# Patient Record
Sex: Male | Born: 1950 | Race: White | Hispanic: No | Marital: Married | Smoking: Never smoker
Health system: Southern US, Academic
[De-identification: ages and names within clinical notes are randomized; demographics above are authoritative.]

## PROBLEM LIST (undated history)

## (undated) DIAGNOSIS — E559 Vitamin D deficiency, unspecified: Secondary | ICD-10-CM

## (undated) DIAGNOSIS — F411 Generalized anxiety disorder: Secondary | ICD-10-CM

## (undated) DIAGNOSIS — E119 Type 2 diabetes mellitus without complications: Secondary | ICD-10-CM

## (undated) DIAGNOSIS — M159 Polyosteoarthritis, unspecified: Secondary | ICD-10-CM

## (undated) DIAGNOSIS — I10 Essential (primary) hypertension: Secondary | ICD-10-CM

## (undated) DIAGNOSIS — R55 Syncope and collapse: Secondary | ICD-10-CM

## (undated) DIAGNOSIS — E785 Hyperlipidemia, unspecified: Secondary | ICD-10-CM

## (undated) DIAGNOSIS — G4700 Insomnia, unspecified: Secondary | ICD-10-CM

## (undated) DIAGNOSIS — F32A Depression, unspecified: Secondary | ICD-10-CM

## (undated) DIAGNOSIS — R972 Elevated prostate specific antigen [PSA]: Secondary | ICD-10-CM

## (undated) DIAGNOSIS — E291 Testicular hypofunction: Secondary | ICD-10-CM

## (undated) DIAGNOSIS — N400 Enlarged prostate without lower urinary tract symptoms: Secondary | ICD-10-CM

## (undated) HISTORY — DX: Hemochromatosis, unspecified: E83.119

## (undated) HISTORY — DX: Generalized anxiety disorder: F41.1

## (undated) HISTORY — DX: Benign prostatic hyperplasia without lower urinary tract symptoms: N40.0

## (undated) HISTORY — PX: FINGER SURGERY: SHX640

## (undated) HISTORY — DX: Polyosteoarthritis, unspecified: M15.9

## (undated) HISTORY — DX: Insomnia, unspecified: G47.00

## (undated) HISTORY — DX: Type 2 diabetes mellitus without complications: E11.9

## (undated) HISTORY — DX: Depression, unspecified: F32.A

## (undated) HISTORY — DX: Vitamin D deficiency, unspecified: E55.9

## (undated) HISTORY — DX: Hypomagnesemia: E83.42

## (undated) HISTORY — DX: Testicular hypofunction: E29.1

## (undated) HISTORY — PX: HAND SURGERY: SHX662

## (undated) HISTORY — DX: Essential (primary) hypertension: I10

## (undated) HISTORY — DX: Hyperlipidemia, unspecified: E78.5

## (undated) HISTORY — DX: Elevated prostate specific antigen (PSA): R97.20

## (undated) HISTORY — PX: HX APPENDECTOMY: SHX54

## (undated) HISTORY — DX: Syncope and collapse: R55

---

## 1999-10-06 ENCOUNTER — Other Ambulatory Visit (HOSPITAL_COMMUNITY): Payer: Self-pay

## 2017-06-23 HISTORY — PX: CYSTOSCOPY: SUR368

## 2020-09-17 LAB — ENTER/EDIT EXTERNAL COMMON LAB RESULTS
CHOLESTEROL: 65
HDL-CHOLESTEROL: 39
HEMOGLOBIN A1C: 6.4
LDL (CALCULATED): 102
LDL CHOLESTEROL,DIRECT: 24
TRIGLYCERIDES: 122

## 2020-10-10 ENCOUNTER — Encounter (INDEPENDENT_AMBULATORY_CARE_PROVIDER_SITE_OTHER): Payer: Self-pay

## 2020-10-17 ENCOUNTER — Encounter (INDEPENDENT_AMBULATORY_CARE_PROVIDER_SITE_OTHER): Payer: Medicare Other | Admitting: Hematology & Oncology

## 2020-10-22 ENCOUNTER — Encounter (INDEPENDENT_AMBULATORY_CARE_PROVIDER_SITE_OTHER): Payer: Self-pay | Admitting: Hematology & Oncology

## 2020-10-22 ENCOUNTER — Other Ambulatory Visit: Payer: Self-pay

## 2020-10-22 ENCOUNTER — Ambulatory Visit (INDEPENDENT_AMBULATORY_CARE_PROVIDER_SITE_OTHER): Payer: Medicare Other | Admitting: Hematology & Oncology

## 2020-10-22 NOTE — Nursing Note (Signed)
Here for follow up no issues

## 2020-10-23 NOTE — Cancer Center Note (Addendum)
HEMATOLOGY/ONCOLOGY, [facility], [facility]   [address]  [place] [address]  [facility]     Progress Note    Name: Steven Henderson MRN:  [id number]   Date: 10/22/2020 DOB: 10-Jul-1950       Referring Physician: Rogers Seeds, MD  Primary Care Provider: Eduard Clos, DO    Chief Complaint   Patient presents with   . Follow Up     hemochromatosis       Subjective:  Hematologic follow-up of patient with hemochromatosis.  The patient is on Q 3 month evaluation for phlebotomies.  His last phlebotomy February 2022.  He is doing well.    ROS:     Review of Systems   All other systems reviewed and are negative.       Objective:   BP (!) 159/80 (Site: Right, Patient Position: Sitting, Cuff Size: Adult)   Pulse 64   Temp 36.7 C (98.1 F) (Skin)   Ht 1.727 m (5\' 8" )   Wt 98.9 kg (218 lb)   BMI 33.15 kg/m       Physical Exam  Vitals reviewed.   Constitutional:       Appearance: Normal appearance.   HENT:      Nose: Nose normal.   Eyes:      Extraocular Movements: Extraocular movements intact.      Conjunctiva/sclera: Conjunctivae normal.      Pupils: Pupils are equal, round, and reactive to light.   Cardiovascular:      Rate and Rhythm: Normal rate and regular rhythm.      Pulses: Normal pulses.      Heart sounds: Normal heart sounds.   Pulmonary:      Effort: Pulmonary effort is normal.      Breath sounds: Normal breath sounds.   Abdominal:      General: Abdomen is flat. Bowel sounds are normal.      Palpations: Abdomen is soft.   Musculoskeletal:         General: Normal range of motion.      Cervical back: Neck supple.   Skin:     General: Skin is warm and dry.   Neurological:      General: No focal deficit present.      Mental Status: He is alert and oriented to person, place, and time. Mental status is at baseline.   Psychiatric:         Mood and Affect: Mood normal.          ECOG Status: 0 - Fully active, able to carry on all  pre-disease performance without restriction.       Current Outpatient Medications   Medication Sig   . amLODIPine (NORVASC) 10 mg Oral Tablet    . ergocalciferol, vitamin D2, (DRISDOL) 1,250 mcg (50,000 unit) Oral Capsule    . finasteride (PROSCAR) 5 mg Oral Tablet    . glimepiride (AMARYL) 2 mg Oral Tablet    . meloxicam (MOBIC) 15 mg Oral Tablet    . metFORMIN (GLUCOPHAGE XR) 500 mg Oral Tablet Sustained Release 24 hr    . metoprolol succinate (TOPROL-XL) 50 mg Oral Tablet Sustained Release 24 hr    . naproxen (NAPROSYN) 500 mg Oral Tablet    . olmesartan (BENICAR) 40 mg Oral Tablet    . tamsulosin (FLOMAX) 0.4 mg Oral Capsule      Allergies as of 10/22/2020   . (No Known Allergies)  Labs:  07/09/2020 we have electrolytes within normal limits BUN of 18 creatinine 1.1 iron of 62  Saturation of 15% ferritin of 65  White count 7300 hemoglobin of 11.1 hematocrit 33.2 and a platelet count of 238000  Alpha fetoprotein of 2.4    Radiology:       Assessment:     1. 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 BPH    3. Hypertension      Plan:     The patient will continue to have CBC liver profile iron studies ferritin q.3 months with instruction to have phlebotomy 500 cc of whole blood for ferritin greater than 50. He needs a sonogram of the liver.  I will see him back in 6 months .  He was given the request forms for all the aforementioned lab in series.      A total of 20 minutes was spent on this consultation with >50% spent in education, counseling of patient on diagnosis and treatment and in coordination of care.  All questions presented by patient on this visit were satisfactorily answered.      Return in about 6 months (around 04/23/2021).    Storm Frisk, MD      CC:  PCP General:  Eduard Clos, DO  [address]  [address]  [place] [address]      Portions of this note may be dictated using voice recognition software or a dictation service. Variances in spelling and vocabulary are possible and unintentional. Not all errors are caught/corrected. Please notify the Thereasa Parkin if any discrepancies are noted or if the meaning of any statement is not clear.

## 2020-11-01 ENCOUNTER — Telehealth (INDEPENDENT_AMBULATORY_CARE_PROVIDER_SITE_OTHER): Payer: Self-pay | Admitting: Hematology & Oncology

## 2020-11-01 NOTE — Telephone Encounter (Signed)
LM on VM that US liver is negative except for fatty changes

## 2021-04-07 LAB — ENTER/EDIT EXTERNAL COMMON LAB RESULTS: PROSTATE SPECIFIC AG: 17.85

## 2021-04-23 ENCOUNTER — Ambulatory Visit (INDEPENDENT_AMBULATORY_CARE_PROVIDER_SITE_OTHER): Payer: Medicare Other

## 2021-04-23 ENCOUNTER — Other Ambulatory Visit: Payer: Self-pay

## 2021-04-23 ENCOUNTER — Encounter (INDEPENDENT_AMBULATORY_CARE_PROVIDER_SITE_OTHER): Payer: Self-pay

## 2021-04-23 NOTE — H&P (Signed)
[facility]       CANCER CENTER NOTE     Date: 04/23/2021  Name: Steven Henderson  MRN: [id number]  Referring Physician: Netty Starring, MD  Primary Care Provider: Adriana Mccallum, DO    REASON FOR VISIT:   Chief Complaint   Patient presents with    Hemachromatosis        HISTORY OF PRESENT ILLNESS:   70 y.o.male from [place] [id number] for evaluation and management of hemochromatosis.  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    He has been undergoing phlebotomy treatments which he has been tolerating very well.  Comes today for his routine blood work assessment.    His last phlebotomy was in February 2022 which she tolerated really well.  He did not need a phlebotomy after that because of low ferritin.    States that recently his PCP checked PSA level which was elevated at 17.  He was referred to Urology Dr. Anette Guarneri who saw him and ordered pelvic MRI.  The pelvic MRI is scheduled to be done in a week.  He will follow-up with Dr. Anette Guarneri on 21st of December to talk over the results of MRI.  States that for the last year has been having problems with urinary incontinence and frequent urination at night.  He has to wake up twice a night to use the restroom.  Complains of slow urinary stream.     REVIEW OF SYSTEMS:  Pertinent review of systems as discussed in HPI   PAST MEDICAL HISTORY:  Past Medical History:   Diagnosis Date    BPH (benign prostatic hyperplasia)     Hemochromatosis     Hypertension          MEDICATIONS:  Current Outpatient Medications   Medication Sig    amLODIPine (NORVASC) 10 mg Oral Tablet     ergocalciferol, vitamin D2, (DRISDOL) 1,250 mcg (50,000 unit) Oral Capsule     finasteride (PROSCAR) 5 mg Oral Tablet     glimepiride (AMARYL) 2 mg Oral Tablet     meloxicam (MOBIC) 15 mg Oral Tablet     metFORMIN (GLUCOPHAGE XR) 500 mg Oral Tablet Sustained Release 24 hr      metoprolol succinate (TOPROL-XL) 50 mg Oral Tablet Sustained Release 24 hr     naproxen (NAPROSYN) 500 mg Oral Tablet     olmesartan (BENICAR) 40 mg Oral Tablet     tamsulosin (FLOMAX) 0.4 mg Oral Capsule      ALLERGIES:  No Known Allergies  PAST SURGICAL HISTORY:  Past Surgical History:   Procedure Laterality Date    HX APPENDECTOMY           SOCIAL HISTORY:  Social History     Tobacco Use   Smoking Status Never   Smokeless Tobacco Never      E-CigaretteVaping History:  E-Cigarette/Vaping Use: Never User      Substances vaped:  Nicotine: No  CBD: No      Additional information:  Disposable: No  Refillable Tank: No     FAMILY HISTORY:  Family Medical History:    None           PHYSICAL EXAMINATION:  Vitals:  Vitals:    04/23/21 1106   BP: 134/65   Pulse: 90   Temp: 36.7 C (98.1 F)   TempSrc: Temporal   Weight: 98.3 kg (216 lb 11.2 oz)  Height: 1.727 m (5\' 8" )   BMI: 33.02         Body mass index is 32.95 kg/m.    ECOG PS 0    Physical Exam  Constitutional:       Appearance: Normal appearance.   HENT:      Head: Normocephalic and atraumatic.      Nose: Nose normal.      Mouth/Throat:      Mouth: Mucous membranes are moist.      Pharynx: Oropharynx is clear.   Eyes:      Pupils: Pupils are equal, round, and reactive to light.   Cardiovascular:      Rate and Rhythm: Normal rate and regular rhythm.      Pulses: Normal pulses.      Heart sounds: Normal heart sounds.   Pulmonary:      Effort: Pulmonary effort is normal.      Breath sounds: Normal breath sounds.   Abdominal:      General: Abdomen is flat. Bowel sounds are normal.      Palpations: Abdomen is soft.   Musculoskeletal:         General: Normal range of motion.      Cervical back: Normal range of motion and neck supple.   Neurological:      General: No focal deficit present.      Mental Status: He is alert and oriented to person, place, and time.   Psychiatric:         Mood and Affect: Mood normal.            LABORATORY:    Recent labs from 04/07/2021  show a WBC count of 6.6 hemoglobin 15.7 hematocrit 41.8 platelet count of 177  Creatinine is 1.3 calcium of liver function tests are all normal ferritin level is 34   PSA screen shows a PSA of 17.85       IMAGING:   Ultrasound of liver shows normal results 6/22.  The results were discussed with the patient a copy of the ultrasound report was given to the patient.       ASSESSMENT:     ICD-10-CM    1. Hemochromatosis, unspecified hemochromatosis type  E83.119 CBC/DIFF     FERRITIN     COMPREHENSIVE METABOLIC PANEL, NON-FASTING     PSA, DIAGNOSTIC         PLAN:    1.  Hereditary hemochromatosis with combined heterozygosity C282Y/ H 63 D  With no end-organ damage to heart, liver or pancreas.  The ferritin today does not require a phlebotomy.  Follow-up in 6 months with a repeat ferritin level.    2. Elevated PSA level.  Follow-up with urology for MRI of pelvis and a biopsy of the prostate if needed.  Follow-up in Oncology if needed.      Orders Placed This Encounter    CBC/DIFF    FERRITIN    COMPREHENSIVE METABOLIC PANEL, NON-FASTING    PSA, DIAGNOSTIC        7/22, MD  04/23/2021, 12:06     This note was partially generated using MModal Fluency Direct system, and there may be some incorrect words, spellings, and punctuation that were not noted in checking the note before saving.

## 2021-04-23 NOTE — Patient Instructions (Signed)
No phlebotomy today.  F/U with Urology for prostate imaging.  Return in 6 months with labs

## 2021-10-14 ENCOUNTER — Encounter (INDEPENDENT_AMBULATORY_CARE_PROVIDER_SITE_OTHER): Payer: Self-pay | Admitting: Internal Medicine

## 2021-10-14 ENCOUNTER — Ambulatory Visit (INDEPENDENT_AMBULATORY_CARE_PROVIDER_SITE_OTHER): Payer: Medicare Other | Admitting: Internal Medicine

## 2021-10-14 ENCOUNTER — Other Ambulatory Visit: Payer: Self-pay

## 2021-10-14 ENCOUNTER — Inpatient Hospital Stay
Admission: RE | Admit: 2021-10-14 | Discharge: 2021-10-14 | Disposition: A | Discharge status: Home / Self Care | Payer: Medicare Other | Source: Ambulatory Visit | Attending: Internal Medicine | Admitting: Internal Medicine

## 2021-10-14 ENCOUNTER — Other Ambulatory Visit (HOSPITAL_COMMUNITY): Payer: Medicare Other

## 2021-10-14 VITALS — BP 150/80 | HR 81 | Ht 68.0 in | Wt 211.4 lb

## 2021-10-14 DIAGNOSIS — I10 Essential (primary) hypertension: Secondary | ICD-10-CM | POA: Insufficient documentation

## 2021-10-14 DIAGNOSIS — G4700 Insomnia, unspecified: Secondary | ICD-10-CM | POA: Insufficient documentation

## 2021-10-14 DIAGNOSIS — G8929 Other chronic pain: Secondary | ICD-10-CM

## 2021-10-14 DIAGNOSIS — N400 Enlarged prostate without lower urinary tract symptoms: Secondary | ICD-10-CM | POA: Insufficient documentation

## 2021-10-14 DIAGNOSIS — F5101 Primary insomnia: Secondary | ICD-10-CM

## 2021-10-14 DIAGNOSIS — N401 Enlarged prostate with lower urinary tract symptoms: Secondary | ICD-10-CM

## 2021-10-14 DIAGNOSIS — M159 Polyosteoarthritis, unspecified: Secondary | ICD-10-CM

## 2021-10-14 DIAGNOSIS — F339 Major depressive disorder, recurrent, unspecified: Secondary | ICD-10-CM | POA: Insufficient documentation

## 2021-10-14 DIAGNOSIS — F331 Major depressive disorder, recurrent, moderate: Secondary | ICD-10-CM

## 2021-10-14 DIAGNOSIS — R338 Other retention of urine: Secondary | ICD-10-CM

## 2021-10-14 DIAGNOSIS — M546 Pain in thoracic spine: Secondary | ICD-10-CM | POA: Insufficient documentation

## 2021-10-14 DIAGNOSIS — R5383 Other fatigue: Secondary | ICD-10-CM | POA: Insufficient documentation

## 2021-10-14 DIAGNOSIS — R972 Elevated prostate specific antigen [PSA]: Secondary | ICD-10-CM

## 2021-10-14 DIAGNOSIS — E785 Hyperlipidemia, unspecified: Secondary | ICD-10-CM | POA: Insufficient documentation

## 2021-10-14 DIAGNOSIS — E782 Mixed hyperlipidemia: Secondary | ICD-10-CM

## 2021-10-14 DIAGNOSIS — E119 Type 2 diabetes mellitus without complications: Secondary | ICD-10-CM

## 2021-10-14 DIAGNOSIS — R200 Anesthesia of skin: Secondary | ICD-10-CM

## 2021-10-14 DIAGNOSIS — E538 Deficiency of other specified B group vitamins: Secondary | ICD-10-CM

## 2021-10-14 DIAGNOSIS — R202 Paresthesia of skin: Secondary | ICD-10-CM

## 2021-10-14 DIAGNOSIS — F32A Depression, unspecified: Secondary | ICD-10-CM | POA: Insufficient documentation

## 2021-10-14 DIAGNOSIS — F411 Generalized anxiety disorder: Secondary | ICD-10-CM

## 2021-10-14 LAB — URINALYSIS, MACRO/MICRO
BILIRUBIN: NEGATIVE mg/dL
BLOOD: NEGATIVE mg/dL
GLUCOSE: NEGATIVE mg/dL
KETONES: NEGATIVE mg/dL
LEUKOCYTES: NEGATIVE WBCs/uL
NITRITE: NEGATIVE
PH: 5.5 (ref 5.0–9.0)
PROTEIN: 20 mg/dL
SPECIFIC GRAVITY: 1.024 (ref 1.002–1.030)
UROBILINOGEN: NORMAL mg/dL

## 2021-10-14 LAB — LIPID PANEL
CHOL/HDL RATIO: 4.1
CHOLESTEROL: 181 mg/dL (ref <200)
HDL CHOL: 44 mg/dL (ref 23–92)
LDL CALC: 98 mg/dL (ref 0–100)
TRIGLYCERIDES: 194 mg/dL — ABNORMAL HIGH (ref <=150)
VLDL CALC: 39 mg/dL (ref 0–50)

## 2021-10-14 LAB — CBC
HCT: 45.7 % (ref 42.0–51.0)
HGB: 15.9 g/dL (ref 13.5–18.0)
MCH: 30.7 pg (ref 27.0–32.0)
MCHC: 34.7 g/dL (ref 32.0–36.0)
MCV: 88.4 fL (ref 78.0–99.0)
MPV: 9.4 fL (ref 7.4–10.4)
PLATELETS: 183 10*3/uL (ref 140–440)
RBC: 5.17 10*6/uL (ref 4.20–6.00)
RDW: 13.9 % (ref 11.6–14.8)
WBC: 9.1 10*3/uL (ref 4.0–10.5)
WBCS UNCORRECTED: 9.1 10*3/uL

## 2021-10-14 LAB — PSA, DIAGNOSTIC: PSA: 1.76 ng/mL (ref <=4.00)

## 2021-10-14 LAB — COMPREHENSIVE METABOLIC PANEL, NON-FASTING
ALBUMIN/GLOBULIN RATIO: 1.4 (ref 0.8–1.4)
ALBUMIN: 4.6 g/dL (ref 3.5–5.7)
ALKALINE PHOSPHATASE: 59 U/L (ref 34–104)
ALT (SGPT): 33 U/L (ref 7–52)
ANION GAP: 9 mmol/L — ABNORMAL LOW (ref 10–20)
AST (SGOT): 26 U/L (ref 13–39)
BILIRUBIN TOTAL: 0.8 mg/dL (ref 0.3–1.2)
BUN/CREA RATIO: 17 (ref 6–22)
BUN: 19 mg/dL (ref 7–25)
CALCIUM, CORRECTED: 9.8 mg/dL (ref 8.9–10.8)
CALCIUM: 10.4 mg/dL — ABNORMAL HIGH (ref 8.6–10.3)
CHLORIDE: 105 mmol/L (ref 98–107)
CO2 TOTAL: 26 mmol/L (ref 21–31)
CREATININE: 1.13 mg/dL (ref 0.60–1.30)
ESTIMATED GFR: 70 mL/min/{1.73_m2} (ref >59)
GLOBULIN: 3.3 (ref 2.9–5.4)
GLUCOSE: 86 mg/dL (ref 74–109)
OSMOLALITY, CALCULATED: 281 mOsm/kg (ref 270–290)
POTASSIUM: 3.9 mmol/L (ref 3.5–5.1)
PROTEIN TOTAL: 7.9 g/dL (ref 6.4–8.9)
SODIUM: 140 mmol/L (ref 136–145)

## 2021-10-14 LAB — THYROID STIMULATING HORMONE WITH FREE T4 REFLEX: TSH: 2.858 u[IU]/mL (ref 0.450–5.330)

## 2021-10-14 LAB — VITAMIN B12: VITAMIN B 12: 720 pg/mL (ref 180–914)

## 2021-10-14 MED ORDER — DULOXETINE 30 MG CAPSULE,DELAYED RELEASE
30.0000 mg | DELAYED_RELEASE_CAPSULE | Freq: Every day | ORAL | 3 refills | Status: DC
Start: 2021-10-14 — End: 2022-02-04

## 2021-10-14 MED ORDER — ROSUVASTATIN 10 MG TABLET
10.0000 mg | ORAL_TABLET | Freq: Every evening | ORAL | 4 refills | Status: DC
Start: 2021-10-14 — End: 2022-09-07

## 2021-10-14 NOTE — Progress Notes (Signed)
INTERNAL MEDICINE, [facility]  [address]  [place] [address]       Name: Steven Henderson MRN:  [id number]   Date: 10/14/2021 Age: 71 y.o.       Chief Complaint:    Chief Complaint   Patient presents with   . Right Arm Numbness/Tingling     New pt getting established. Was previous patient of Dr. Laurena Slimmer. He said for the the past 4 months he has been having right arm numbness. Has been seen at the ER x 2. He said his last visit there they did an MRI that showed he has two bulging disc, was referred to physical therapy but was told they did not have the right diagnose for insurance to pay. Is now having mid center back pain and burning.        HPI:  Steven Henderson is a 71 y.o. male who comes in for an initial evaluation with me as a first-time patient.  He used to see Dr. Ann Maki and he actually has multiple concerns.  His biggest concern is that he has been having problems with numbness and tingling especially located of his right upper extremity and this has been present on several visits where he has been to the emergency department.  They performed a CT scan of the head and an MRI of the neck and he stated that he was told that he had a bulging disc in his neck.  He was sent for physical therapy and physical therapy could not elicit any physical exam findings that was suggestive of a bulging disc.  He has been having persistent symptoms that he states will be anywhere from every 2-3 weeks and he is concerned as to its etiology.  He even has noticed some increasing weakness from time to time of his right hand after these episodes.  They tend to be more in the morning and improves throughout the day and usually takes about 2-3 hours before it resolves.  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  He is feeling that it is more just a nuisance and he has been  taking meloxicam and Naprosyn on a daily basis.  He states that this does help but we discussed the fact that these were duplicate therapies and we may need to do some changes.  He is on a baby aspirin which he takes routinely and other than this for his diabetes he is taking glimepiride in addition to metformin.  He states that he is noticed that home that his blood pressure on a calibrated blood pressure cuff has been elevated into the 140s 160s systolic and he is concerned that his medicines may not be working well for him.  He is never been on a statin in the past for his diabetes and we have discussed this also.  He does have a history of hemochromatosis and this has been controlled very well and he is not had have any phlebotomies to be performed in it typically is just monitor with blood work.  Other than this he is denying any chest pain or chest pressure denies any nausea or vomiting no diarrhea constipation hematochezia or melena.  He has been having some increasing depression however need describes the depression is being moderate to severity and he is wanting to know if there is any medications that he could possibly  Past Medical History:  Past Medical History:   Diagnosis Date   . Anxiety state    . BPH (benign prostatic hyperplasia)    . Depression, acute    . Diabetes mellitus, type 2 (CMS HCC)    . Elevated PSA    . Generalized OA    . Hemochromatosis    . Hyperlipidemia    . Hypertension    . Hypomagnesemia    . Insomnia    . Testicular hypofunction    . Vitamin D deficiency          Past Surgical History:   Procedure Laterality Date   . CYSTOSCOPY  06/23/2017   . HX APPENDECTOMY        Current Outpatient Medications   Medication Sig   . amLODIPine (NORVASC) 10 mg Oral Tablet    . aspirin (ECOTRIN) 81 mg Oral Tablet, Delayed Release (E.C.) Take 1 Tablet (81 mg total) by mouth Once a day   . cyanocobalamin (VITAMIN B12) 2,500 mcg Sublingual Tablet, Sublingual Place 1 Tablet (2,500 mcg total)  under the tongue Once a day   . DULoxetine (CYMBALTA DR) 30 mg Oral Capsule, Delayed Release(E.C.) Take 1 Capsule (30 mg total) by mouth Once a day for 120 days Indications: chronic muscle or bone pain   . ergocalciferol, vitamin D2, (DRISDOL) 1,250 mcg (50,000 unit) Oral Capsule    . finasteride (PROSCAR) 5 mg Oral Tablet    . glimepiride (AMARYL) 2 mg Oral Tablet    . meloxicam (MOBIC) 15 mg Oral Tablet    . metFORMIN (GLUCOPHAGE) 500 mg Oral Tablet Take 2 Tablets (1,000 mg total) by mouth Twice daily with food   . metoprolol succinate (TOPROL-XL) 50 mg Oral Tablet Sustained Release 24 hr    . olmesartan (BENICAR) 40 mg Oral Tablet  (Patient not taking: Reported on 10/14/2021)   . rosuvastatin (CRESTOR) 10 mg Oral Tablet Take 1 Tablet (10 mg total) by mouth Every evening   . tamsulosin (FLOMAX) 0.4 mg Oral Capsule      No Known Allergies    Family History:  Family Medical History:     Problem Relation (Age of Onset)    Liver Cancer Father    Stroke Mother            Social History:   Social History     Tobacco Use   Smoking Status Never   Smokeless Tobacco Never     Social History     Substance and Sexual Activity   Alcohol Use Never     Social History     Occupational History   . Not on file       Review of Systems:  Review of systems as discussed in HPI      Problem List:  Patient Active Problem List   Diagnosis   . Anxiety state   . BPH (benign prostatic hyperplasia)   . Depression, acute   . Diabetes mellitus, type 2 (CMS HCC)   . Elevated PSA   . Generalized OA   . Hemochromatosis   . Hyperlipidemia   . Hypertension   . Hypomagnesemia   . Insomnia   . Major depression, recurrent (CMS HCC)   . Chronic bilateral thoracic back pain       Physical Examination:  BP (!) 150/80 (Site: Right, Patient Position: Sitting, Cuff Size: Adult)   Pulse 81   Ht 1.727 m (5\' 8" )   Wt 95.9 kg (211 lb 6.4 oz)   SpO2 95%  BMI 32.14 kg/m       Physical Exam  Vitals and nursing note reviewed.   Constitutional:       General:  He is not in acute distress.     Appearance: Normal appearance.   HENT:      Head: Normocephalic.      Right Ear: Tympanic membrane normal.      Left Ear: Tympanic membrane normal.      Nose: Nose normal.      Mouth/Throat:      Mouth: Mucous membranes are moist.   Eyes:      General: No scleral icterus.     Extraocular Movements: Extraocular movements intact.      Conjunctiva/sclera: Conjunctivae normal.      Pupils: Pupils are equal, round, and reactive to light.   Neck:      Vascular: No carotid bruit.   Cardiovascular:      Rate and Rhythm: Normal rate and regular rhythm.      Pulses: Normal pulses.           Dorsalis pedis pulses are 2+ on the right side and 2+ on the left side.        Posterior tibial pulses are 2+ on the right side and 2+ on the left side.      Heart sounds: Normal heart sounds.   Pulmonary:      Effort: Pulmonary effort is normal.      Breath sounds: Normal breath sounds. No wheezing, rhonchi or rales.   Abdominal:      General: Abdomen is flat. Bowel sounds are normal.      Tenderness: There is no abdominal tenderness. There is no guarding.   Musculoskeletal:         General: No swelling. Normal range of motion.      Cervical back: Normal range of motion. No tenderness.      Right lower leg: No edema.      Left lower leg: No edema.   Skin:     General: Skin is warm.      Capillary Refill: Capillary refill takes less than 2 seconds.      Findings: Ecchymosis present.   Neurological:      General: No focal deficit present.      Mental Status: He is alert and oriented to person, place, and time.      Cranial Nerves: No cranial nerve deficit.        Diabetic foot exam:        Data Reviewed:  Pending Evaluation            Health Maintenance:  Health Maintenance   Topic Date Due   . Hepatitis C screening  Never done   . Depression Screening  Never done   . Adult Tdap-Td (1 - Tdap) Never done   . Colonoscopy  Never done   . Shingles Vaccine (1 of 2) Never done   . Pneumococcal Vaccination, Age 42+  (1 - PCV) Never done   . Covid-19 Vaccine (4 - Booster for Moderna series) 06/05/2020   . Annual Wellness Visit  Never done   . Influenza Vaccine (Season Ended) 01/16/2022   . Meningococcal Vaccine  Aged Out        Assessment/Plan:      ICD-10-CM    1. 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 Anxiety state  F41.1  3. Diabetes mellitus, type 2 (CMS HCC)  E11.9 HGA1C (HEMOGLOBIN A1C WITH EST AVG GLUCOSE)      4. Primary hypertension  I10 CBC     COMPREHENSIVE METABOLIC PANEL, NON-FASTING     URINALYSIS WITH REFLEX MICROSCOPIC AND CULTURE IF POSITIVE     rosuvastatin (CRESTOR) 10 mg Oral Tablet      5. Mixed hyperlipidemia  E78.2 LIPID PANEL     rosuvastatin (CRESTOR) 10 mg Oral Tablet      6. Benign prostatic hyperplasia with urinary retention  N40.1 PSA, DIAGNOSTIC    R33.8       7. Depression, acute  F32.A       8. Elevated PSA  R97.20       9. Generalized OA  M15.9       10. Hereditary hemochromatosis (CMS HCC)  E83.110       11. 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 Fatigue, unspecified type  R53.83 THYROID STIMULATING HORMONE WITH FREE T4 REFLEX      14. B12 deficiency  E53.8 VITAMIN B12      15. Numbness and tingling of right arm  R20.0 CAROTID ARTERY DUPLEX    R20.2 MRI BRAIN WO CONTRAST      16. Chronic bilateral thoracic back pain  M54.6 XR THORACIC SPINE    G89.29          Orders Placed This Encounter   . MRI BRAIN WO CONTRAST   . XR THORACIC SPINE   . CBC   . COMPREHENSIVE METABOLIC PANEL, NON-FASTING   . VITAMIN B12   . THYROID STIMULATING HORMONE WITH FREE T4 REFLEX   . PSA, DIAGNOSTIC   . HGA1C (HEMOGLOBIN A1C WITH EST AVG GLUCOSE)   . LIPID PANEL   . URINALYSIS WITH REFLEX MICROSCOPIC AND CULTURE IF POSITIVE   . CAROTID ARTERY DUPLEX   . DULoxetine (CYMBALTA DR) 30 mg Oral Capsule, Delayed Release(E.C.)   . rosuvastatin (CRESTOR) 10 mg Oral Tablet       PLAN:    The patient is going to be placed on Crestor 10 mg 1 p.o. daily for not only his diabetes but also his right arm numbness until we can determine the cause of this.  I am going to order an MRI of the brain as CT scan was essentially negative and I am also going to order carotid Dopplers other bilateral neck region a full set of labs is going to be ordered and he is going to continue to be on his aspirin.  I have told him that he should discontinue either the Naprosyn or the Mobic as both of these are dual therapy and that we will start Cymbalta 30 mg 1 p.o. daily to help not only with his mood but also pain.  I have also told him that we will order a thoracic spine x-ray in regards to the pain that he is having in his thoracic spine as I feel that this is going to be musculoskeletal more arthritic in nature.  He states that he will discontinue the naproxen currently and see if this will help I just taking the Mobic and the Cymbalta.  Is going to follow up in the office in 1 month and once labs and ancillary testing are performed then I will contact him before his next visit he understands this.  Follow up:  Return in about 1 month (around 11/14/2021) for In Person  Visit.    This note was partially created using voice recognition software and is inherently subject to errors including those of syntax and "sound alike " substitutions which may escape proof reading.  In such instances, original meaning may be extrapolated by contextual derivation.    Lucia Gaskins, DO  10/14/2021, 15:57

## 2021-10-15 ENCOUNTER — Telehealth (INDEPENDENT_AMBULATORY_CARE_PROVIDER_SITE_OTHER): Payer: Self-pay | Admitting: Internal Medicine

## 2021-10-15 DIAGNOSIS — R790 Abnormal level of blood mineral: Secondary | ICD-10-CM

## 2021-10-15 LAB — HGA1C (HEMOGLOBIN A1C WITH EST AVG GLUCOSE): HEMOGLOBIN A1C: 6.8 % — ABNORMAL HIGH (ref 4.0–6.0)

## 2021-10-15 NOTE — Telephone Encounter (Signed)
Per Dr. Tamala Julian - Let patient know that his x-ray of the thoracic spine does reveal significant degenerative changes that is probably causing some of his pain. We will discuss on his next visit and he needs to have a hydrated basic metabolic profile to be performed within a 1-2 week.

## 2021-10-16 ENCOUNTER — Telehealth (INDEPENDENT_AMBULATORY_CARE_PROVIDER_SITE_OTHER): Payer: Self-pay | Admitting: Internal Medicine

## 2021-10-16 DIAGNOSIS — G589 Mononeuropathy, unspecified: Secondary | ICD-10-CM

## 2021-10-16 NOTE — Telephone Encounter (Signed)
PT WIFE WOULD LIKE THE RESULTS OF His XRAY

## 2021-10-16 NOTE — Telephone Encounter (Signed)
The patient's thoracic spine x-ray shows generalized osteoarthritis which could be causing some of his symptoms as far as the pain in his right scapula shoulder region.  The MRI of the brain is still pending

## 2021-10-21 ENCOUNTER — Other Ambulatory Visit: Payer: Self-pay

## 2021-10-21 ENCOUNTER — Inpatient Hospital Stay
Admission: RE | Admit: 2021-10-21 | Discharge: 2021-10-21 | Disposition: A | Discharge status: Home / Self Care | Payer: Medicare Other | Source: Ambulatory Visit | Attending: Internal Medicine | Admitting: Internal Medicine

## 2021-10-21 DIAGNOSIS — R200 Anesthesia of skin: Secondary | ICD-10-CM | POA: Insufficient documentation

## 2021-10-21 DIAGNOSIS — R202 Paresthesia of skin: Secondary | ICD-10-CM | POA: Insufficient documentation

## 2021-10-21 NOTE — Addendum Note (Signed)
Addended by: Jeannine Boga on: 10/21/2021 08:47 AM     Modules accepted: Orders

## 2021-10-23 ENCOUNTER — Other Ambulatory Visit (HOSPITAL_COMMUNITY): Payer: Medicare Other

## 2021-10-23 ENCOUNTER — Ambulatory Visit: Payer: Medicare Other | Attending: Hematology & Oncology | Admitting: Hematology & Oncology

## 2021-10-23 ENCOUNTER — Other Ambulatory Visit: Payer: Self-pay

## 2021-10-23 ENCOUNTER — Encounter (INDEPENDENT_AMBULATORY_CARE_PROVIDER_SITE_OTHER): Payer: Self-pay | Admitting: Hematology & Oncology

## 2021-10-23 DIAGNOSIS — Z800 Family history of malignant neoplasm of digestive organs: Secondary | ICD-10-CM | POA: Insufficient documentation

## 2021-10-23 DIAGNOSIS — R972 Elevated prostate specific antigen [PSA]: Secondary | ICD-10-CM | POA: Insufficient documentation

## 2021-10-23 LAB — IRON TRANSFERRIN AND TIBC
IRON (TRANSFERRIN) SATURATION: 39 % (ref 20–50)
IRON: 150 ug/dL (ref 50–212)
TOTAL IRON BINDING CAPACITY: 384 ug/dL (ref 250–450)
TRANSFERRIN: 274 mg/dL (ref 203–362)
UIBC: 234 ug/dL (ref 130–375)

## 2021-10-23 LAB — FERRITIN: FERRITIN: 18 ng/mL (ref 11–336)

## 2021-10-23 NOTE — Progress Notes (Signed)
[facility]       [facility]       CANCER CENTER NOTE     Date: 10/23/2021  Name: Steven Henderson  MRN: [id number]  Referring Physician: Rogers SeedsPatel, Kamalesh, MD  Primary Care Provider: Lucia Gaskinsodd A Smith, DO    REASON FOR VISIT:   Chief Complaint   Patient presents with   . Hemachromatosis        HISTORY OF PRESENT ILLNESS:   70 [facility] [place] [phone] for evaluation and management of hemochromatosis.  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    He has been undergoing phlebotomy treatments which he has been tolerating very well.  Comes today for his routine blood work assessment.    His last phlebotomy was in February 2022 which he tolerated really well.  He did not need a phlebotomy after that because of low ferritin.    States that recently his PCP checked PSA level which was elevated at 17.  He was referred to Urology Dr. Ewing SchleinPervaiz who saw him and ordered pelvic MRI.  The pelvic MRI is scheduled to be done in a week.  He will follow-up with Dr. Ewing SchleinPervaiz on 21st of December to talk over the results of MRI.  States that for the last year has been having problems with urinary incontinence and frequent urination at night.  He has to wake up twice a night to use the restroom.  Complains of slow urinary stream.   10/23/21;  Patient is seen for follow up. Denies anorexia, weight loss, arthralgias, abdominal pain, n/v. He has mild fatigue which is chronic. He had a prostate biopsy which was negative.  He follows with Dr Ewing SchleinPervaiz.       REVIEW OF SYSTEMS:  Pertinent review of systems as discussed in HPI   PAST MEDICAL HISTORY:  Past Medical History:   Diagnosis Date   . Anxiety state    . BPH (benign prostatic hyperplasia)    . Depression, acute    . Diabetes mellitus, type 2 (CMS HCC)    . Elevated PSA    . Generalized OA    . Hemochromatosis    . Hyperlipidemia    . Hypertension    . Hypomagnesemia    . Insomnia    . Testicular  hypofunction    . Vitamin D deficiency          MEDICATIONS:  Current Outpatient Medications   Medication Sig   . amLODIPine (NORVASC) 10 mg Oral Tablet    . aspirin (ECOTRIN) 81 mg Oral Tablet, Delayed Release (E.C.) Take 1 Tablet (81 mg total) by mouth Once a day   . cyanocobalamin (VITAMIN B12) 2,500 mcg Sublingual Tablet, Sublingual Place 1 Tablet (2,500 mcg total) under the tongue Once a day   . DULoxetine (CYMBALTA DR) 30 mg Oral Capsule, Delayed Release(E.C.) Take 1 Capsule (30 mg total) by mouth Once a day for 120 days Indications: chronic muscle or bone pain   . ergocalciferol, vitamin D2, (DRISDOL) 1,250 mcg (50,000 unit) Oral Capsule    . finasteride (PROSCAR) 5 mg Oral Tablet    . glimepiride (AMARYL) 2 mg Oral Tablet    . meloxicam (MOBIC) 15 mg Oral Tablet    . metFORMIN (GLUCOPHAGE) 500 mg Oral Tablet Take 2 Tablets (1,000 mg total) by mouth Twice daily with food   . metoprolol succinate (TOPROL-XL) 50 mg Oral Tablet Sustained Release 24 hr    . olmesartan (  BENICAR) 40 mg Oral Tablet    . rosuvastatin (CRESTOR) 10 mg Oral Tablet Take 1 Tablet (10 mg total) by mouth Every evening   . tamsulosin (FLOMAX) 0.4 mg Oral Capsule      ALLERGIES:  No Known Allergies  PAST SURGICAL HISTORY:  Past Surgical History:   Procedure Laterality Date   . CYSTOSCOPY  06/23/2017   . HX APPENDECTOMY           SOCIAL HISTORY:  Social History     Tobacco Use   Smoking Status Never   Smokeless Tobacco Never      E-CigaretteVaping History:  E-Cigarette/Vaping Use: Never User      Substances vaped:  Nicotine: No  CBD: No      Additional information:  Disposable: No  Refillable Tank: No     FAMILY HISTORY:  Family Medical History:     Problem Relation (Age of Onset)    Liver Cancer Father    Stroke Mother            PHYSICAL EXAMINATION:  Vitals:  Vitals:    10/23/21 0949   BP: (!) 187/85   Pulse: 83   Temp: 36.8 C (98.3 F)   TempSrc: Thermal Scan   Weight: 93.8 kg (206 lb 12.8 oz)   Height: 1.727 m (5\' 8" )   BMI: 31.51          Body mass index is 31.44 kg/m.    ECOG PS 0    Physical Exam  Constitutional:       Appearance: Normal appearance.   HENT:      Head: Normocephalic and atraumatic.      Nose: Nose normal.      Mouth/Throat:      Mouth: Mucous membranes are moist.      Pharynx: Oropharynx is clear.   Eyes:      Pupils: Pupils are equal, round, and reactive to light.   Cardiovascular:      Rate and Rhythm: Normal rate and regular rhythm.      Pulses: Normal pulses.      Heart sounds: Normal heart sounds.   Pulmonary:      Effort: Pulmonary effort is normal.      Breath sounds: Normal breath sounds.   Abdominal:      General: Abdomen is flat. Bowel sounds are normal.      Palpations: Abdomen is soft.   Musculoskeletal:         General: Normal range of motion.      Cervical back: Normal range of motion and neck supple.   Neurological:      General: No focal deficit present.      Mental Status: He is alert and oriented to person, place, and time.   Psychiatric:         Mood and Affect: Mood normal.            LABORATORY:    Recent labs from 10/14/21       Latest Reference Range & Units 10/14/21 16:02   WBCS UNCORRECTED x10^3/uL 9.1   WBC 4.0 - 10.5 x10^3/uL 9.1   HGB 13.5 - 18.0 g/dL 10/16/21   HCT 11.0 - 21.1 % 45.7   PLATELET COUNT 140 - 440 x10^3/uL 183   RBC 4.20 - 6.00 x10^6/uL 5.17   MCV 78.0 - 99.0 fL 88.4   MCHC 32.0 - 36.0 g/dL 17.3   MCH 56.7 - 01.4 pg 30.7   RDW 11.6 - 14.8 % 13.9  MPV 7.4 - 10.4 fL 9.4   SODIUM 136 - 145 mmol/L 140   POTASSIUM 3.5 - 5.1 mmol/L 3.9   CHLORIDE 98 - 107 mmol/L 105   CARBON DIOXIDE 21 - 31 mmol/L 26   BUN 7 - 25 mg/dL 19   CREATININE 0.03 - 1.30 mg/dL 4.91   GLUCOSE 74 - 791 mg/dL 86   ANION GAP 10 - 20 mmol/L 9 (L)   BUN/CREAT RATIO 6 - 22  17   ESTIMATED GLOMERULAR FILTRATION RATE >59 mL/min/1.85m^2 70   CALCIUM 8.6 - 10.3 mg/dL 50.5 (H)   CHOLESTEROL <200 mg/dL 697   HDL-CHOLESTEROL 23 - 92 mg/dL 44   LDL (CALCULATED) 0 - 100 mg/dL 98   TRIGLYCERIDES <=948 mg/dL 016 (H)   VLDL  (CALCULATED) 0 - 50 mg/dL 39   CHOL/HDL RATIO  4.1   TSH 0.450 - 5.330 uIU/mL 2.858   TOTAL PROTEIN 6.4 - 8.9 g/dL 7.9   ALBUMIN 3.5 - 5.7 g/dL 4.6   (L): Data is abnormally low  (H): Data is abnormally high   Latest Reference Range & Units 10/14/21 16:02   PROSTATE SPECIFIC AG <=4.00 ng/mL 1.76   VITAMIN B12 180 - 914 pg/mL 720   HEMOGLOBIN A1C 4.0 - 6.0 % 6.8 (H)   (H): Data is abnormally high  IMAGING:   Ultrasound of liver shows normal results 6/22.  The results were discussed with the patient a copy of the ultrasound report was given to the patient.       ASSESSMENT:   No diagnosis found.   PLAN:    1.  Hereditary hemochromatosis with combined heterozygosity C282Y/ H 63 D  With no end-organ damage to heart, liver or pancreas.  There is no recent iron study on his chart to determine if he needs phlebotomy.  Will order iron studies.   Follow-up in 6 months.     2. Elevated PSA level.  Follow-up with urology.

## 2021-10-28 IMAGING — US CAROTID US W/DOPPLER
1 series · 14 of 24 positions shown · non-contrast
Comparison: None available.

﻿EXAM:  CAROTID US W/DOPPLER
INDICATION: Right upper extremity numbness and tingling.

[Series 1: carotid us w/doppler · 14 of 72 slices shown]
[im 1/72]
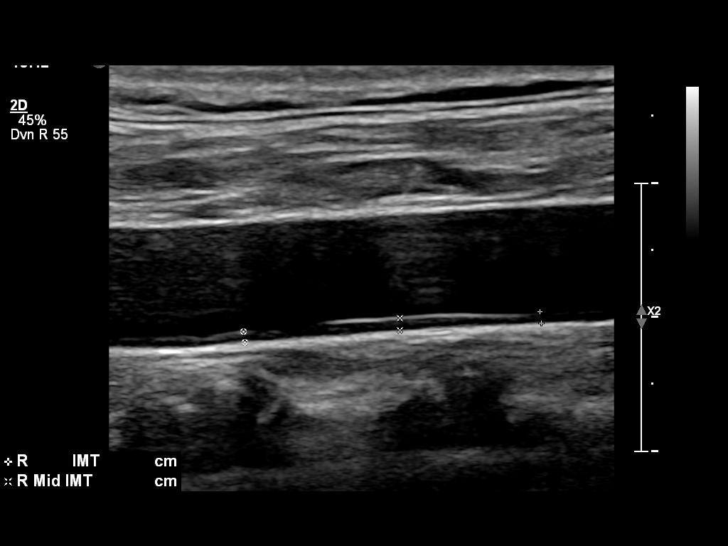
[im 7/72]
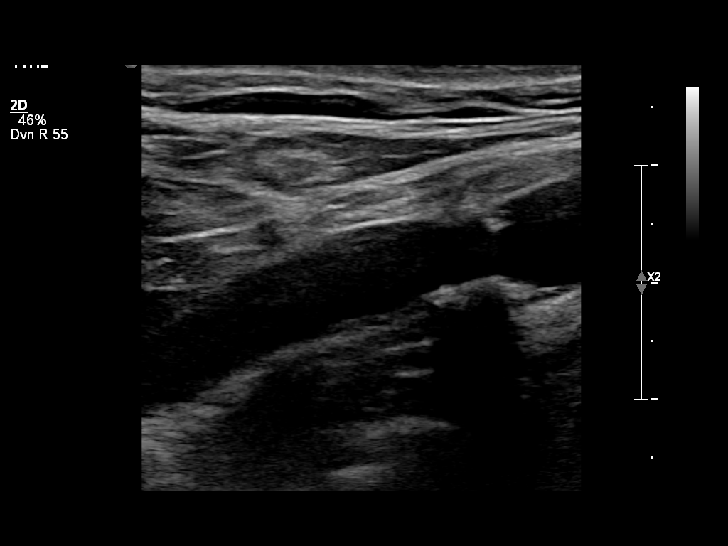
[im 13/72]
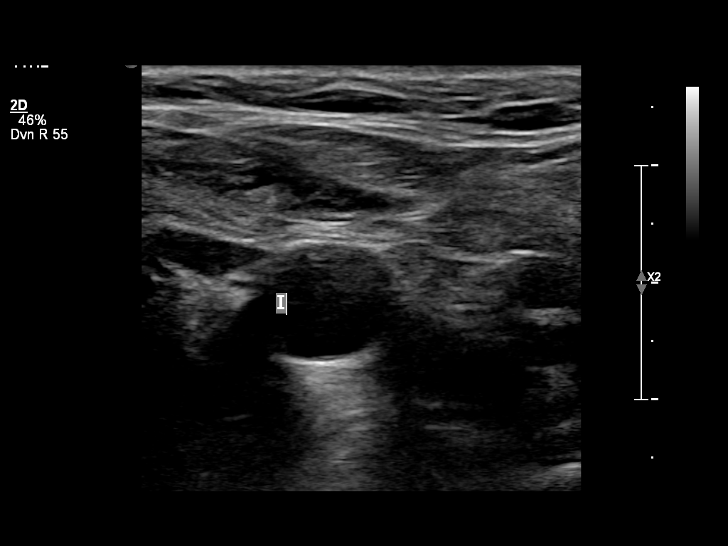
[im 19/72]
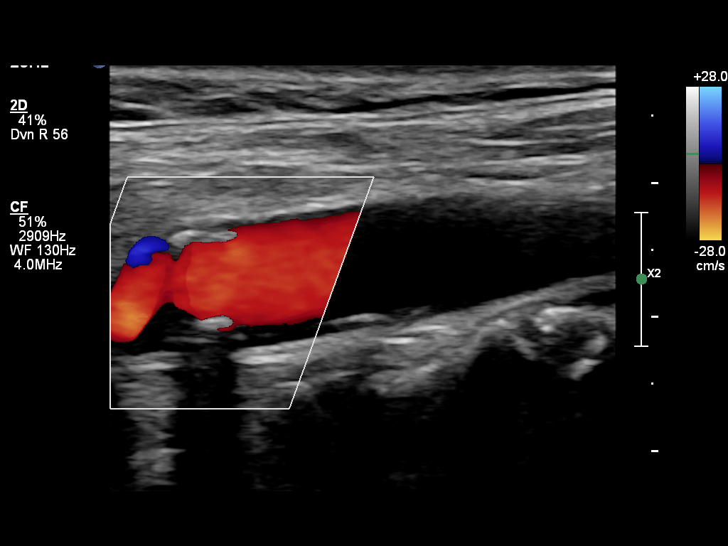
[im 22/72]
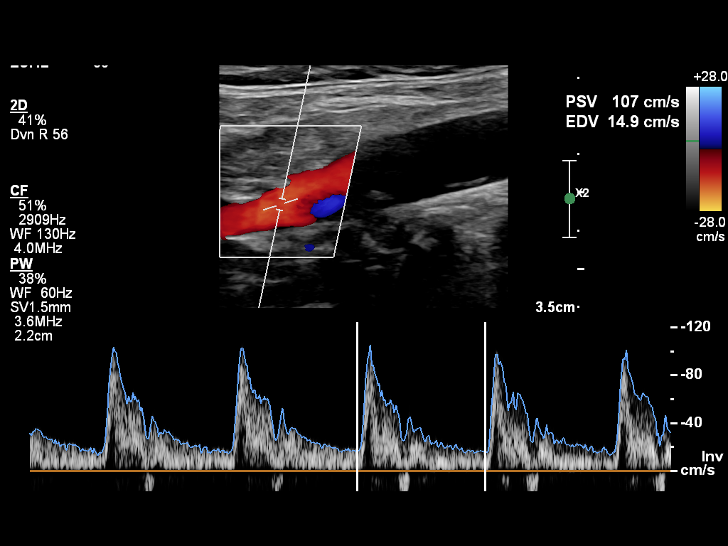
[im 28/72]
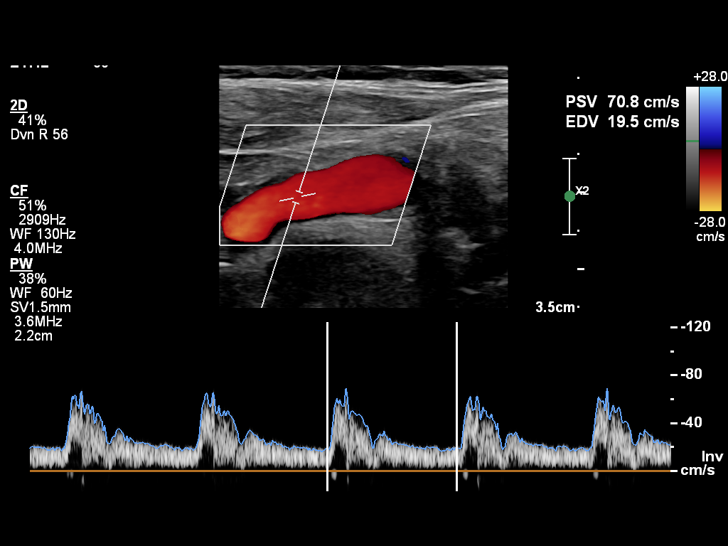
[im 34/72]
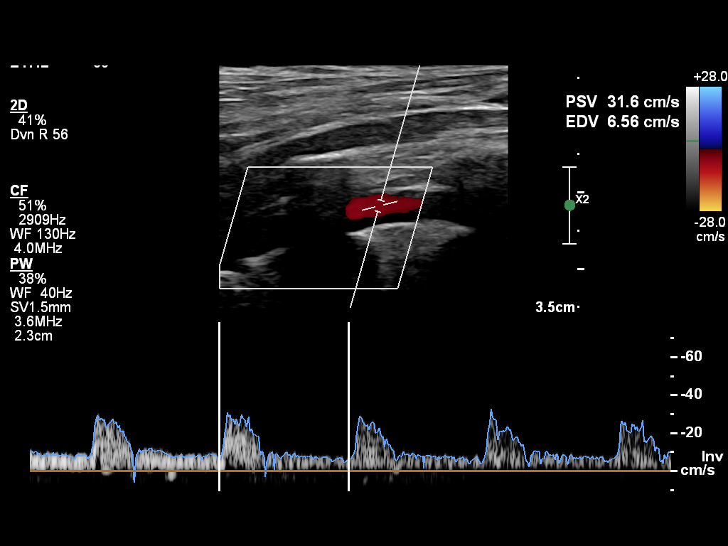
[im 38/72]
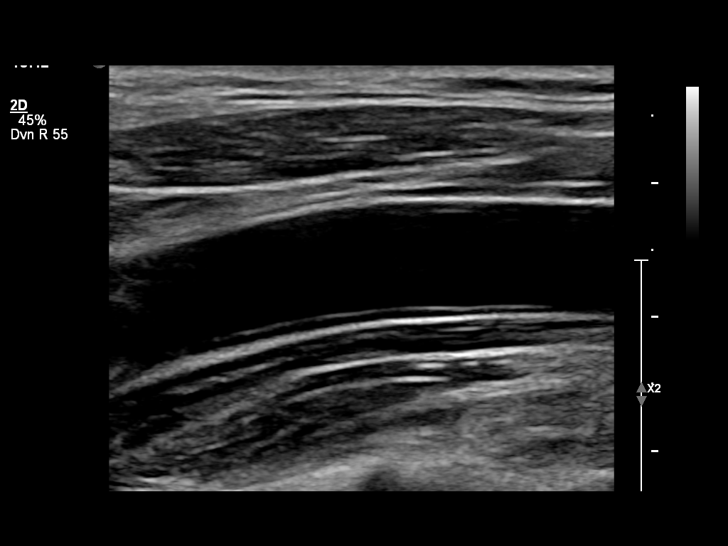
[im 44/72]
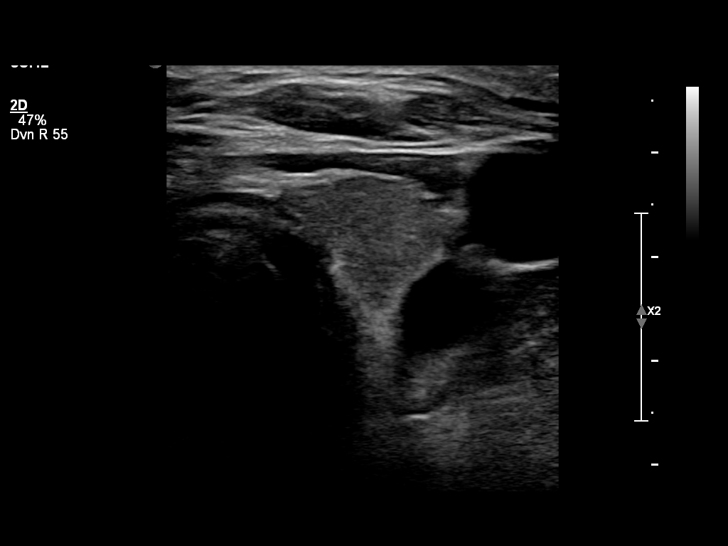
[im 50/72]
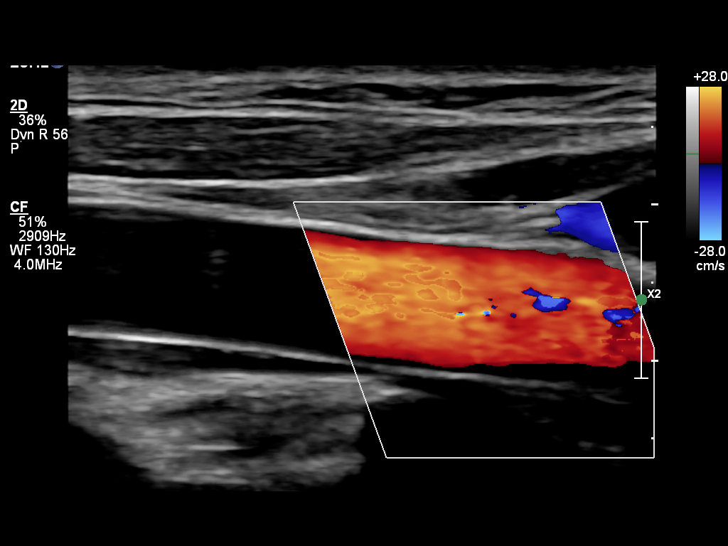
[im 56/72]
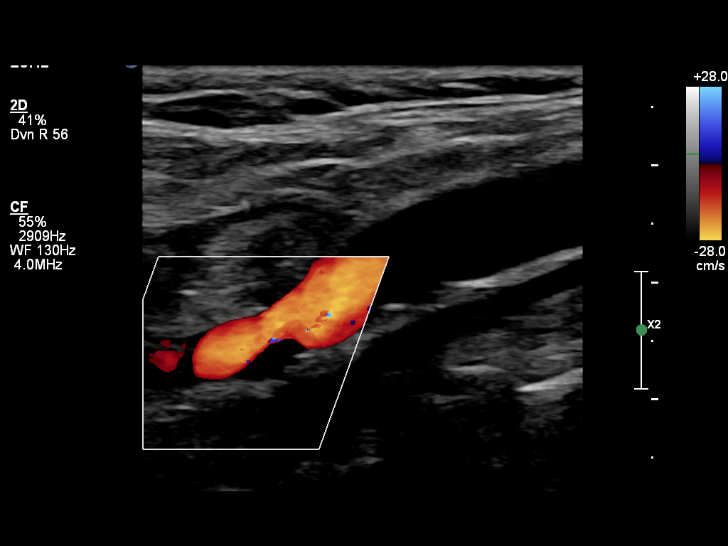
[im 59/72]
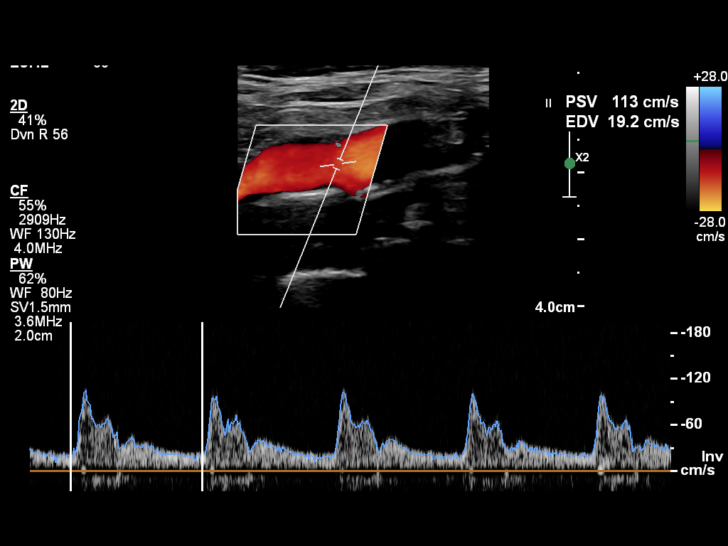
[im 65/72]
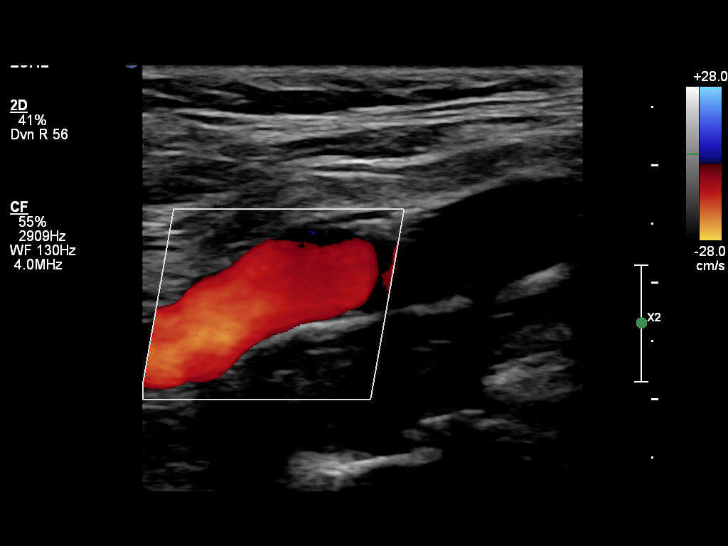
[im 72/72]
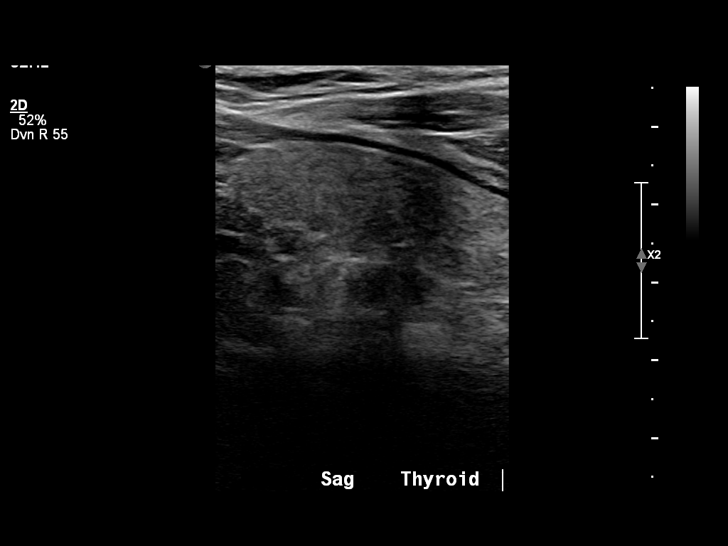

[14 of 24 positions shown; findings below may reference images not displayed]

FINDINGS: Right CCA

91

Left CCA

108

PSV

IC/CC

EDV

Right Internal

90

0.99

16

Left Internal

114

1.06

20

Right Vertebral

Antegrade

Left Vertebral

Antegrade

There is a small to moderate amount of heterogeneous atherosclerotic plaque within the carotid bulbs bilaterally. No hemodynamically significant stenosis or abnormal elevation of velocity is seen at any level. Both vertebral arteries are patent with appropriate antegrade direction of flow.
IMPRESSION: No hemodynamically significant stenosis within the internal carotid arteries as per NASCET criteria.

## 2021-10-30 ENCOUNTER — Encounter (INDEPENDENT_AMBULATORY_CARE_PROVIDER_SITE_OTHER): Payer: Self-pay | Admitting: Internal Medicine

## 2021-11-05 ENCOUNTER — Telehealth (INDEPENDENT_AMBULATORY_CARE_PROVIDER_SITE_OTHER): Payer: Self-pay | Admitting: Internal Medicine

## 2021-11-05 NOTE — Telephone Encounter (Signed)
Per vm:  Patient is requesting latest test results.  [phone] or [phone]

## 2021-11-07 ENCOUNTER — Other Ambulatory Visit: Payer: Self-pay

## 2021-11-07 ENCOUNTER — Ambulatory Visit (HOSPITAL_COMMUNITY)
Admission: RE | Admit: 2021-11-07 | Discharge: 2021-11-07 | Disposition: A | Discharge status: Still In Hospital | Payer: Medicare Other | Source: Ambulatory Visit

## 2021-11-07 DIAGNOSIS — G589 Mononeuropathy, unspecified: Secondary | ICD-10-CM

## 2021-11-07 DIAGNOSIS — M40294 Other kyphosis, thoracic region: Secondary | ICD-10-CM | POA: Insufficient documentation

## 2021-11-07 DIAGNOSIS — M546 Pain in thoracic spine: Secondary | ICD-10-CM | POA: Insufficient documentation

## 2021-11-07 NOTE — PT Evaluation (Signed)
[facility]  Outpatient Physical Therapy  [place], [address]  [phone]  [phone]       Physical Therapy Upper Extremity Evaluation    Date: 11/07/2021  Patient's Name: Steven Henderson  Date of Birth: 11/16/1950    Referring diagnosis: thoracic nerve entrapment           SUBJECTIVE  Chief Complaint/MOI: Patient reports 6 months ago, he experienced gradual onset of middle back pain and right arm numbness without MOI.  Patient states he had x-rays done in the neck and the middle back in which they said he had bulging discs and degenerative changes.  Patient relates the RUE has reoccurring episodes of numbness that affects the entire hand with numbness.  Denies ROM deficits in the neck and shoulder.  Patient cannot state any reason for episodes of numbness into the RUE, happens randomly, and they last about 30 minutes.  Patient reports he has tingling and hurting in the middle part of the back that worsens when he's on his feet doing activities.    Past Medical History/Precautions:    Past Medical History:   Diagnosis Date   . Anxiety state    . BPH (benign prostatic hyperplasia)    . Depression, acute    . Diabetes mellitus, type 2 (CMS HCC)    . Elevated PSA    . Generalized OA    . Hemochromatosis    . Hyperlipidemia    . Hypertension    . Hypomagnesemia    . Insomnia    . Testicular hypofunction    . Vitamin D deficiency          Past Surgical History:   Procedure Laterality Date   . CYSTOSCOPY  06/23/2017   . HX APPENDECTOMY             Previous episodes/treatments: None    Medications for this problem: none    Diagnostic tests: states cervical MRI stated bulging discs; ruled out stroke via brain MRI    Patient goals: REDUCE PAIN and NORMALIZE FUNCTION    Occupation: [employer] home     Next MD visit: 11/17/21    Pain location: middle back, RUE radicular symptoms                    Pain description: SHARP, DULL, ACHING, STABBING, BURNING, TINGLING and  PRESSURE    Pain frequency:  CONTINUOUS and constant in the middle back    Pain rating: Now 6   Best 4   Worst 8    Pain increases with: POSITION CHANGE, ADLs, ACTIVITY, STAND, WALK, BENDING, LIFTING 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 Lifting for work 5   2. House ADLs like vacuuming  5   3. Driving  3   Total 4     OBJECTIVE    Posture: INCREASED THORACIC KYPHOSIS    Cervical screening: 30 flexion, 15 extension, 9 LSB, 13 RSB, 30 LROT, 30 RROT (PROM WFL)    Shoulder AROM  Right (degrees) Left (degrees)   Flexion 100 105   Abduction 95 95   ER 25 31   IR L5 L5       ROM comment PROM WFL    Strength (defined as __/5 based on 0/5 - 5/5 grading system)  RUE: 4-/5  LUE: 4+/5    Strength comment   JAMAR: 58# on the R, 65# on the left    Palpation: T4-T10 and corresponding ribs on the right  Special Tests:   -Open book - significant restrictions bilaterally, approximately 20% of normal with left rotation and 10% of normal with right rotation.    Reflexes  Biceps 1   Triceps 1   Brachioradialis 1       Treatment provided:REVIEW OF POC AND GOALS WITH PATIENT, ALL QUESTIONS ANSWERED, PATIENT EDUCATION and THERAPEUTIC EXERCISE      Access Code: [email or web address]  URL: [email or web address]  Date: 11/07/2021  Prepared by: Trinna Post Matilyn Fehrman    Exercises  - Sidelying Open Book Thoracic Lumbar Rotation and Extension  - 1 x daily - 7 x weekly - 3 sets - 15 reps  - Seated Thoracic Flexion and Rotation with Arms Crossed  - 1 x daily - 7 x weekly - 3 sets - 15 reps  - Seated Thoracic Lumbar Extension  - 1 x daily - 7 x weekly - 3 sets - 15 reps  - Scapular Retraction with Resistance  - 1 x daily - 7 x weekly - 3 sets  - 15 reps          ASSESSMENT    71 y.o. M presents to outpatient PT referred for thoracic nerve entrapment.  Patient demonstrates deficits in thoracic mobility, postural deficits, thoracic hypomobility syndrome, and cervical and shoulder ROM deficits bilaterally most likely due to poor thoracic mobility.  He will benefit from outpatient PT to address the above deficits to reach the highest functional level possible.    Patient tolerated evaluation and treatment well.  He demonstrated and verbalized understanding of HEP and patient education.     Rehab potential: GOOD    STGs 3 Weeks:  1.  Patient will be independent (with PRN use of HEP handout) with progressive HEP to maximize gains from PT.  2.  Patient will report less than max pain of 5/10 to aid with participation in physical therapy.    LTGs 6 Weeks:  1.  Patient will improve functional ability with Patient Specific Functional Scale score of at least 6.  2.  Patient will demonstrate improved B shoulder AROM to at least 20 degrees to aid in ability to perform ADLs independently.  3.  Patient will demonstrate improved R shoulder strength to at least 4+/5 throughout to aid in return to previous level of function.  4.  Patient will demonstrate improved grip strength with R  UE to at least 65 pounds of pressure to aid in completion of IADLs.          PLAN  Patient will attend 2 times per week x 6 weeks. Therapy may include, but is not limited to THERAPEUTIC EXERCISES, MYOFASCIAL/JOINT MOBILIZATION, POSTURE/BODY MECHANICS, ERGONOMIC TRAINING, TRANSFER/GAIT TRAINING, HOME INSTRUCTIONS, HEAT/COLD, ULTRASOUND, ELECTRICAL STIMULATION, KINESIOTAPE and NEURO RE-EDUCATIOIN    At next visit: Initiate UBE warmup retro followed by thoracic mobility and postural strengthening to facilitate thoracic rotation and extension.  Joint mobilizations in prone position to improve thoracic mobility     Evaluation complexity:   Personal factors impacting POC: FREQUENT OR CHRONIC PAIN  and OCCUPATIONAL  ADLS (IE HEAVY LIFTING, REPETITIVE TASKS, LONG HOURS)   Co-morbidities impacting POC: Diabetes, HTN  Complexity of physical exam: INCLUDING MUSCULOSKELETAL SYSTEM (POSTURE, ROM, STRENGTH, HEIGHT/WEIGHT), INCLUDING NEUROMUSCULAR EXAM (BALANCE, GAIT, LOCOMOTION, MOBILITY) and INCLUDING ACTIVITY/MOBILITY RESTRICTIONS   Clinical Presentation: STABLE   Evaluation Complexity: LOW-HISTORY 0, EXAMINATION 1-2, STABLE PRESENTATION     Intervention minutes: EVALUATION 36 minutes and THERAPEUTIC EXERCISE 8 minutes    Maansi Wike, PT  11/07/2021, 10:16    Start of Service: _________          Certification:    From:______  Through:_________    I certify the need for these services furnished under this plan of treatment and while under my care.    Referring Provider Signature: _______________     Date : _____________________

## 2021-11-12 ENCOUNTER — Ambulatory Visit (HOSPITAL_COMMUNITY)
Admission: RE | Admit: 2021-11-12 | Discharge: 2021-11-12 | Disposition: A | Discharge status: Still In Hospital | Payer: Medicare Other | Source: Ambulatory Visit

## 2021-11-12 ENCOUNTER — Other Ambulatory Visit: Payer: Self-pay

## 2021-11-13 ENCOUNTER — Encounter (INDEPENDENT_AMBULATORY_CARE_PROVIDER_SITE_OTHER): Payer: Self-pay | Admitting: Internal Medicine

## 2021-11-14 ENCOUNTER — Other Ambulatory Visit: Payer: Self-pay

## 2021-11-14 ENCOUNTER — Ambulatory Visit (HOSPITAL_COMMUNITY)
Admission: RE | Admit: 2021-11-14 | Discharge: 2021-11-14 | Disposition: A | Discharge status: Still In Hospital | Payer: Medicare Other | Source: Ambulatory Visit

## 2021-11-14 NOTE — PT Treatment (Signed)
[facility]  Outpatient Physical Therapy  [address]  [place], [address]  [phone]  (Fax) [phone]    Physical Therapy Treatment Note    Date: 11/14/2021  Patient's Name: Steven Henderson  Date of Birth: April 09, 1951            Visit #3/POC:8/11  Authorization:med nec  POC Signed?: yes  POC Ends: 8/11  Next Progress Note Due: 30 days      Evaluating Physical Therapist: Marcell Anger, PT, DPT  PT diagnosis/Reason for Referral: thoracic nerve entrapment   Next Scheduled Physician Appointment: TBD  Allergies/Contraindications: none          Subjective: Patient reports 5-6/10 thoracic pain.  Relates it isn't as bad as it is usually    Objective: therex for thoracic mobility and postural strengthening as followed:    EXERCISE/ACTIVITY NAME REPETITIONS RESISTANCE COMPLETED THIS DOS   UBE   5 minutes - Y   SA foam roll up wall  [employer] - Y   T-band row  T-band low row  T-band horizontal abduction sitting   x15  X15  x15 Blue  Green  Green Y   Seated thoracic rotation  Seated thoracic extension   x15  x15 PB Y   Prone bilateral shoulder extension  Prone bilateral horizontal abduction  Prone bilateral shoulder "Y" flexion  Prone bilateral ER behind head flares   x10 with 2" holds AROM Y   Thoracic PA mobiliations   8 minutes Grade 2-3 Y                               Assessment: Tolerated session well.  Thoracic hypmobility throughout, as well as decreased rib spring on the right middle t-spine.  Tolerated session well, but fatigues quickly.  Exhibits greater thoracic rotation hypomobility toward the right compared to the left.    Plan: Continue POC    Total Session Time 41 and Timed code minutes 41  THERAPEUTIC EXERCISE 30 minutes and JOINT MOBILIZATION/MFR 11 minutes      Steven Henderson, PT  11/14/2021, 14:47

## 2021-11-17 ENCOUNTER — Ambulatory Visit (INDEPENDENT_AMBULATORY_CARE_PROVIDER_SITE_OTHER): Payer: Medicare Other | Admitting: Internal Medicine

## 2021-11-17 ENCOUNTER — Ambulatory Visit (HOSPITAL_COMMUNITY)
Admission: RE | Admit: 2021-11-17 | Discharge: 2021-11-17 | Disposition: A | Discharge status: Still In Hospital | Payer: Medicare Other | Source: Ambulatory Visit

## 2021-11-17 ENCOUNTER — Encounter (INDEPENDENT_AMBULATORY_CARE_PROVIDER_SITE_OTHER): Payer: Self-pay | Admitting: Internal Medicine

## 2021-11-17 ENCOUNTER — Other Ambulatory Visit: Payer: Self-pay

## 2021-11-17 VITALS — BP 162/93 | HR 86 | Ht 68.0 in | Wt 205.6 lb

## 2021-11-17 DIAGNOSIS — M546 Pain in thoracic spine: Secondary | ICD-10-CM

## 2021-11-17 DIAGNOSIS — M159 Polyosteoarthritis, unspecified: Secondary | ICD-10-CM

## 2021-11-17 DIAGNOSIS — N401 Enlarged prostate with lower urinary tract symptoms: Secondary | ICD-10-CM

## 2021-11-17 DIAGNOSIS — E782 Mixed hyperlipidemia: Secondary | ICD-10-CM

## 2021-11-17 DIAGNOSIS — R338 Other retention of urine: Secondary | ICD-10-CM

## 2021-11-17 DIAGNOSIS — F32A Depression, unspecified: Secondary | ICD-10-CM

## 2021-11-17 DIAGNOSIS — I10 Essential (primary) hypertension: Secondary | ICD-10-CM

## 2021-11-17 DIAGNOSIS — F5101 Primary insomnia: Secondary | ICD-10-CM

## 2021-11-17 DIAGNOSIS — M25811 Other specified joint disorders, right shoulder: Secondary | ICD-10-CM

## 2021-11-17 DIAGNOSIS — E119 Type 2 diabetes mellitus without complications: Secondary | ICD-10-CM

## 2021-11-17 DIAGNOSIS — G8929 Other chronic pain: Secondary | ICD-10-CM

## 2021-11-17 MED ORDER — HYDROCHLOROTHIAZIDE 25 MG TABLET
25.0000 mg | ORAL_TABLET | Freq: Every day | ORAL | 4 refills | Status: DC
Start: 2021-11-17 — End: 2022-09-07

## 2021-11-17 MED ORDER — METHOCARBAMOL 500 MG TABLET
500.0000 mg | ORAL_TABLET | Freq: Four times a day (QID) | ORAL | 1 refills | Status: DC
Start: 2021-11-17 — End: 2021-12-24

## 2021-11-17 NOTE — Progress Notes (Signed)
[facility]NAL [facility], [facility]  [address]  [place] [id number]       Name: Steven Henderson MRN:  [id number]   Date: 11/17/2021 Age: 71 y.o.       Chief Complaint:    Chief Complaint   Patient presents with   . Follow Up     1 month Recheck LOV 5/30       HPI:  Steven Henderson is a 71 y.o. male who presents to the office today via follow-up of his last exam in May.  The patient has had a persistent pain that is in his rhomboid and upper thoracic region and radiating over into the right shoulder.  He has decreased range of motion in his right shoulder secondary to pain in his only able to abduct his right arm to about 60 will without having pain.  He also describes pain as being present in his mid upper thoracic region along the center of the thoracic spine.  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  He is concerned to the fact that this is not getting better and is wanting to know what else can be done.  He states the pain is worse whenever he is up and ambulatory and it is improve whenever he lays down in the evening.  Otherwise he states that his blood sugars have been doing very well and he is tolerating the Cymbalta from not only a mood standpoint but also a pain standpoint.        Past Medical History:  Past Medical History:   Diagnosis Date   . Anxiety state    . BPH (benign prostatic hyperplasia)    . Depression, acute    . Diabetes mellitus, type 2 (CMS HCC)    . Elevated PSA    . Generalized OA    . Hemochromatosis    . Hyperlipidemia    . Hypertension    . Hypomagnesemia    . Insomnia    . Testicular hypofunction    . Vitamin D deficiency          Past Surgical History:   Procedure Laterality Date   . CYSTOSCOPY  06/23/2017   . HX APPENDECTOMY        Current Outpatient Medications   Medication Sig   . amLODIPine (NORVASC) 10 mg Oral Tablet     . aspirin (ECOTRIN) 81 mg Oral Tablet, Delayed Release (E.C.) Take 1 Tablet (81 mg total) by mouth Once a day   . cyanocobalamin (VITAMIN B12) 2,500 mcg Sublingual Tablet, Sublingual Place 1 Tablet (2,500 mcg total) under the tongue Once a day   . DULoxetine (CYMBALTA DR) 30 mg Oral Capsule, Delayed Release(E.C.) Take 1 Capsule (30 mg total) by mouth Once a day for 120 days Indications: chronic muscle or bone pain   . ergocalciferol, vitamin D2, (DRISDOL) 1,250 mcg (50,000 unit) Oral Capsule    . finasteride (PROSCAR) 5 mg Oral Tablet    . glimepiride (AMARYL) 2 mg Oral Tablet    . hydroCHLOROthiazide (HYDRODIURIL) 25 mg Oral Tablet Take 1 Tablet (25 mg total) by mouth Once a day   . meloxicam (MOBIC) 15 mg Oral Tablet    . metFORMIN (GLUCOPHAGE) 500 mg Oral Tablet Take 2 Tablets (1,000 mg total) by mouth Twice daily with food   . methocarbamoL (ROBAXIN) 500 mg  Oral Tablet Take 1 Tablet (500 mg total) by mouth Four times a day   . metoprolol succinate (TOPROL-XL) 50 mg Oral Tablet Sustained Release 24 hr    . olmesartan (BENICAR) 40 mg Oral Tablet    . rosuvastatin (CRESTOR) 10 mg Oral Tablet Take 1 Tablet (10 mg total) by mouth Every evening   . tamsulosin (FLOMAX) 0.4 mg Oral Capsule      No Known Allergies    Family History:  Family Medical History:     Problem Relation (Age of Onset)    Liver Cancer Father    Stroke Mother            Social History:   Social History     Tobacco Use   Smoking Status Never   Smokeless Tobacco Never     Social History     Substance and Sexual Activity   Alcohol Use Never     Social History     Occupational History   . Not on file       Review of Systems:  Review of systems as discussed in HPI      Problem List:  Patient Active Problem List   Diagnosis   . Anxiety state   . BPH (benign prostatic hyperplasia)   . Depression, acute   . Diabetes mellitus, type 2 (CMS HCC)   . Elevated PSA   . Generalized OA   . Hemochromatosis   . Hyperlipidemia   . Hypertension   . Hypomagnesemia    . Insomnia   . Major depression, recurrent (CMS HCC)   . Chronic bilateral thoracic back pain       Physical Examination:  BP (!) 162/93 (Site: Right, Patient Position: Sitting, Cuff Size: Adult)   Pulse 86   Ht 1.727 m (_0 )   Wt 93.3 kg (205 lb 9.6 oz)   SpO2 97%   BMI 31.26 kg/m       Physical Exam  Vitals and nursing note reviewed.   Constitutional:       General: He is not in acute distress.     Appearance: Normal appearance.   HENT:      Head: Normocephalic.      Right Ear: Tympanic membrane normal.      Left Ear: Tympanic membrane normal.      Nose: Nose normal.      Mouth/Throat:      Mouth: Mucous membranes are moist.   Eyes:      General: No scleral icterus.     Extraocular Movements: Extraocular movements intact.      Conjunctiva/sclera: Conjunctivae normal.      Pupils: Pupils are equal, round, and reactive to light.   Neck:      Vascular: No carotid bruit.   Cardiovascular:      Rate and Rhythm: Normal rate and regular rhythm.      Pulses: Normal pulses.           Dorsalis pedis pulses are 2+ on the right side and 2+ on the left side.        Posterior tibial pulses are 2+ on the right side and 2+ on the left side.      Heart sounds: Normal heart sounds.   Pulmonary:      Effort: Pulmonary effort is normal.      Breath sounds: Normal breath sounds. No wheezing, rhonchi or rales.   Abdominal:      General: Abdomen is flat. Bowel sounds are normal.  Tenderness: There is no abdominal tenderness. There is no guarding.   Musculoskeletal:         General: No swelling. Normal range of motion.      Cervical back: Normal range of motion. No tenderness.      Right lower leg: No edema.      Left lower leg: No edema.   Skin:     General: Skin is warm.      Capillary Refill: Capillary refill takes less than 2 seconds.      Findings: Ecchymosis present.   Neurological:      General: No focal deficit present.      Mental Status: He is alert and oriented to person, place, and time.      Cranial Nerves: No  cranial nerve deficit.         Data Reviewed:  Documentation Only on 10/30/2021   Component Date Value Ref Range Status   . HEMOGLOBIN A1C 09/17/2020 6.4   Final   . CHOLESTEROL 09/17/2020 65   Final   . HDL-CHOLESTEROL 09/17/2020 39   Final   . LDL (CALCULATED) 09/17/2020 102   Final   . TRIGLYCERIDES 09/17/2020 122   Final   . LDL CHOLESTEROL,DIRECT 09/17/2020 24   Final   Appointment on 10/23/2021   Component Date Value Ref Range Status   . FERRITIN 10/23/2021 18  11 - 336 ng/mL Final   . TOTAL IRON BINDING CAPACITY 10/23/2021 384  250 - 450 ug/dL Final   . IRON (TRANSFERRIN) SATURATION 10/23/2021 39  20 - 50 % Final   . IRON 10/23/2021 150  50 - 212 ug/dL Final   . TRANSFERRIN 10/23/2021 274  203 - 362 mg/dL Final   . UIBC 10/23/2021 234  130 - 375 ug/dL Final   Appointment on 10/14/2021   Component Date Value Ref Range Status   . WBCS UNCORRECTED 10/14/2021 9.1  x10^3/uL Final   . WBC 10/14/2021 9.1  4.0 - 10.5 x10^3/uL Final   . RBC 10/14/2021 5.17  4.20 - 6.00 x10^6/uL Final   . HGB 10/14/2021 15.9  13.5 - 18.0 g/dL Final   . HCT 10/14/2021 45.7  42.0 - 51.0 % Final   . MCV 10/14/2021 88.4  78.0 - 99.0 fL Final   . MCH 10/14/2021 30.7  27.0 - 32.0 pg Final   . MCHC 10/14/2021 34.7  32.0 - 36.0 g/dL Final   . RDW 10/14/2021 13.9  11.6 - 14.8 % Final   . PLATELETS 10/14/2021 183  140 - 440 x10^3/uL Final   . MPV 10/14/2021 9.4  7.4 - 10.4 fL Final   . SODIUM 10/14/2021 140  136 - 145 mmol/L Final   . POTASSIUM 10/14/2021 3.9  3.5 - 5.1 mmol/L Final   . CHLORIDE 10/14/2021 105  98 - 107 mmol/L Final   . CO2 TOTAL 10/14/2021 26  21 - 31 mmol/L Final   . ANION GAP 10/14/2021 9 (L)  10 - 20 mmol/L Final   . BUN 10/14/2021 19  7 - 25 mg/dL Final   . CREATININE 10/14/2021 1.13  0.60 - 1.30 mg/dL Final   . BUN/CREA RATIO 10/14/2021 17  6 - 22 Final   . ESTIMATED GFR 10/14/2021 70  >59 mL/min/1.72m2 Final   . ALBUMIN 10/14/2021 4.6  3.5 - 5.7 g/dL Final   . CALCIUM 10/14/2021 10.4 (H)  8.6 - 10.3 mg/dL Final   .  GLUCOSE 10/14/2021 86  74 - 109 mg/dL Final   .  ALKALINE PHOSPHATASE 10/14/2021 59  34 - 104 U/L Final   . ALT (SGPT) 10/14/2021 33  7 - 52 U/L Final   . AST (SGOT) 10/14/2021 26  13 - 39 U/L Final   . BILIRUBIN TOTAL 10/14/2021 0.8  0.3 - 1.2 mg/dL Final   . PROTEIN TOTAL 10/14/2021 7.9  6.4 - 8.9 g/dL Final   . ALBUMIN/GLOBULIN RATIO 10/14/2021 1.4  0.8 - 1.4 Final   . OSMOLALITY, CALCULATED 10/14/2021 281  270 - 290 mOsm/kg Final   . CALCIUM, CORRECTED 10/14/2021 9.8  8.9 - 10.8 mg/dL Final   . GLOBULIN 10/14/2021 3.3  2.9 - 5.4 Final   . VITAMIN B 12 10/14/2021 720  180 - 914 pg/mL Final   . TSH 10/14/2021 2.858  0.450 - 5.330 uIU/mL Final   . PSA 10/14/2021 1.76  <=4.00 ng/mL Final   . HEMOGLOBIN A1C 10/14/2021 6.8 (H)  4.0 - 6.0 % Final   . CHOLESTEROL 10/14/2021 181  <200 mg/dL Final   . TRIGLYCERIDES 10/14/2021 194 (H)  <=150 mg/dL Final   . HDL CHOL 10/14/2021 44  23 - 92 mg/dL Final   . LDL CALC 10/14/2021 98  0 - 100 mg/dL Final   . VLDL CALC 10/14/2021 39  0 - 50 mg/dL Final   . CHOL/HDL RATIO 10/14/2021 4.1   Final   . COLOR 10/14/2021 Yellow  Colorless, Light Yellow, Yellow Final   . APPEARANCE 10/14/2021 Clear  Clear Final   . SPECIFIC GRAVITY 10/14/2021 1.024  1.002 - 1.030 Final   . PH 10/14/2021 5.5  5.0 - 9.0 Final   . LEUKOCYTES 10/14/2021 Negative  Negative, 100  WBCs/uL Final   . NITRITE 10/14/2021 Negative  Negative Final   . PROTEIN 10/14/2021 20  Negative, 10 , 20  mg/dL Final   . GLUCOSE 10/14/2021 Negative  Negative, 30  mg/dL Final   . KETONES 10/14/2021 Negative  Negative, Trace mg/dL Final   . BILIRUBIN 10/14/2021 Negative  Negative, 0.5 mg/dL Final   . BLOOD 10/14/2021 Negative  Negative, 0.03 mg/dL Final   . UROBILINOGEN 10/14/2021 Normal  Normal mg/dL Final           Health Maintenance:  Health Maintenance   Topic Date Due   . Diabetic Retinal Exam  Never done   . Hepatitis C screening  Never done   . Pneumococcal Vaccination, Age 40+ (1 - PCV) Never done   . Diabetic Kidney Health  Microalb/Cr Ratio  Never done   . Adult Tdap-Td (1 - Tdap) Never done   . Colonoscopy  Never done   . Shingles Vaccine (1 of 2) Never done   . Covid-19 Vaccine (4 - Moderna series) 06/05/2020   . Annual Wellness Visit  Never done   . Influenza Vaccine (1) 01/16/2022   . Diabetic A1C  04/16/2022   . Diabetic Kidney Health eGFR  10/15/2022   . Prostate Cancer Screening  10/15/2023   . Meningococcal Vaccine  Aged Out        Assessment/Plan:      ICD-10-CM    1. Chronic right-sided thoracic back pain  M54.6 MRI SPINE THORACIC WO CONTRAST    G89.29       2. Diabetes mellitus, type 2 (CMS HCC)  E11.9       3. Benign prostatic hyperplasia with urinary retention  N40.1 MAGNESIUM    R33.8       4. Depression, acute  F32.A       5. Generalized  OA  M15.9       6. Primary hypertension  I10 CBC     COMPREHENSIVE METABOLIC PANEL, NON-FASTING     URINALYSIS WITH REFLEX MICROSCOPIC AND CULTURE IF POSITIVE      7. Mixed hyperlipidemia  E78.2       8. Primary insomnia  F51.01       9. 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 Impingement of right shoulder  M25.811 MRI SHOULDER RIGHT WO CONTRAST         Orders Placed This Encounter   . MRI SPINE THORACIC WO CONTRAST   . MRI SHOULDER RIGHT WO CONTRAST   . CBC   . COMPREHENSIVE METABOLIC PANEL, NON-FASTING   . URINALYSIS WITH REFLEX MICROSCOPIC AND CULTURE IF POSITIVE   . MAGNESIUM   . methocarbamoL (ROBAXIN) 500 mg Oral Tablet   . hydroCHLOROthiazide (HYDRODIURIL) 25 mg Oral Tablet              PLAN:   Patient will be ordered an MRI of the thoracic spine without contrast in regards to his persistent thoracic mid and upper pain which is not responding to physical therapy    I am also going to perform an MRI of the right shoulder without contrast and this is going to be secondary to the fact that he does appear to have a impingement via examination of his right shoulder.    The patient is going to be ordered the above-stated labs which will be addressed by me and I have  told him if there are any abnormalities I will contact him and he understands this.    His hemochromatosis have remained stable but we are going to do a recheck on this level and I have given him a refill in his hydrochlorothiazide.    I am also going to give the patient muscle relaxers consisting of Robaxin as stated above.    Follow up:  Return in about 1 month (around 12/18/2021).    This note was partially created using voice recognition software and is inherently subject to errors including those of syntax and "sound alike " substitutions which may escape proof reading.  In such instances, original meaning may be extrapolated by contextual derivation.    Nettie Elm, DO  11/17/2021, 09:14

## 2021-11-20 ENCOUNTER — Telehealth (INDEPENDENT_AMBULATORY_CARE_PROVIDER_SITE_OTHER): Payer: Self-pay | Admitting: Internal Medicine

## 2021-11-20 ENCOUNTER — Ambulatory Visit (HOSPITAL_COMMUNITY)
Admission: RE | Admit: 2021-11-20 | Discharge: 2021-11-20 | Disposition: A | Discharge status: Still In Hospital | Payer: Medicare Other | Source: Ambulatory Visit

## 2021-11-20 ENCOUNTER — Other Ambulatory Visit: Payer: Self-pay

## 2021-11-20 NOTE — PT Treatment (Signed)
[facility]  Outpatient Physical Therapy  [address]  [place], [address]  [phone]  (Fax) [phone]    Physical Therapy Treatment Note    Date: 11/20/2021  Patient's Name: Steven Henderson  Date of Birth: 06/30/1950      Visit #4/POC:8/11  Authorization:med [facility]  POC Signed?: yes  POC Ends: 8/11  Next Progress Note Due: 30 days      Evaluating Physical Therapist: Marcell Anger, PT, DPT  PT diagnosis/Reason for Referral: thoracic nerve entrapment   Next Scheduled Physician Appointment: TBD  Allergies/Contraindications: none          Subjective: Patient reports 7/10 mid thoracic back pain today    Objective: therex for thoracic mobility and postural strengthening and joint mobilizations as followed    EXERCISE/ACTIVITY NAME REPETITIONS RESISTANCE COMPLETED THIS DOS   UBE   5 minutes - Y   SA foam roll up wall  [employer] - Y   T-band row  T-band low row  T-band horizontal abduction sitting   x15  X15  x15 Blue  Blue  Blue Y   Seated thoracic rotation  Seated thoracic extension   x15  x15 PB Y   Prone bilateral shoulder extension  Prone bilateral horizontal abduction  Prone bilateral shoulder "Y" flexion  Prone bilateral ER behind head flares  Prone ER to IR compound mobility   x10 with 2" holds        x4 AROM Y   Thoracic PA mobiliations   8 minutes Grade 2-3 Y   Open book x10 each With manual overpressure Y                         Assessment: Continues with significant thoracic hypomobility.  Unable to tolerate grade 4 mobilizations due to guarding and pain.  Progressed open books to manual over pressure.  Patient requires VCs and TCs for correct form during prone scapular strengthening exercises.    Plan: Continue POC      Total Session Time 38 and Timed code minutes 38  THERAPEUTIC EXERCISE 30 minutes and JOINT MOBILIZATION/MFR 8 minutes      Timothy Townsel, PT  11/20/2021, 14:43

## 2021-11-20 NOTE — Telephone Encounter (Signed)
Spoke to wife and gave appt for mri at [facility] on July [facility] at 7:15 am. Referral mailed to pt

## 2021-11-24 ENCOUNTER — Ambulatory Visit (HOSPITAL_COMMUNITY)
Admission: RE | Admit: 2021-11-24 | Discharge: 2021-11-24 | Disposition: A | Discharge status: Still In Hospital | Payer: Medicare Other | Source: Ambulatory Visit

## 2021-11-24 ENCOUNTER — Other Ambulatory Visit: Payer: Self-pay

## 2021-11-24 NOTE — PT Treatment (Signed)
[facility]  Outpatient Physical Therapy  [place], [address]  [phone]  [phone]    Physical Therapy Treatment Note    Date: 11/24/2021  Patient's Name: Steven Henderson  Date of Birth: 1951/03/31      Visit #6/POC: 12/26/21  Authorization:med nec  POC Signed?: yes  POC Ends: 12/26/21  Next Progress Note Due: 30 days      Evaluating Physical Therapist: Mateo Flow, PT, DPT  PT diagnosis/Reason for Referral: thoracic nerve entrapment   Next Scheduled Physician Appointment: TBD  Allergies/Contraindications: none          Subjective: Reports that his back pain was up to an 8/10 over the weekend. Today it is not feeling too bad, rates it at 6/10.     Objective: therex for thoracic mobility and postural strengthening and joint mobilizations as followed    EXERCISE/ACTIVITY NAME REPETITIONS RESISTANCE COMPLETED THIS DOS   UBE   5 minutes - Y   SA foam roll up wall  [employer]   x20 - Y   T-band row  T-band low row  T-band horizontal abduction sitting   x15  X15  x15 Blue  Blue  [place] Y   Seated thoracic rotation  Seated thoracic extension   x15  x15 PB Y   Prone bilateral shoulder extension  Prone bilateral horizontal abduction  Prone bilateral shoulder "Y" flexion  Prone bilateral ER behind head flares  Prone ER to IR compound mobility   x10 with 2" holds        x4 AROM Y   Thoracic PA mobiliations   8 minutes Grade 2-3 N   Open book x10 each With manual overpressure Y   Rib MET 3 rounds of 6 sec holds Manual resistance Y                   Assessment: Pt tolerated exercises well today without complaints of increased pain or symptoms. He notes that he had a good response to the rib MET's done the time before last.    Plan: Continue per PT POC      Total Session Time 35 and Timed code minutes 35  THERAPEUTIC EXERCISE 35 minutes      Rishit Burkhalter, PTA 11/24/2021 16:52

## 2021-11-24 NOTE — PT Treatment (Signed)
[facility]  Outpatient Physical Therapy  [address]  [place], [address]  [phone]  (Fax) [phone]    Physical Therapy Treatment Note    Date: 11/24/2021  Patient's Name: Steven Henderson  Date of Birth: 04-28-1951      Visit #4/POC:8/11  Authorization:med nec  POC Signed?: yes  POC Ends: 8/11  Next Progress Note Due: 30 days      Evaluating Physical Therapist: Marcell Anger, PT, DPTPT diagnosis/Reason for Referral: thoracic nerve entrapment   Next Scheduled Physician Appointment: TBD  Allergies/Contraindications: none          Subjective: No new complaints to mention today.    Objective: therex for thoracic mobility and postural strengthening and joint mobilizations as followed    EXERCISE/ACTIVITY NAME REPETITIONS RESISTANCE COMPLETED THIS DOS   UBE   5 minutes - Y   SA foam roll up wall  [employer]   x20 - Y   T-band row  T-band low row  T-band horizontal abduction sitting   x15  X15  x15 Blue  Blue  [employer] Y   Seated thoracic rotation  Seated thoracic extension   x15  x15 PB Y   Prone bilateral shoulder extension  Prone bilateral horizontal abduction  Prone bilateral shoulder "Y" flexion  Prone bilateral ER behind head flares  Prone ER to IR compound mobility   x10 with 2" holds        x4 AROM Y   Thoracic PA mobiliations   8 minutes Grade 2-3 Y   Open book x10 each With manual overpressure Y                         Assessment: Hypomobile thoracic spine appreciated with exercises today.    Plan: Continue per PT POC      Total Session Time 38 and Timed code minutes 38  THERAPEUTIC EXERCISE 30 minutes and JOINT MOBILIZATION/MFR 8 minutes      Donyetta Ogletree, PTA 11/17/2021 1400

## 2021-11-27 ENCOUNTER — Other Ambulatory Visit: Payer: Self-pay

## 2021-11-27 ENCOUNTER — Ambulatory Visit (HOSPITAL_COMMUNITY)
Admission: RE | Admit: 2021-11-27 | Discharge: 2021-11-27 | Disposition: A | Discharge status: Still In Hospital | Payer: Medicare Other | Source: Ambulatory Visit

## 2021-11-27 NOTE — PT Treatment (Signed)
[facility]  Outpatient Physical Therapy  [place], [address]  [phone]  [phone]    Physical Therapy Treatment Note    Date: 11/27/2021  Patient's Name: Steven Henderson  Date of Birth: January 02, 1951      Visit #7/POC: 12/26/21  Authorization:med nec  POC Signed?:yes  POC Ends:12/26/21  Next Progress Note Due:30 days      Evaluating Physical Therapist:Isahia Hollerbach, PT, DPT  PT diagnosis/Reason for Referral:thoracic nerve entrapment  Next Scheduled Physician Appointment:TBD  Allergies/Contraindications:none          Subjective: Patient reports still feeling "something" in the middle back but overall pain has decreased and is around a 3/10 this past week.  Patient is scheduled for MRI next Wednesday.    Objective:therex for thoracic mobility and postural strengthening and joint mobilizations as followed    EXERCISE/ACTIVITY NAME REPETITIONS RESISTANCE COMPLETED THIS DOS   UBE   5 minutes - Y   SA foam roll up wall  [employer]   x20  x15 Red t-band wrist Y   T-band row  T-band low row  T-band horizontal abduction sitting   x15  X15  x15 Blue  Blue  Blue Y   Seated thoracic rotation  Seated thoracic extension   x15  x15 PB Y   Prone bilateral shoulder extension  Prone bilateral horizontal abduction  Prone bilateral shoulder "Y" flexion  Prone bilateral ER behind head flares  Prone ER to IR compound mobility   x10 with 2" holds        x4 AROM Y   Thoracic PA mobiliations   8 minutes Grade 2-3 N   Open book    Quadruped "thread the needle" x10 each  x12 each With manual overpressure Y   Rib MET 3 rounds of 6 sec holds Manual resistance N   row machine   Lat machine 2x12  2x12 45#  40# Y           Assessment:Patient is showing progress overall with thoracic mobility as evidenced through improved prone horizontal abduction ROM, but still overall limited due to thoracic hypomobility.  Mild improvements with thoracic joint  mobilizations.    Plan:Reassessment next week      Total Session Time 35 and Timed code minutes 35  THERAPEUTIC EXERCISE 35 minutes      Zykeriah Mathia, PT  11/27/2021, 14:46

## 2021-12-02 ENCOUNTER — Ambulatory Visit (HOSPITAL_COMMUNITY)
Admission: RE | Admit: 2021-12-02 | Discharge: 2021-12-02 | Disposition: A | Discharge status: Still In Hospital | Payer: Medicare Other | Source: Ambulatory Visit

## 2021-12-02 ENCOUNTER — Other Ambulatory Visit: Payer: Self-pay

## 2021-12-02 NOTE — PT Treatment (Signed)
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  Patient reports 50% overall improvement with PT with overall reduction in pain, but the same function wise with middle back pain.    Objective:therex for thoracic mobility and postural strengthening and joint mobilizations as followed    Left shoulde AROM: 135 flexion, 120 abduction   Right shoulder AROM: 120 flexion, 120 abduction  Standing MMT: 4-/5 shoulder flexion and abduction bilaterally    JAMAR: 70# right hand, 80# on the left    Patient-Specific Functional Scale:  Problem Score Score   1. Lifting for work 5 5   2. House ADLs like vacuuming  5 6   3. Driving  3 3   Total 4 4         EXERCISE/ACTIVITY NAME REPETITIONS RESISTANCE COMPLETED THIS DOS   UBE   5 minutes - Y   SA foam roll up wall  [employer]   x20  x15 Red t-band wrist Y   T-band row  T-band low row  T-band horizontal abduction sitting   x15  X15  x15 Blue  Blue  Blue Y   Seated thoracic rotation  Seated thoracic extension   x15  x15 PB Y   Prone bilateral shoulder extension  Prone bilateral horizontal abduction  Prone row into ER  Prone bilateral shoulder "Y" flexion  Prone bilateral ER behind head flares  Prone ER to IR compound mobility   x10 with 2" holds          x4 AROM Y   Thoracic PA mobiliations   8 minutes Grade 2-3 N   Open book     Quadruped "thread the needle" x10 each  x12 each With manual overpressure N   Rib MET 3 rounds of 6 sec holds Manual resistance N   row machine   Lat machine 2x12  2x12 45#  40# Y   Scapular diagonals with the green looped band x10 green Y                 Assessment:Patient has met 4 out of 6 goals at this time.  He is showing steady improvement toward LTGs for strength and bilateral shoulder ROM per objective measurements.  Still with significant thoracic hypomobility syndrome with PA glide assessment and limited thoracic rotation as evidenced through open books.  Grip strength has significantly improved and is not symmetrical.  Based on reassessment, I am recommending continued PT 2x a week for 3 more weeks to address remaining strength and thoracic hypomobility deficits.    Plan:Continue 2x a week for 3 more weeks to address remaining strength and thoracic hypomobility deficits.  He has the potential to meet all LTGs with continued PT based on reassessment.      STGs 3 Weeks:  1.  Patient will be independent (with PRN use of HEP handout)  with progressive HEP to maximize gains from PT. (met 12/02/21)   2.  Patient will report less than max pain of 5/10 to aid with participation in physical therapy. (met 12/02/21)     LTGs 6 Weeks:  1.  Patient will improve functional ability with Patient Specific Functional Scale score of at least 6. (not met 12/02/21)   2.  Patient will demonstrate improved B shoulder AROM to at least 20 degrees to aid in ability to perform ADLs independently. (met 12/02/21)  3.  Patient will demonstrate improved R shoulder strength to at least 4+/5 throughout to aid in return to previous level of function. (not met 12/02/21)  4.  Patient will demonstrate improved grip strength with R  UE to at least 65 pounds of pressure to aid in completion of IADLs. (met 12/02/21)    Start of Service:     Re-certification:           From:    Through:       I have reviewed this plan of treatment and  re-certify a continuing need for service.    Referring Provider Signature:       Date:        Treatment Diagnosis:             Total Session Time 42 and Timed code minutes 42  THERAPEUTIC EXERCISE 42 minutes      Steven Henderson, PT  12/02/2021, 15:32

## 2021-12-03 ENCOUNTER — Other Ambulatory Visit (INDEPENDENT_AMBULATORY_CARE_PROVIDER_SITE_OTHER): Payer: Self-pay | Admitting: Internal Medicine

## 2021-12-03 DIAGNOSIS — M25811 Other specified joint disorders, right shoulder: Secondary | ICD-10-CM

## 2021-12-03 IMAGING — MR MRI SHOULDER RT W/O CONTRAST
6 series · 38 of 40 positions shown · IV contrast (gadolinium)
Comparison: None available.

﻿EXAM:  [id number]   MRI SHOULDER RT W/O CONTRAST
INDICATION: 71-year-old with persistent right shoulder pain radiating to right arm. Limited range of motion.  No prior shoulder surgery.
TECHNIQUE: Multiplanar, multisequential MRI of the right shoulder was performed without gadolinium contrast.

[Series 6: T1 · oblique · right · 3.5mm · 0.33mm/px · 7 of 18 slices shown]
[im 1/18]
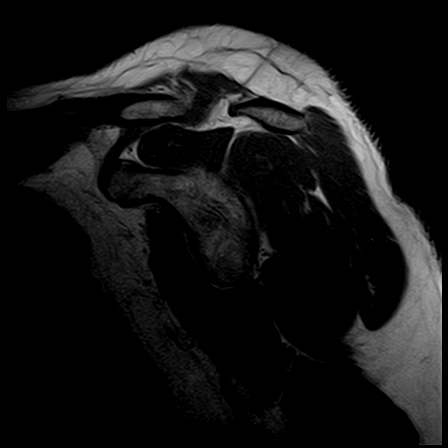
[im 3/18]
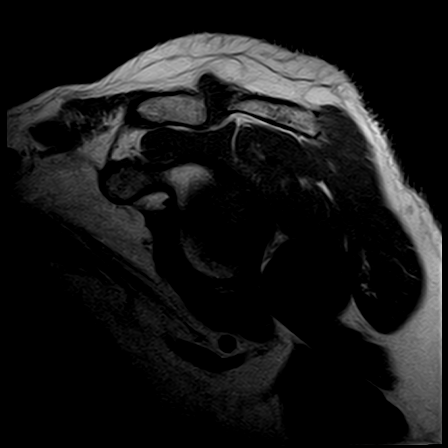
[im 6/18]
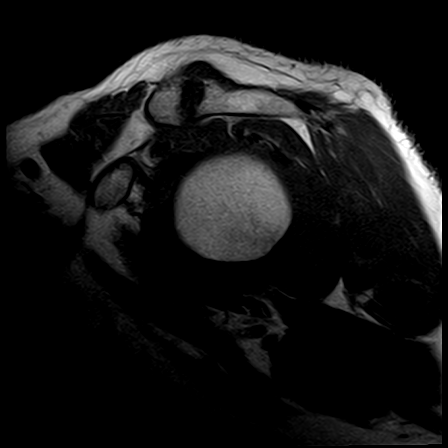
[im 9/18]
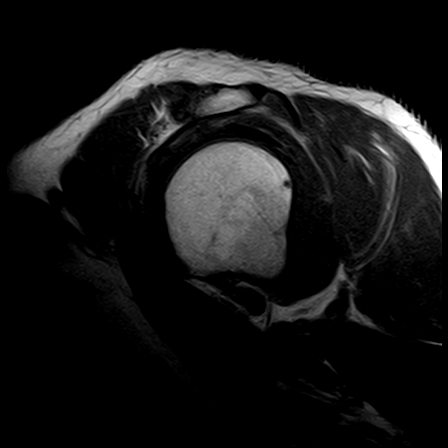
[im 12/18]
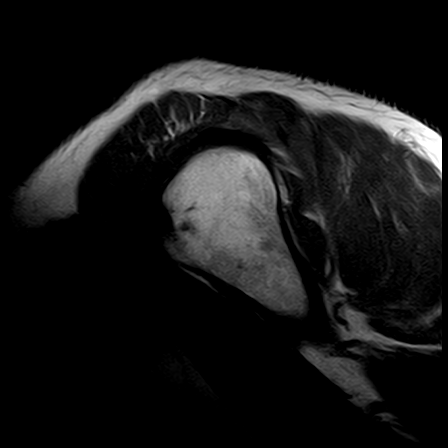
[im 15/18]
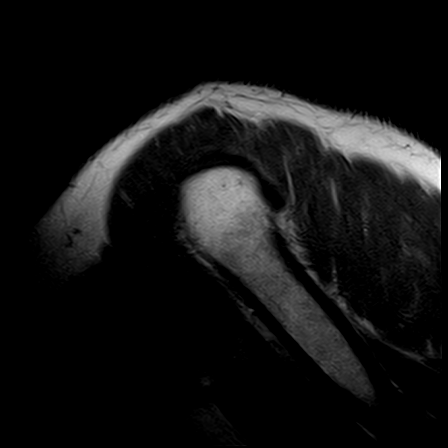
[im 18/18]
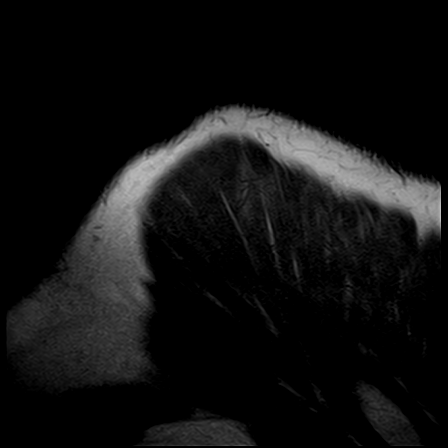

[Series 7: STIR · oblique · right · 3.5mm · 0.47mm/px · 7 of 18 slices shown (1 of 2)]
[im 1/18]
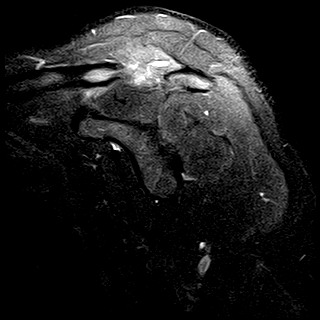
[im 3/18]
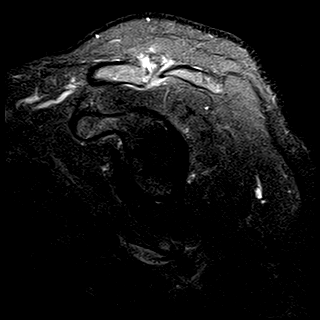
[im 6/18]
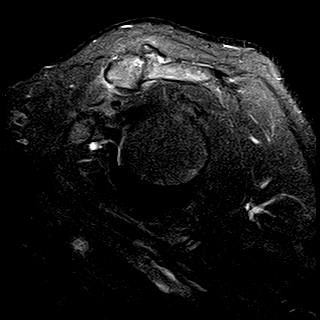
[im 9/18]
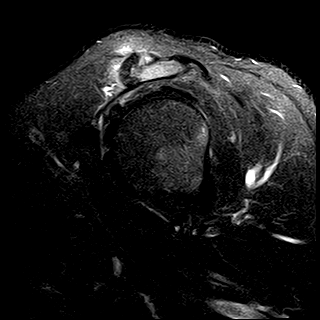
[im 12/18]
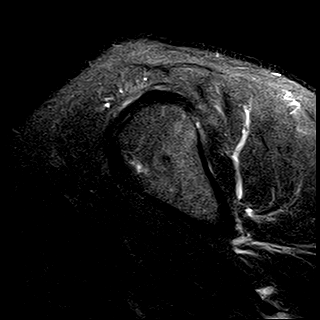
[im 15/18]
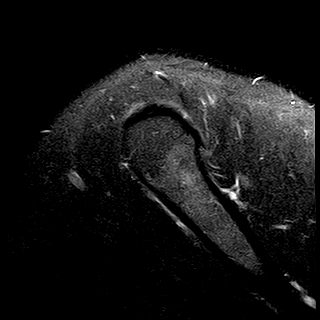
[im 18/18]
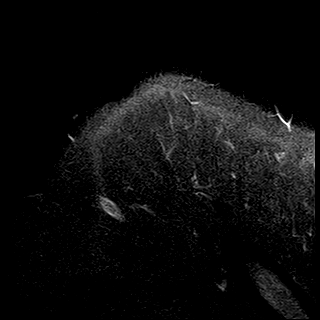

[Series 8: PD fat-sat · axial · right · 4.5mm · 0.50mm/px · z∈[-66,+19]mm · 6 of 18 slices shown (1 of 2)]
[im 1/18]
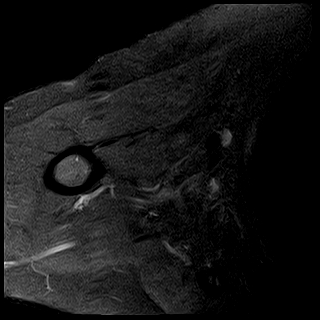
[im 4/18]
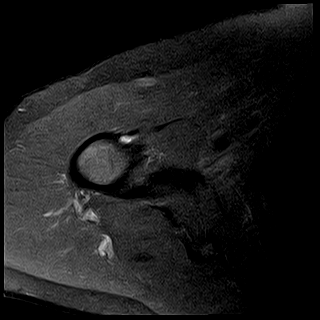
[im 7/18]
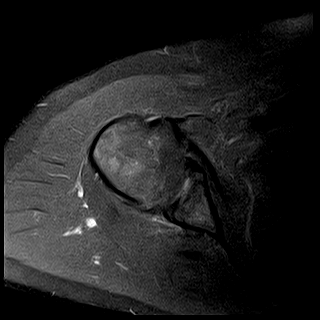
[im 11/18]
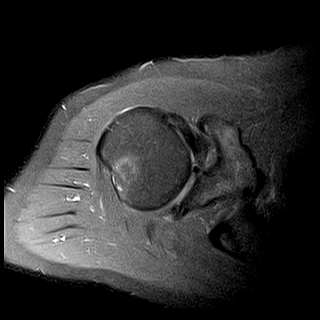
[im 14/18]
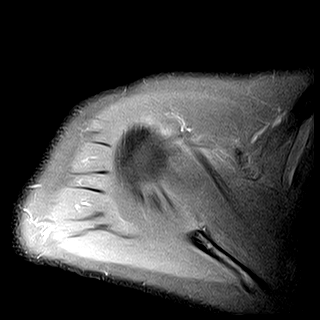
[im 18/18]
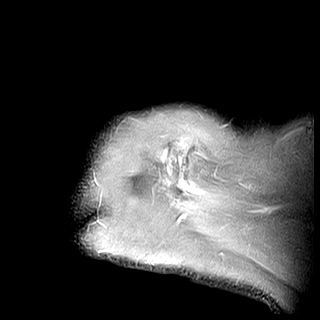

[Series 9: T2 fat-sat · axial · right · 4.0mm · 0.42mm/px · z∈[-76,+28]mm · 8 of 24 slices shown]
[im 1/24]
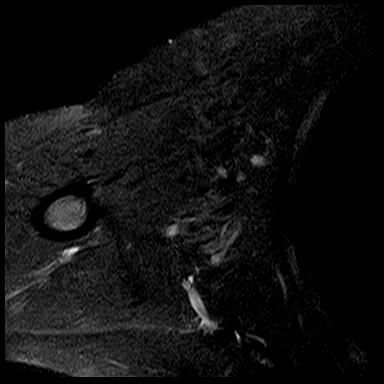
[im 4/24]
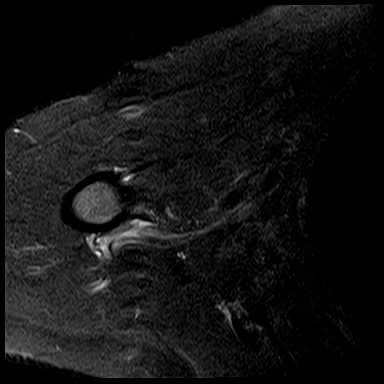
[im 7/24]
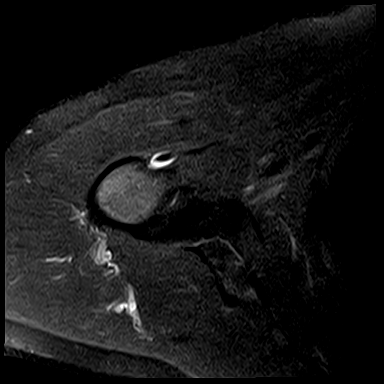
[im 10/24]
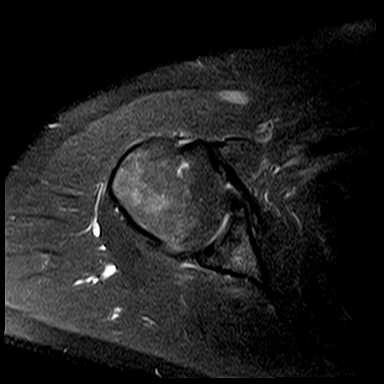
[im 14/24]
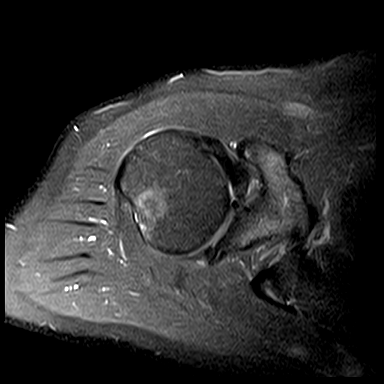
[im 17/24]
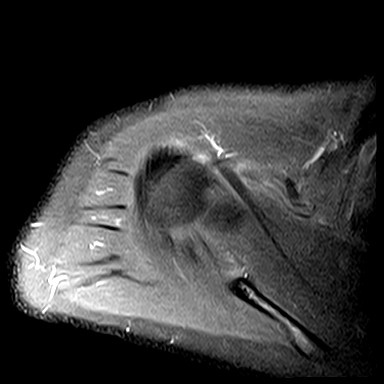
[im 20/24]
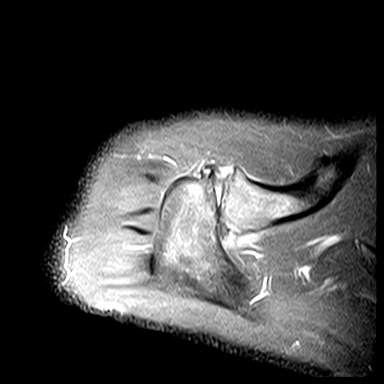
[im 24/24]
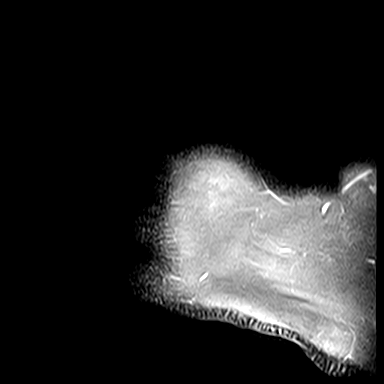

[Series 10: PD fat-sat · oblique · right · 3.5mm · 0.47mm/px · 6 of 18 slices shown (2 of 2)]
[im 1/18]
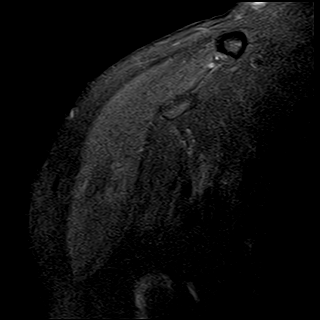
[im 4/18]
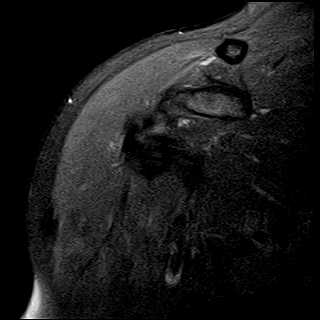
[im 7/18]
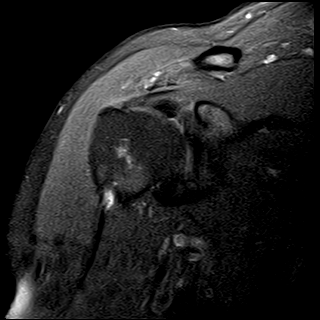
[im 11/18]
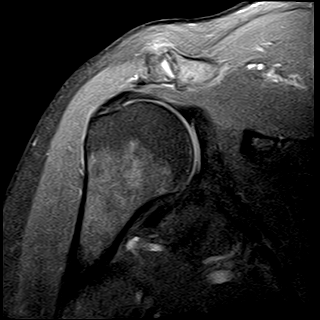
[im 14/18]
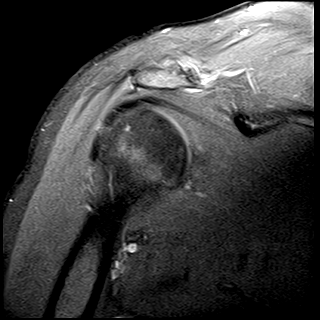
[im 18/18]
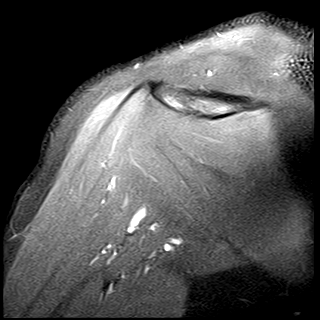

[Series 11: STIR · oblique · right · 3.5mm · 0.47mm/px · 4 of 18 slices shown (2 of 2)]
[im 1/18]
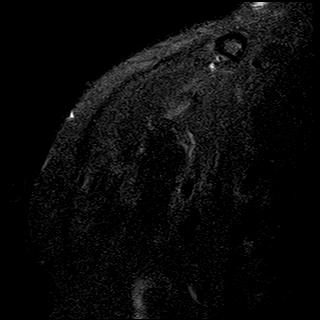
[im 4/18]
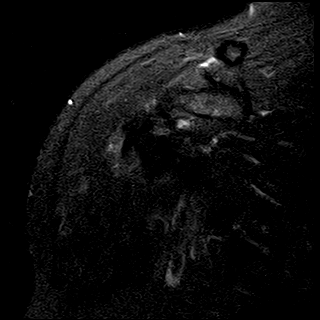
[im 7/18]
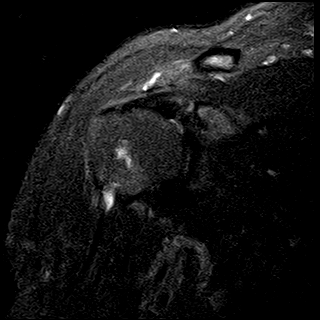
[im 11/18]
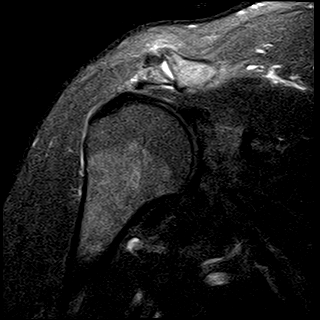

[38 of 40 positions shown; findings below may reference images not displayed]

FINDINGS: No acute bony lesions are seen.  Significant degenerative arthritic changes of AC joint with mild inflammatory changes and osteophyte formation are noted mildly impinging on the subacromion space and the supraspinatus.  Sloping acromion process also mildly impinges on the supraspinatus.  Supraspinatus tendinopathy is noted with minimal fluid in the subacromial bursa.  No evidence of rotator cuff tear is seen.

The glenoid labrum and long head of biceps tendon are intact.
IMPRESSION: 1. No acute bone changes. 

2. Degenerative arthritic changes of AC joint and sloping acromion process are mildly impinging on the supraspinatus tendon with evidence of mild supraspinatus tendinitis.  No evidence of tendon tear.

3. Glenoid labrum and long head of biceps tendon are intact.

## 2021-12-03 IMAGING — MR MRI THORACIC SPINE WITHOUT CONTRAST
4 of 5 series · 26 of 48 positions shown · IV contrast (gadolinium)
Comparison: None available.

﻿EXAM:  [id number]   MRI THORACIC SPINE WITHOUT CONTRAST
INDICATION: 71-year-old with history of persistent mid back pain between shoulder blades.  No history of trauma, malignancy or back surgery.
TECHNIQUE: Multiplanar, multisequential MRI of the thoracic spine was performed without gadolinium contrast.

[Series 5: T2 · sagittal · 4.0mm · 0.78mm/px · 8 of 13 slices shown (1 of 2)]
[im 1/13]
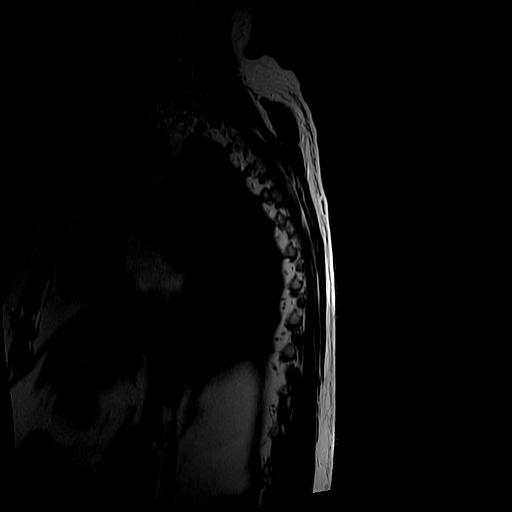
[im 2/13]
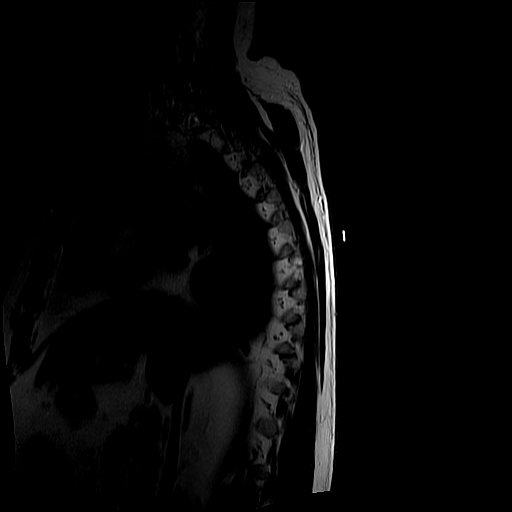
[im 5/13]
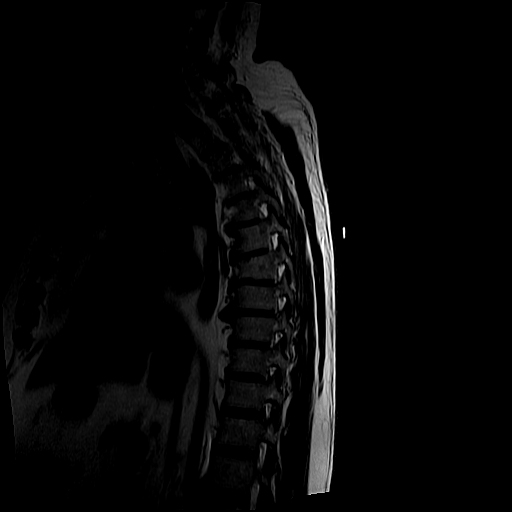
[im 6/13]
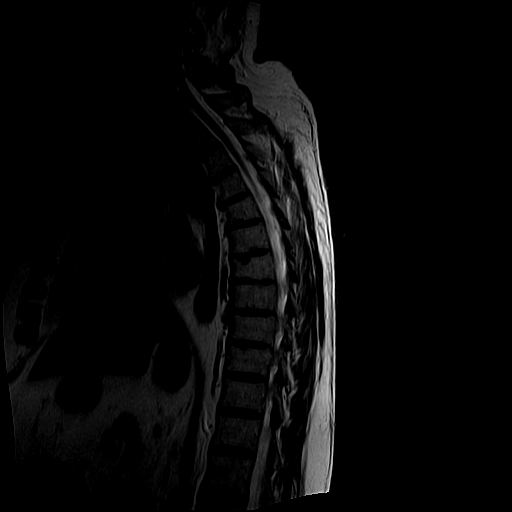
[im 7/13]
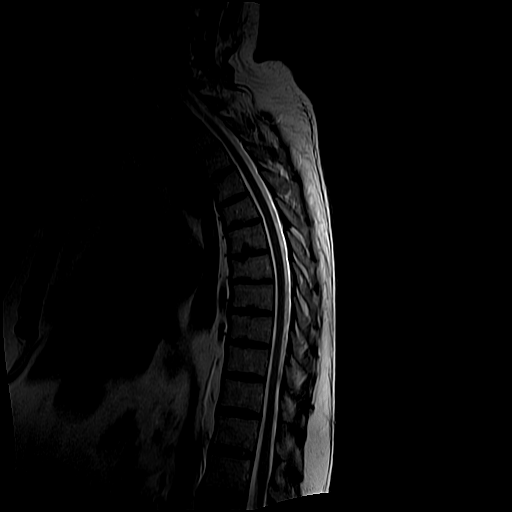
[im 9/13]
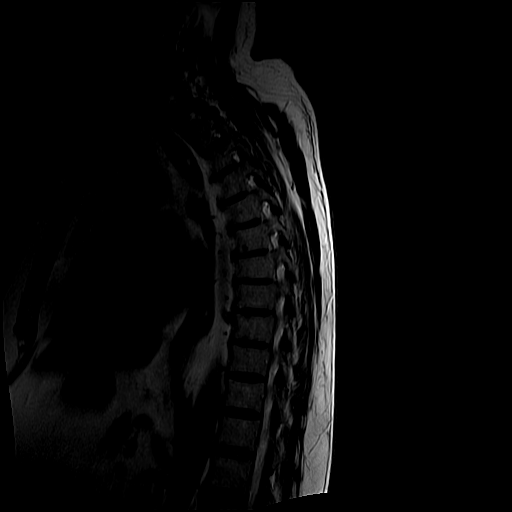
[im 11/13]
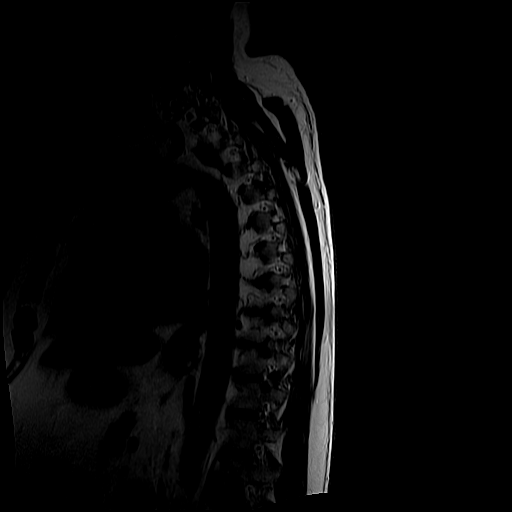
[im 13/13]
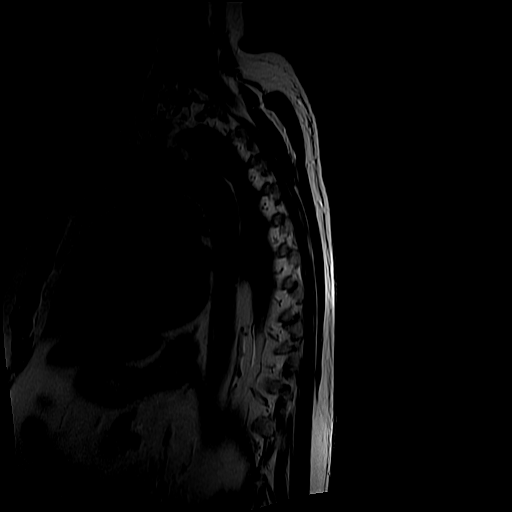

[Series 6: T1 · sagittal · 4.0mm · 0.78mm/px · 6 of 13 slices shown (1 of 2)]
[im 1/13]
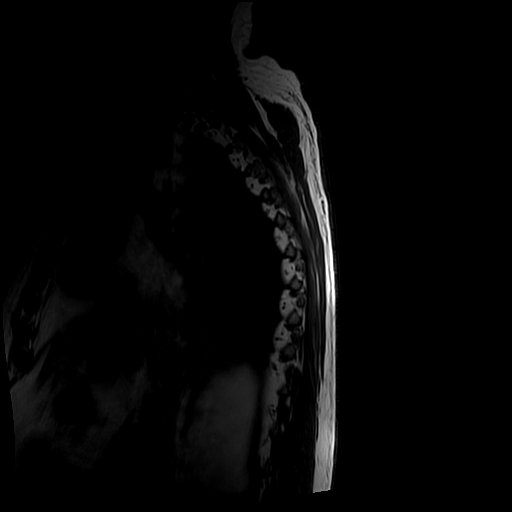
[im 2/13]
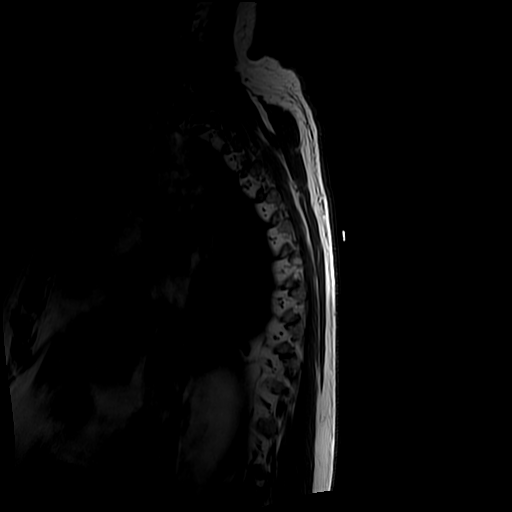
[im 5/13]
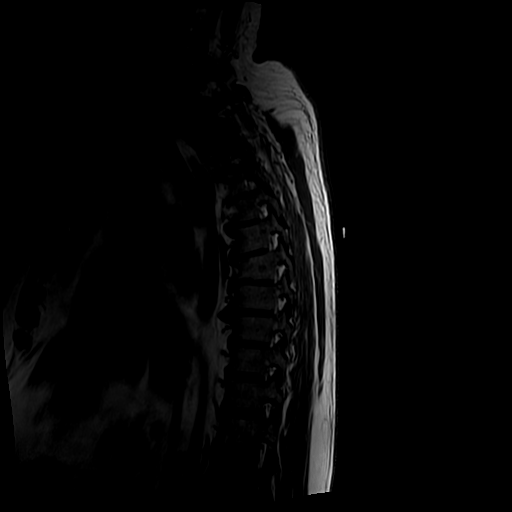
[im 6/13]
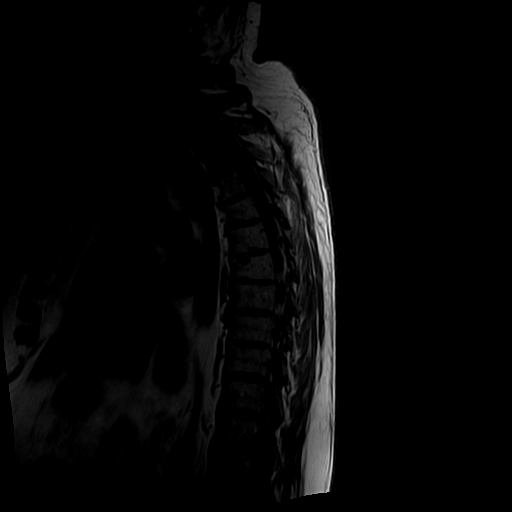
[im 7/13]
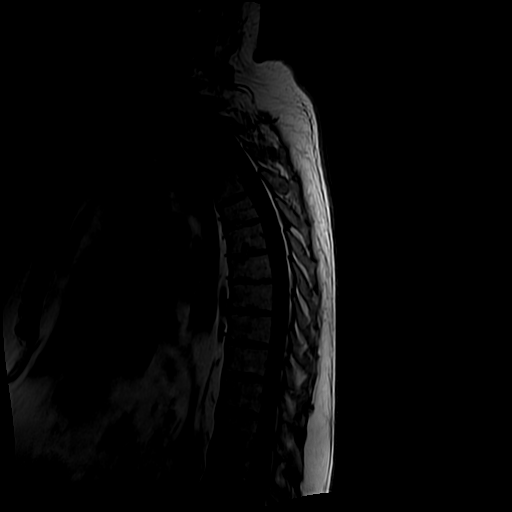
[im 11/13]
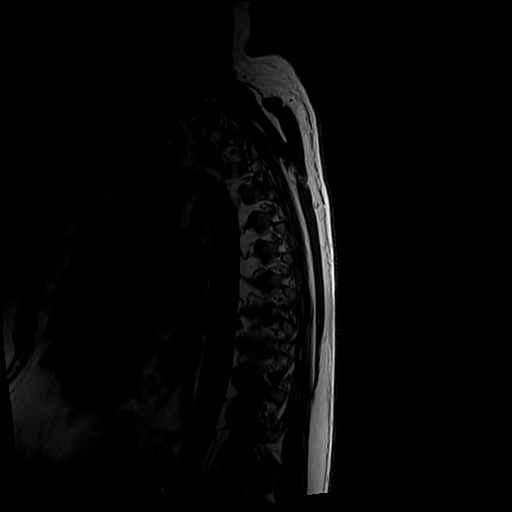

[Series 8: T2 · axial · 4.0mm · 0.62mm/px · z∈[-263,-74]mm · 9 of 12 slices shown (2 of 2)]
[im 1/12]
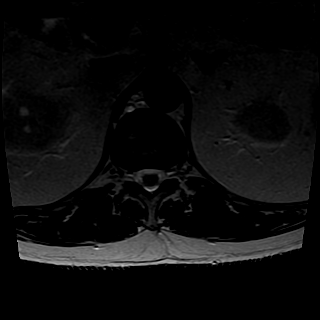
[im 2/12]
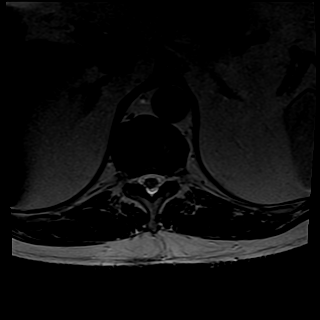
[im 3/12]
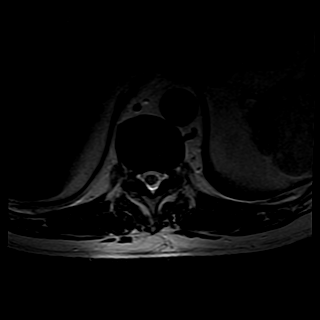
[im 5/12]
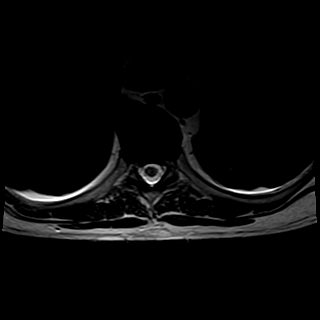
[im 6/12]
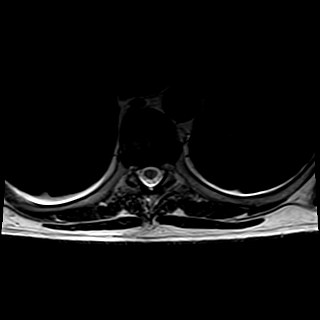
[im 7/12]
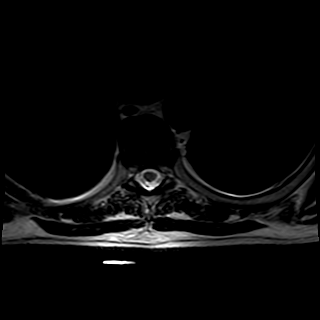
[im 9/12]
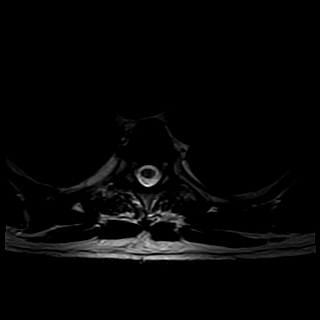
[im 10/12]
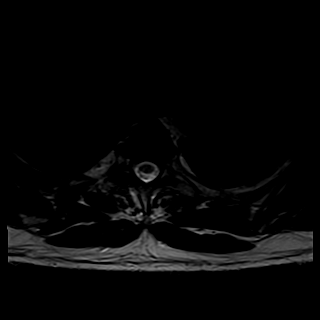
[im 12/12]
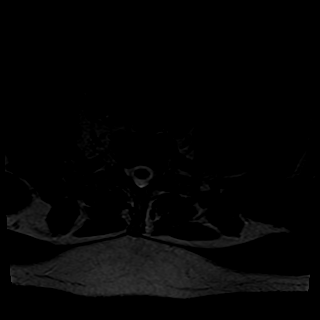

[Series 9: T1 · axial · 4.0mm · 0.62mm/px · z∈[-235,-92]mm · 3 of 12 slices shown (2 of 2)]
[im 2/12]
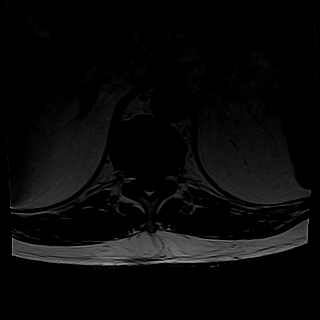
[im 6/12]
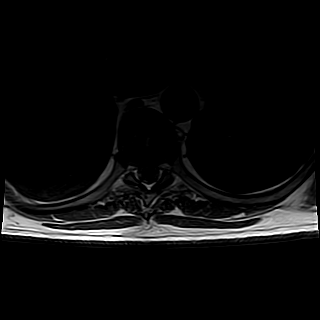
[im 10/12]
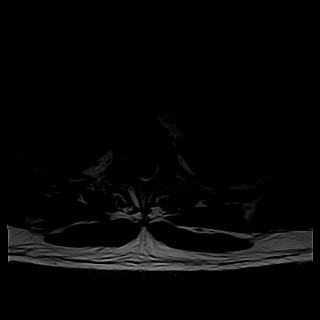

[26 of 48 positions shown; findings below may reference images not displayed]

FINDINGS: No acute or focal bone changes of thoracic vertebral bodies are noted.

Mild degenerative disc changes of multiple mid and lower thoracic discs are noted with moderate anterior osteophyte formation in the lower thoracic spine.  However, no significant degree of spinal stenosis or foraminal stenosis is seen in the thoracic region.

Thoracic spinal cord shows no focal lesions.  Paravertebral soft tissues are unremarkable.

In the upper abdomen, multiple small cysts of both kidneys and mild splenomegaly are noted.
IMPRESSION: 1.  No focal bone changes of thoracic vertebrae. 

2. Mild degenerative changes of multiple mid and lower thoracic discs.  No evidence of spinal stenosis or foraminal stenosis is seen in the thoracic region.

3. No focal lesions of thoracic spinal cord.

## 2021-12-04 ENCOUNTER — Other Ambulatory Visit: Payer: Self-pay

## 2021-12-04 ENCOUNTER — Ambulatory Visit
Admission: RE | Admit: 2021-12-04 | Discharge: 2021-12-04 | Disposition: A | Discharge status: Still In Hospital | Payer: Medicare Other | Source: Ambulatory Visit | Attending: Internal Medicine | Admitting: Internal Medicine

## 2021-12-04 NOTE — PT Treatment (Signed)
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 He received report from MRI and reports he has a "tendon" problem and is being referred to orthopedist.      Objective: therex for thoracic mobility and postural strengthening and joint mobilizations as followed           EXERCISE/ACTIVITY NAME REPETITIONS RESISTANCE COMPLETED THIS DOS   UBE   (one minute forward/one minute retro) 5 minutes - Y   SA foam roll up wall  [employer]     x20  x15 Red t-band wrist Y   T-band row  T-band low row  T-band horizontal abduction sitting    x15  X15  x15 Blue  Blue  Blue Y   Seated thoracic rotation  Seated thoracic extension    x15  x15 PB Y   Prone bilateral shoulder extension  Prone bilateral horizontal abduction  Prone row into ER  Prone bilateral shoulder "Y" flexion  Prone bilateral ER behind head flares  Prone ER to IR compound mobility    x10 with 2" holds             x4 AROM N    Thoracic PA mobiliations    8 minutes Grade 2-3 N    Open book    Quadruped "thread the needle" x10 each  x12 each With manual overpressure N    Rib MET  3 rounds of 6 sec holds Manual resistance N    row machine   Lat machine 2x15  2x15 50#  40# Y   Scapular diagonals with the green looped band x10 green Y                Assessment: Patient tolerated session well today. No increase in pain reported at end of session.     Plan: Continue per POC.    STGs 3 Weeks:  1.  Patient  will be independent (with PRN use of HEP handout) with progressive HEP to maximize gains from PT. (met 12/02/21)   2.  Patient will report less than max pain of 5/10 to aid with participation in physical therapy. (met 12/02/21)      LTGs 6 Weeks:  1.  Patient will improve functional ability with Patient Specific Functional Scale score of at least 6. (not met 12/02/21)   2.  Patient will demonstrate improved B shoulder AROM to at least 20 degrees to aid in ability to perform ADLs independently. (met 12/02/21)  3.  Patient will demonstrate improved R shoulder strength to at least 4+/5 throughout to aid in return to previous level of function. (not met 12/02/21)  4.  Patient will demonstrate improved grip strength with R  UE to at least 65 pounds of pressure to aid in completion of IADLs. (met 12/02/21)    Total Session Time 35 and Timed code minutes 35  THERAPEUTIC EXERCISE 35 minutes  [employer], PTA 12/04/2021 15:39

## 2021-12-08 ENCOUNTER — Ambulatory Visit (HOSPITAL_COMMUNITY)
Admission: RE | Admit: 2021-12-08 | Discharge: 2021-12-08 | Disposition: A | Discharge status: Still In Hospital | Payer: Medicare Other | Source: Ambulatory Visit

## 2021-12-08 ENCOUNTER — Other Ambulatory Visit: Payer: Self-pay

## 2021-12-08 DIAGNOSIS — G589 Mononeuropathy, unspecified: Secondary | ICD-10-CM | POA: Insufficient documentation

## 2021-12-08 DIAGNOSIS — M546 Pain in thoracic spine: Secondary | ICD-10-CM | POA: Insufficient documentation

## 2021-12-08 DIAGNOSIS — M40294 Other kyphosis, thoracic region: Secondary | ICD-10-CM | POA: Insufficient documentation

## 2021-12-08 NOTE — PT Treatment (Signed)
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 No new complaints today.     Objective: therex for thoracic mobility and postural strengthening as noted below.     EXERCISE/ACTIVITY NAME REPETITIONS RESISTANCE COMPLETED THIS DOS   UBE   (one minute forward/one minute retro) 5 minutes - Y   SA foam roll up wall  [employer]     x20  x15 Red t-band wrist Y   T-band row  T-band low row  T-band horizontal abduction sitting    x15  X15  x15 Blue  Blue  Blue Y   Seated thoracic rotation  Seated thoracic extension    x15  x15 PB Y   Prone bilateral shoulder extension  Prone bilateral horizontal abduction  Prone row into ER  Prone bilateral shoulder "Y" flexion  Prone bilateral ER behind head flares  Prone ER to IR compound mobility    x10 with 2" holds             x4 AROM N    Thoracic PA mobiliations    8 minutes Grade 2-3 N    Open book    Quadruped "thread the needle" x10 each  x12 each With manual overpressure N    Rib MET  3 rounds of 6 sec holds Manual resistance N    row machine   Lat machine 2x15  2x15 50#  40# Y   Scapular diagonals with the green looped band x10 green Y                Assessment: Patient tolerated session well today.          STGs 3 Weeks:  1.  Patient will be independent (with PRN use of HEP handout) with progressive HEP to maximize gains from PT. (met 12/02/21)   2.  Patient will report less than max  pain of 5/10 to aid with participation in physical therapy. (met 12/02/21)      LTGs 6 Weeks:  1.  Patient will improve functional ability with Patient Specific Functional Scale score of at least 6. (not met 12/02/21)   2.  Patient will demonstrate improved B shoulder AROM to at least 20 degrees to aid in ability to perform ADLs independently. (met 12/02/21)  3.  Patient will demonstrate improved R shoulder strength to at least 4+/5 throughout to aid in return to previous level of function. (not met 12/02/21)  4.  Patient will demonstrate improved grip strength with R  UE to at least 65 pounds of pressure to aid in completion of IADLs. (met 12/02/21)    Plan: Continue per PT POC.    Total Session Time 35 and Timed code minutes 35  THERAPEUTIC EXERCISE 35 minutes    Amedeo Detweiler, PTA 12/08/2021 17:14

## 2021-12-11 ENCOUNTER — Ambulatory Visit (HOSPITAL_COMMUNITY)
Admission: RE | Admit: 2021-12-11 | Discharge: 2021-12-11 | Disposition: A | Discharge status: Still In Hospital | Payer: Medicare Other | Source: Ambulatory Visit

## 2021-12-11 ENCOUNTER — Other Ambulatory Visit: Payer: Self-pay

## 2021-12-11 NOTE — PT Treatment (Signed)
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 Reports burning sensation in lower aspect of shoulder. States the burning decreases as the day goes pain and then he feels pain.  Rates the pain 5/10. States he has difficulty raising his arm out from his body(demonstrates shoulder abd).     Objective: therex for thoracic mobility and postural strengthening as noted below.     EXERCISE/ACTIVITY NAME REPETITIONS RESISTANCE COMPLETED THIS DOS   UBE   (one minute forward/one minute retro) 5 minutes - Y   SA foam roll up wall  [employer]     x20  x15 Red t-band wrist Y  y   T-band row  T-band low row  T-band horizontal abduction sitting    x15  X15  x15 Blue  Blue  Blue N  Y  y   Seated thoracic rotation  Seated thoracic extension    x15  x15 PB Y   Prone bilateral shoulder extension  Prone bilateral horizontal abduction  Prone row into ER  Prone bilateral shoulder "Y" flexion  Prone bilateral ER behind head flares  Prone ER to IR compound mobility    x10 with 2" holds              x4 AROM N    Thoracic PA mobiliations    8 minutes Grade 2-3 N    Open book    Quadruped "thread the needle" x10 each  x12 each With manual overpressure N    Rib MET  3 rounds of 6 sec holds Manual resistance N    row machine   Lat machine 2x15  2x15 50#  40# Y  Y   Scapular diagonals with the green looped band x10 green Y                    Assessment: Tolerated well.  Reports no increase pain with exercises. Noted decrease ROM (R)>(L) with snow angels.           STGs 3 Weeks:  1.  Patient will be independent (with PRN use of HEP handout) with progressive HEP to maximize gains from PT. (met 12/02/21)   2.  Patient will report less than max pain of 5/10 to aid with participation in physical therapy. (met 12/02/21)      LTGs 6 Weeks:  1.  Patient will improve functional ability with Patient Specific Functional Scale score of at least 6. (not met 12/02/21)   2.  Patient will demonstrate improved B shoulder AROM to at least 20 degrees to aid in ability to perform ADLs independently. (met 12/02/21)  3.  Patient will demonstrate improved R shoulder strength to at least 4+/5 throughout to aid in return to previous level of function. (not met 12/02/21)  4.  Patient will demonstrate improved grip strength with R  UE to at least 65 pounds of pressure to aid in  completion of IADLs. (met 12/02/21)     Plan:Continue per PT POC.           Total Session Time 30 and Timed code minutes 30  THERAPEUTIC EXERCISE 30 minutes      Carlton Adam, PTA  12/11/2021, 16:08

## 2021-12-15 ENCOUNTER — Other Ambulatory Visit: Payer: Self-pay

## 2021-12-15 ENCOUNTER — Ambulatory Visit (HOSPITAL_COMMUNITY)
Admission: RE | Admit: 2021-12-15 | Discharge: 2021-12-15 | Disposition: A | Discharge status: Still In Hospital | Payer: Medicare Other | Source: Ambulatory Visit

## 2021-12-15 NOTE — PT Treatment (Signed)
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 Rates his pain at 6/10 today. Goes to see Dr. Lilia Pro on Tuesday of next week.    Objective: therex for thoracic mobility and postural strengthening as noted below.     EXERCISE/ACTIVITY NAME REPETITIONS RESISTANCE COMPLETED THIS DOS   UBE   (one minute forward/one minute retro) 5 minutes - Y   SA foam roll up wall  [employer]     x20  x15 Red t-band wrist Y  y   T-band row  T-band low row  T-band horizontal abduction sitting    x15  X15  x15 Blue  Blue  Blue N  Y  y   Seated thoracic rotation  Seated thoracic extension    x15  x15 PB Y   Prone bilateral shoulder extension  Prone bilateral horizontal abduction  Prone row into ER  Prone bilateral shoulder "Y" flexion  Prone bilateral ER behind head flares  Prone ER to IR compound mobility    x10 with 2" holds              x4 AROM N    Thoracic PA mobiliations    8 minutes Grade 2-3 N    Open book    Quadruped "thread the needle" x10 each  x12 each With manual overpressure N    Rib MET  3 rounds of 6 sec holds Manual resistance N    row machine   Lat machine 2x15  2x15 50#  40# Y  Y   Scapular diagonals with the green looped band x10 green Y                    Assessment: Limited strengthening exercises due to increased pain.      STGs 3 Weeks:  1.  Patient will be independent (with PRN use of HEP handout) with  progressive HEP to maximize gains from PT. (met 12/02/21)   2.  Patient will report less than max pain of 5/10 to aid with participation in physical therapy. (met 12/02/21)      LTGs 6 Weeks:  1.  Patient will improve functional ability with Patient Specific Functional Scale score of at least 6. (not met 12/02/21)   2.  Patient will demonstrate improved B shoulder AROM to at least 20 degrees to aid in ability to perform ADLs independently. (met 12/02/21)  3.  Patient will demonstrate improved R shoulder strength to at least 4+/5 throughout to aid in return to previous level of function. (not met 12/02/21)  4.  Patient will demonstrate improved grip strength with R  UE to at least 65 pounds of pressure to aid in completion of IADLs. (met 12/02/21)     Plan: Continue per PT POC.           Total Session Time 30 and Timed code  minutes 30  THERAPEUTIC EXERCISE 30 minutes      Chealsey Miyamoto, PTA 12/15/2021 15:40

## 2021-12-18 ENCOUNTER — Ambulatory Visit
Admission: RE | Admit: 2021-12-18 | Discharge: 2021-12-18 | Disposition: A | Discharge status: Still In Hospital | Payer: Medicare Other | Source: Ambulatory Visit | Attending: Internal Medicine | Admitting: Internal Medicine

## 2021-12-18 ENCOUNTER — Other Ambulatory Visit: Payer: Self-pay

## 2021-12-18 NOTE — PT Treatment (Addendum)
[facility]  Outpatient Physical Therapy  [place], [address]  [phone]  [phone]    Physical Therapy Treatment Note    Date: 12/18/2021  Patient's Name: Steven Henderson  Date of Birth: [birth year]    Visit #12/POC: 12/26/21  Authorization:med nec  POC Signed?: yes  POC Ends: 12/26/21  Next Progress Note Due:         Evaluating Physical Therapist: Mateo Flow, PT, DPT  PT diagnosis/Reason for Referral: thoracic nerve entrapment   Next Scheduled Physician Appointment: TBD  Allergies/Contraindications: none     DISCHARGE NOTE  Patient has not followed up with PT and will be discharged at this time.  See note below to reassessment of goals and objective measurements.  Interventions included therex for the treatment of thoracic nerve entrapment.   Date of service: 11/07/21 - 12/18/21.    Subjective: Patient reports he has had a "place come up" on his shoulder. He relates he has had no injury to shoulder but states it started out burning and has turned into pain. Pain range this week 4-8/10. He describes area around lesion as being "numb" in the mornings. Has appointment with Dr Tamala Julian (PCP) next Wednesday and with ortho on Monday.    Objective: re-assessment of progress toward goals     EXERCISE/ACTIVITY NAME REPETITIONS RESISTANCE COMPLETED THIS DOS   UBE   (one minute forward/one minute retro) 5 minutes - n   SA foam roll up wall  [employer]     x20  x15 Red t-band wrist N  N     T-band row  T-band low row  T-band horizontal abduction sitting    x15  X15  x15 Blue  Blue  Blue N  n  n   Seated thoracic rotation  Seated thoracic extension    x15  x15 PB n   Prone bilateral shoulder extension  Prone bilateral horizontal abduction  Prone row into ER  Prone bilateral shoulder "Y" flexion  Prone bilateral ER behind head flares  Prone ER to IR compound mobility    x10 with 2" holds              x4 AROM N    Thoracic PA mobiliations    8 minutes Grade 2-3 N    Open  book    Quadruped "thread the needle" x10 each  x12 each With manual overpressure N    Rib MET  3 rounds of 6 sec holds Manual resistance N    row machine   Lat machine 2x15  2x15 50#  40# n   Scapular diagonals with the green looped band x10 green n                  Patient-Specific Functional Scale:  Problem Score   1. Lifting for work 10   2. House ADLs like vacuuming  10   3. Driving  10   Total 10     MMT flexion: 4-/5            Abduction: 4-/5            IR: 3/5            ER: 4-/5  Assessment: Patient has participated with 6 weeks PT. He initially reported improvement in pain control however this has worsened this week. He relates he has not really seen a lot of improvement in problem since beginning PT. Patient estimates 40% overall improvement since  beginning therapy. He reports pain exacerbation since area came up on back of right shoulder. Patient does have reddened, scabbed over area along right scapula. He does not recall injury to this area. He has follow up with orthopedist on Monday of next week. He is compliant with home exercise program. With patient specific scale, patient self reports no issues with any of the previously identified functional measures. He states pain level is biggest issue right now.       STGs 3 Weeks:  1.  Patient will be independent (with PRN use of HEP handout) with progressive HEP to maximize gains from PT. (met 12/02/21)   2.  Patient will report less than max pain of 5/10 to aid with participation in physical therapy. (Had initially met, now reports pain up to 8/10) 12/18/21     LTGs 6 Weeks:  1.  Patient will improve functional ability with Patient Specific Functional Scale score of at least 6. Met 12/18/21  2.  Patient will demonstrate improved B shoulder AROM to at least 20 degrees to aid in ability to perform ADLs independently. (met 12/02/21)  3.  Patient will demonstrate improved R shoulder strength to at least 4+/5 throughout to aid in return to previous level of  function. (not met 12/18/21)  4.  Patient will demonstrate improved grip strength with R  UE to at least 65 pounds of pressure to aid in completion of IADLs. (met 12/02/21)     Plan:   POC good thru 8/11. Has follow up with ortho on Monday.            Total Session Time 25 and Timed code minutes 25  THERAPEUTIC EXERCISE 25 minutes      Ashleigh Pruett, PTA 12/18/2021 15:02

## 2021-12-23 ENCOUNTER — Other Ambulatory Visit: Payer: Medicare Other | Attending: Internal Medicine

## 2021-12-23 ENCOUNTER — Other Ambulatory Visit: Payer: Self-pay

## 2021-12-23 DIAGNOSIS — N401 Enlarged prostate with lower urinary tract symptoms: Secondary | ICD-10-CM | POA: Insufficient documentation

## 2021-12-23 DIAGNOSIS — R338 Other retention of urine: Secondary | ICD-10-CM | POA: Insufficient documentation

## 2021-12-23 DIAGNOSIS — I10 Essential (primary) hypertension: Secondary | ICD-10-CM

## 2021-12-23 LAB — COMPREHENSIVE METABOLIC PANEL, NON-FASTING
ALBUMIN/GLOBULIN RATIO: 1.4 (ref 0.8–1.4)
ALBUMIN: 4.3 g/dL (ref 3.5–5.7)
ALKALINE PHOSPHATASE: 55 U/L (ref 34–104)
ALT (SGPT): 27 U/L (ref 7–52)
ANION GAP: 6 mmol/L — ABNORMAL LOW (ref 10–20)
AST (SGOT): 23 U/L (ref 13–39)
BILIRUBIN TOTAL: 0.6 mg/dL (ref 0.3–1.2)
BUN/CREA RATIO: 22 (ref 6–22)
BUN: 23 mg/dL (ref 7–25)
CALCIUM, CORRECTED: 9.7 mg/dL (ref 8.9–10.8)
CALCIUM: 10 mg/dL (ref 8.6–10.3)
CHLORIDE: 103 mmol/L (ref 98–107)
CO2 TOTAL: 28 mmol/L (ref 21–31)
CREATININE: 1.06 mg/dL (ref 0.60–1.30)
ESTIMATED GFR: 75 mL/min/{1.73_m2} (ref >59)
GLOBULIN: 3 (ref 2.9–5.4)
GLUCOSE: 184 mg/dL — ABNORMAL HIGH (ref 74–109)
OSMOLALITY, CALCULATED: 282 mOsm/kg (ref 270–290)
POTASSIUM: 4 mmol/L (ref 3.5–5.1)
PROTEIN TOTAL: 7.3 g/dL (ref 6.4–8.9)
SODIUM: 137 mmol/L (ref 136–145)

## 2021-12-23 LAB — URINALYSIS, MACRO/MICRO
BILIRUBIN: NEGATIVE mg/dL
BLOOD: NEGATIVE mg/dL
GLUCOSE: 100 mg/dL — AB
KETONES: NEGATIVE mg/dL
LEUKOCYTES: NEGATIVE WBCs/uL
NITRITE: NEGATIVE
PH: 6 (ref 5.0–9.0)
PROTEIN: 30 mg/dL — AB
SPECIFIC GRAVITY: 1.029 (ref 1.002–1.030)
UROBILINOGEN: 2 mg/dL — AB

## 2021-12-23 LAB — CBC
HCT: 44.2 % (ref 42.0–51.0)
HGB: 15.5 g/dL (ref 13.5–18.0)
MCH: 31.4 pg (ref 27.0–32.0)
MCHC: 35.2 g/dL (ref 32.0–36.0)
MCV: 89.2 fL (ref 78.0–99.0)
MPV: 9.7 fL (ref 7.4–10.4)
PLATELETS: 169 10*3/uL (ref 140–440)
RBC: 4.95 10*6/uL (ref 4.20–6.00)
RDW: 14 % (ref 11.6–14.8)
WBC: 9.3 10*3/uL (ref 4.0–10.5)
WBCS UNCORRECTED: 9.3 10*3/uL

## 2021-12-23 LAB — MAGNESIUM: MAGNESIUM: 1.7 mg/dL — ABNORMAL LOW (ref 1.9–2.7)

## 2021-12-23 LAB — URINALYSIS, MICROSCOPIC
RBCS: 1 /hpf (ref <4)
WBCS: 1 /hpf (ref <6)

## 2021-12-24 ENCOUNTER — Ambulatory Visit (INDEPENDENT_AMBULATORY_CARE_PROVIDER_SITE_OTHER): Payer: Medicare Other | Admitting: Internal Medicine

## 2021-12-24 ENCOUNTER — Ambulatory Visit (HOSPITAL_COMMUNITY): Payer: Self-pay

## 2021-12-24 ENCOUNTER — Encounter (INDEPENDENT_AMBULATORY_CARE_PROVIDER_SITE_OTHER): Payer: Self-pay | Admitting: Internal Medicine

## 2021-12-24 VITALS — BP 159/83 | HR 81 | Ht 68.0 in | Wt 203.2 lb

## 2021-12-24 DIAGNOSIS — E1159 Type 2 diabetes mellitus with other circulatory complications: Secondary | ICD-10-CM

## 2021-12-24 DIAGNOSIS — E1169 Type 2 diabetes mellitus with other specified complication: Secondary | ICD-10-CM

## 2021-12-24 DIAGNOSIS — E782 Mixed hyperlipidemia: Secondary | ICD-10-CM

## 2021-12-24 DIAGNOSIS — F411 Generalized anxiety disorder: Secondary | ICD-10-CM

## 2021-12-24 DIAGNOSIS — R972 Elevated prostate specific antigen [PSA]: Secondary | ICD-10-CM

## 2021-12-24 DIAGNOSIS — E119 Type 2 diabetes mellitus without complications: Secondary | ICD-10-CM

## 2021-12-24 DIAGNOSIS — N521 Erectile dysfunction due to diseases classified elsewhere: Secondary | ICD-10-CM

## 2021-12-24 DIAGNOSIS — I152 Hypertension secondary to endocrine disorders: Secondary | ICD-10-CM

## 2021-12-24 MED ORDER — TELMISARTAN 80 MG TABLET
80.0000 mg | ORAL_TABLET | Freq: Every day | ORAL | 4 refills | Status: DC
Start: 2021-12-24 — End: 2022-02-04

## 2021-12-24 MED ORDER — TADALAFIL 5 MG TABLET
5.0000 mg | ORAL_TABLET | ORAL | 2 refills | Status: DC | PRN
Start: 2021-12-24 — End: 2022-02-04

## 2021-12-24 MED ORDER — METHOCARBAMOL 500 MG TABLET
500.0000 mg | ORAL_TABLET | Freq: Four times a day (QID) | ORAL | 1 refills | Status: DC
Start: 2021-12-24 — End: 2022-02-12

## 2021-12-24 NOTE — Progress Notes (Signed)
[facility]  [address]  [place] [address]       Name: Steven Henderson MRN:  [id number]   Date: 12/24/2021 Age: 71 y.o.       Chief Complaint:    Chief Complaint   Patient presents with    Follow Up     Per pt he is here for a one month follow up, states he is feeling well. Did have labs done yesterday at the hospital. States he has his eye exam scheduled for next month.         HPI:  Steven Henderson is a 71 y.o. male who presents to the office today for follow-up.  Patient is actually doing relatively well but wishes to discuss last couple of months he is having ED issues over the last several months. Blood pressure is still elevated and wants to address.        Past Medical History:  Past Medical History:   Diagnosis Date    Anxiety state     BPH (benign prostatic hyperplasia)     Depression, acute     Diabetes mellitus, type 2 (CMS HCC)     Elevated PSA     Generalized OA     Hemochromatosis     Hyperlipidemia     Hypertension     Hypomagnesemia     Insomnia     Testicular hypofunction     Vitamin D deficiency          Past Surgical History:   Procedure Laterality Date    CYSTOSCOPY  06/23/2017    HX APPENDECTOMY        Current Outpatient Medications   Medication Sig    amLODIPine (NORVASC) 10 mg Oral Tablet Take 1 Tablet (10 mg total) by mouth Once a day    aspirin (ECOTRIN) 81 mg Oral Tablet, Delayed Release (E.C.) Take 1 Tablet (81 mg total) by mouth Once a day    cyanocobalamin (VITAMIN B12) 2,500 mcg Sublingual Tablet, Sublingual Place 1 Tablet (2,500 mcg total) under the tongue Once a day    DULoxetine (CYMBALTA DR) 30 mg Oral Capsule, Delayed Release(E.C.) Take 1 Capsule (30 mg total) by mouth Once a day for 120 days Indications: chronic muscle or bone pain    ergocalciferol, vitamin D2, (DRISDOL) 1,250 mcg (50,000 unit) Oral Capsule Take 1 Capsule (50,000 Units total) by mouth Every 7 days    finasteride (PROSCAR) 5 mg Oral Tablet Take 1 Tablet (5 mg total) by mouth  Once a day    glimepiride (AMARYL) 2 mg Oral Tablet Take 1 Tablet (2 mg total) by mouth Every morning with breakfast    hydroCHLOROthiazide (HYDRODIURIL) 25 mg Oral Tablet Take 1 Tablet (25 mg total) by mouth Once a day    meloxicam (MOBIC) 15 mg Oral Tablet Take 1 Tablet (15 mg total) by mouth Once a day    metFORMIN (GLUCOPHAGE) 500 mg Oral Tablet Take 2 Tablets (1,000 mg total) by mouth Twice daily with food    methocarbamoL (ROBAXIN) 500 mg Oral Tablet Take 1 Tablet (500 mg total) by mouth Four times a day    metoprolol succinate (TOPROL-XL) 50 mg Oral Tablet Sustained Release 24 hr Take 1 Tablet (50 mg total) by mouth Once a day    rosuvastatin (CRESTOR) 10 mg Oral Tablet Take 1 Tablet (10 mg total) by mouth Every evening    tadalafil (CIALIS) 5 mg Oral Tablet Take 1 Tablet (5 mg total) by mouth Every  24 hours as needed    tamsulosin (FLOMAX) 0.4 mg Oral Capsule Take 1 Capsule (0.4 mg total) by mouth Every evening after dinner    telmisartan (MICARDIS) 80 mg Oral Tablet Take 1 Tablet (80 mg total) by mouth Once a day     No Known Allergies    Family History:  Family Medical History:       Problem Relation (Age of Onset)    Liver Cancer Father    Stroke Mother              Social History:   Social History     Tobacco Use   Smoking Status Never   Smokeless Tobacco Never     Social History     Substance and Sexual Activity   Alcohol Use Never     Social History     Occupational History    Not on file       Review of Systems:  Review of systems as discussed in HPI    Problem List:  Patient Active Problem List   Diagnosis    Anxiety state    BPH (benign prostatic hyperplasia)    Depression, acute    Diabetes mellitus, type 2 (CMS HCC)    Elevated PSA    Generalized OA    Hemochromatosis    Hyperlipidemia    Hypertension    Hypomagnesemia    Insomnia    Major depression, recurrent (CMS HCC)    Chronic bilateral thoracic back pain       Physical Examination:  BP (!) 159/83   Pulse 81   Ht 1.727 m ('5\' 8"' )   Wt 92.2  kg (203 lb 3.2 oz)   SpO2 98%   BMI 30.90 kg/m       Physical Exam  Vitals and nursing note reviewed.   Constitutional:       General: He is not in acute distress.     Appearance: Normal appearance.   HENT:      Head: Normocephalic.      Right Ear: Tympanic membrane normal.      Left Ear: Tympanic membrane normal.      Nose: Nose normal.      Mouth/Throat:      Mouth: Mucous membranes are moist.   Eyes:      General: No scleral icterus.     Extraocular Movements: Extraocular movements intact.      Conjunctiva/sclera: Conjunctivae normal.      Pupils: Pupils are equal, round, and reactive to light.   Neck:      Vascular: No carotid bruit.   Cardiovascular:      Rate and Rhythm: Normal rate and regular rhythm.      Pulses: Normal pulses.           Dorsalis pedis pulses are 2+ on the right side and 2+ on the left side.        Posterior tibial pulses are 2+ on the right side and 2+ on the left side.      Heart sounds: Normal heart sounds.   Pulmonary:      Effort: Pulmonary effort is normal.      Breath sounds: Normal breath sounds. No wheezing, rhonchi or rales.   Abdominal:      General: Abdomen is flat. Bowel sounds are normal.      Tenderness: There is no abdominal tenderness. There is no guarding.   Musculoskeletal:         General: No swelling. Normal  range of motion.      Cervical back: Normal range of motion. No tenderness.      Right lower leg: No edema.      Left lower leg: No edema.   Skin:     General: Skin is warm.      Capillary Refill: Capillary refill takes less than 2 seconds.      Findings: Ecchymosis present.   Neurological:      General: No focal deficit present.      Mental Status: He is alert and oriented to person, place, and time.      Cranial Nerves: No cranial nerve deficit.            Health Maintenance:  Health Maintenance   Topic Date Due    Diabetic Retinal Exam  Never done    Diabetic Kidney Health Microalb/Cr Ratio  Never done    Colonoscopy  Never done    Annual Wellness Visit  Never  done    Influenza Vaccine (1) 01/16/2022    Diabetic A1C  04/16/2022    Diabetic Kidney Health eGFR  12/24/2022    Pneumococcal Vaccination, Age 48+  Completed    Meningococcal Vaccine  Aged Out    Adult Tdap-Td  Discontinued    Hepatitis C screening  Discontinued    Shingles Vaccine  Discontinued    Covid-19 Vaccine  Discontinued    Prostate Cancer Screening  Discontinued        Assessment:      ICD-10-CM    1. Diabetes mellitus, type 2 (CMS HCC)  E11.9 Labs ordered here and on this visit will be be addressed by me. I will contact patient and address therapy options once completed.       2. Hypertension associated with type 2 diabetes mellitus (CMS HCC)  E11.59 Will change to telmesartan    telmisartan (MICARDIS) 80 mg Oral Tablet    I15.2       3. DM type 2 with diabetic mixed hyperlipidemia (CMS HCC)  E11.69 Condition stable will continue current therapy.      E78.2       4. Hereditary hemochromatosis (CMS HCC)  E83.110 Clinically without symptoms or evidence of recurrent disease. Continue current treatment plan as stable.       5. Anxiety state  F41.1 methocarbamoL (ROBAXIN) 500 mg Oral Tablet      6. Elevated PSA  R97.20 Condition stable will continue current therapy.        7. 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    This note was partially created using voice recognition software and is inherently subject to errors including those of syntax and "sound alike " substitutions which may escape proof reading.  In such instances, original meaning may be extrapolated by contextual derivation.    Nettie Elm, DO  12/24/2021, 08:57

## 2022-02-04 ENCOUNTER — Encounter (INDEPENDENT_AMBULATORY_CARE_PROVIDER_SITE_OTHER): Payer: Self-pay | Admitting: Internal Medicine

## 2022-02-04 ENCOUNTER — Ambulatory Visit (INDEPENDENT_AMBULATORY_CARE_PROVIDER_SITE_OTHER): Payer: Medicare Other | Admitting: Internal Medicine

## 2022-02-04 ENCOUNTER — Other Ambulatory Visit: Payer: Self-pay

## 2022-02-04 VITALS — BP 137/77 | HR 83 | Ht 68.0 in | Wt 204.0 lb

## 2022-02-04 DIAGNOSIS — E1169 Type 2 diabetes mellitus with other specified complication: Secondary | ICD-10-CM

## 2022-02-04 DIAGNOSIS — E119 Type 2 diabetes mellitus without complications: Secondary | ICD-10-CM

## 2022-02-04 DIAGNOSIS — E1159 Type 2 diabetes mellitus with other circulatory complications: Secondary | ICD-10-CM

## 2022-02-04 DIAGNOSIS — F32A Depression, unspecified: Secondary | ICD-10-CM

## 2022-02-04 DIAGNOSIS — F331 Major depressive disorder, recurrent, moderate: Secondary | ICD-10-CM

## 2022-02-04 DIAGNOSIS — N401 Enlarged prostate with lower urinary tract symptoms: Secondary | ICD-10-CM

## 2022-02-04 DIAGNOSIS — R5383 Other fatigue: Secondary | ICD-10-CM | POA: Insufficient documentation

## 2022-02-04 DIAGNOSIS — N521 Erectile dysfunction due to diseases classified elsewhere: Secondary | ICD-10-CM

## 2022-02-04 DIAGNOSIS — R338 Other retention of urine: Secondary | ICD-10-CM

## 2022-02-04 DIAGNOSIS — I152 Hypertension secondary to endocrine disorders: Secondary | ICD-10-CM | POA: Insufficient documentation

## 2022-02-04 MED ORDER — TELMISARTAN 80 MG-AMLODIPINE 10 MG TABLET
1.0000 | ORAL_TABLET | Freq: Every day | ORAL | 3 refills | Status: DC
Start: 2022-02-04 — End: 2022-07-07

## 2022-02-04 MED ORDER — TADALAFIL 20 MG TABLET
20.0000 mg | ORAL_TABLET | ORAL | 2 refills | Status: DC | PRN
Start: 2022-02-04 — End: 2022-07-23

## 2022-02-04 NOTE — Addendum Note (Signed)
Addended byBelva Crome on: 02/04/2022 09:36 AM     Modules accepted: Orders

## 2022-02-04 NOTE — Progress Notes (Signed)
[facility]  [address]  [place] [address]       Name: Steven Henderson MRN:  [id number]   Date: 02/04/2022 Age: 71 y.o.       Chief Complaint:    Chief Complaint   Patient presents with    Follow Up       HPI:  Steven Henderson is a 71 y.o. male who presents to the office today via follow-up of their diabetic control. Patient is actually doing relatively well and except for minor issues as described below which I have addressed in total with the patient.    Past Medical History:  Past Medical History:   Diagnosis Date    Anxiety state     BPH (benign prostatic hyperplasia)     Depression, acute     Diabetes mellitus, type 2 (CMS HCC)     Elevated PSA     Generalized OA     Hemochromatosis     Hyperlipidemia     Hypertension     Hypomagnesemia     Insomnia     Testicular hypofunction     Vitamin D deficiency          Past Surgical History:   Procedure Laterality Date    CYSTOSCOPY  06/23/2017    HX APPENDECTOMY        Current Outpatient Medications   Medication Sig    aspirin (ECOTRIN) 81 mg Oral Tablet, Delayed Release (E.C.) Take 1 Tablet (81 mg total) by mouth Once a day    cyanocobalamin (VITAMIN B12) 2,500 mcg Sublingual Tablet, Sublingual Place 1 Tablet (2,500 mcg total) under the tongue Once a day    finasteride (PROSCAR) 5 mg Oral Tablet Take 1 Tablet (5 mg total) by mouth Once a day    glimepiride (AMARYL) 2 mg Oral Tablet Take 1 Tablet (2 mg total) by mouth Every morning with breakfast    hydroCHLOROthiazide (HYDRODIURIL) 25 mg Oral Tablet Take 1 Tablet (25 mg total) by mouth Once a day    meloxicam (MOBIC) 15 mg Oral Tablet Take 1 Tablet (15 mg total) by mouth Once a day    methocarbamoL (ROBAXIN) 500 mg Oral Tablet Take 1 Tablet (500 mg total) by mouth Four times a day    metoprolol succinate (TOPROL-XL) 50 mg Oral Tablet Sustained Release 24 hr Take 1 Tablet (50 mg total) by mouth Once a day    rosuvastatin (CRESTOR) 10 mg Oral Tablet Take 1 Tablet (10 mg total) by  mouth Every evening    tadalafil (CIALIS) 5 mg Oral Tablet Take 1 Tablet (5 mg total) by mouth Every 24 hours as needed    tamsulosin (FLOMAX) 0.4 mg Oral Capsule Take 1 Capsule (0.4 mg total) by mouth Every evening after dinner    Telmisartan-Amlodipine 80-10 mg Oral Tablet Take 1 Tablet by mouth Once a day     No Known Allergies    Family History:  Family Medical History:       Problem Relation (Age of Onset)    Liver Cancer Father    Stroke Mother              Social History:   Social History     Tobacco Use   Smoking Status Never   Smokeless Tobacco Never     Social History     Substance and Sexual Activity   Alcohol Use Never     Social History     Occupational History  Not on file       Review of Systems:  Review of systems as discussed in HPI      Problem List:  Patient Active Problem List   Diagnosis    Anxiety state    BPH (benign prostatic hyperplasia)    Depression, acute    Diabetes mellitus, type 2 (CMS HCC)    Elevated PSA    Generalized OA    Hemochromatosis    Hypomagnesemia    Insomnia    Major depression, recurrent (CMS HCC)    Chronic bilateral thoracic back pain    Hypertension 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   Constitutional:       General: He is not in acute distress.     Appearance: Normal appearance.   HENT:      Head: Normocephalic.      Right Ear: Tympanic membrane normal.      Left Ear: Tympanic membrane normal.      Nose: Nose normal.      Mouth/Throat:      Mouth: Mucous membranes are moist.   Eyes:      General: No scleral icterus.     Extraocular Movements: Extraocular movements intact.      Conjunctiva/sclera: Conjunctivae normal.      Pupils: Pupils are equal, round, and reactive to light.   Neck:      Vascular: No carotid bruit.   Cardiovascular:      Rate and Rhythm: Normal rate and  regular rhythm.      Pulses: Normal pulses.           Dorsalis pedis pulses are 2+ on the right side and 2+ on the left side.        Posterior tibial pulses are 2+ on the right side and 2+ on the left side.      Heart sounds: Normal heart sounds.   Pulmonary:      Effort: Pulmonary effort is normal.      Breath sounds: Normal breath sounds. No wheezing, rhonchi or rales.   Abdominal:      General: Abdomen is flat. Bowel sounds are normal.      Tenderness: There is no abdominal tenderness. There is no guarding.   Musculoskeletal:         General: No swelling. Normal range of motion.      Cervical back: Normal range of motion. No tenderness.      Right lower leg: No edema.      Left lower leg: No edema.   Skin:     General: Skin is warm.      Capillary Refill: Capillary refill takes less than 2 seconds.      Findings: Ecchymosis present.   Neurological:      General: No focal deficit present.      Mental Status: He is alert and oriented to person, place, and time.      Cranial Nerves: No cranial nerve deficit.          Health Maintenance:  Health Maintenance   Topic Date Due    Diabetic Kidney Health Microalb/Cr Ratio  Never done    Colonoscopy  Never done  Annual Wellness Visit  Never done    Influenza Vaccine (1) Never done    Diabetic Retinal Exam  03/06/2022 (Originally 09-03-1950)    Diabetic A1C  04/16/2022    Diabetic Kidney Health eGFR  12/24/2022    Pneumococcal Vaccination, Age 90+  Completed    Meningococcal Vaccine  Aged Out    Adult Tdap-Td  Discontinued    Depression Screening  Discontinued    Hepatitis C screening  Discontinued    Shingles Vaccine  Discontinued    Covid-19 Vaccine  Discontinued    Prostate Cancer Screening  Discontinued        Assessment/Plan:      ICD-10-CM    1. 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        2. Diabetes mellitus, type 2 (CMS HCC)  E11.9 Labs ordered here and on this visit will be be  addressed by me. I will contact patient and address therapy options once completed.     URINALYSIS WITH REFLEX MICROSCOPIC AND CULTURE IF POSITIVE     HGA1C (HEMOGLOBIN A1C WITH EST AVG GLUCOSE)     MICROALBUMIN/CREATININE RATIO, URINE, TIMED      3. Hereditary hemochromatosis (CMS HCC)  E83.110 Clinically without symptoms or evidence of recurrent disease. Continue current treatment plan as stable.       4. Benign prostatic hyperplasia with urinary retention  N40.1 Labs ordered here and on this visit will be be addressed by me. I will contact patient and address therapy options once completed.     R33.8       5. Hypertension associated with type 2 diabetes mellitus (CMS HCC)  E11.59 Blood Pressure today is stable and will continue current treatment plans     Telmisartan-Amlodipine 80-10 mg Oral Tablet    I15.2 CBC     COMPREHENSIVE METABOLIC PANEL, NON-FASTING      6. Fatigue, unspecified type  R53.83 Labs ordered here and on this visit will be be addressed by me. I will contact patient and address therapy options once completed.     THYROID STIMULATING HORMONE (SENSITIVE TSH)      7. Depression, acute  F32.A Condition stable will continue current therapy.           Orders Placed This Encounter    CBC    COMPREHENSIVE METABOLIC PANEL, NON-FASTING    THYROID STIMULATING HORMONE (SENSITIVE TSH)    URINALYSIS WITH REFLEX MICROSCOPIC AND CULTURE IF POSITIVE    HGA1C (HEMOGLOBIN A1C WITH EST AVG GLUCOSE)    MICROALBUMIN/CREATININE RATIO, URINE, TIMED    Telmisartan-Amlodipine 80-10 mg Oral Tablet       Follow up:  Return in about 3 months (around 05/06/2022).    This note was partially created using voice recognition software and is inherently subject to errors including those of syntax and "sound alike " substitutions which may escape proof reading.  In such instances, original meaning may be extrapolated by contextual derivation.    Nettie Elm, DO  02/04/2022, 09:18

## 2022-02-04 NOTE — Nursing Note (Signed)
Patient in office today for 6 week follow for blood pressure check. Patient stated he hasn't been dizzy at all. Feeling good.

## 2022-02-11 ENCOUNTER — Other Ambulatory Visit: Payer: Self-pay

## 2022-02-11 ENCOUNTER — Other Ambulatory Visit: Payer: Medicare Other | Attending: Internal Medicine

## 2022-02-11 DIAGNOSIS — E1159 Type 2 diabetes mellitus with other circulatory complications: Secondary | ICD-10-CM | POA: Insufficient documentation

## 2022-02-11 DIAGNOSIS — I152 Hypertension secondary to endocrine disorders: Secondary | ICD-10-CM | POA: Insufficient documentation

## 2022-02-11 DIAGNOSIS — E119 Type 2 diabetes mellitus without complications: Secondary | ICD-10-CM | POA: Insufficient documentation

## 2022-02-11 DIAGNOSIS — R5383 Other fatigue: Secondary | ICD-10-CM | POA: Insufficient documentation

## 2022-02-11 LAB — COMPREHENSIVE METABOLIC PANEL, NON-FASTING
ALBUMIN/GLOBULIN RATIO: 1.4 (ref 0.8–1.4)
ALBUMIN: 4.3 g/dL (ref 3.5–5.7)
ALKALINE PHOSPHATASE: 47 U/L (ref 34–104)
ALT (SGPT): 32 U/L (ref 7–52)
ANION GAP: 7 mmol/L (ref 4–13)
AST (SGOT): 27 U/L (ref 13–39)
BILIRUBIN TOTAL: 0.6 mg/dL (ref 0.3–1.2)
BUN/CREA RATIO: 21 (ref 6–22)
BUN: 22 mg/dL (ref 7–25)
CALCIUM, CORRECTED: 9.7 mg/dL (ref 8.9–10.8)
CALCIUM: 10 mg/dL (ref 8.6–10.3)
CHLORIDE: 103 mmol/L (ref 98–107)
CO2 TOTAL: 27 mmol/L (ref 21–31)
CREATININE: 1.05 mg/dL (ref 0.60–1.30)
ESTIMATED GFR: 76 mL/min/{1.73_m2} (ref >59)
GLOBULIN: 3.1 (ref 2.9–5.4)
GLUCOSE: 247 mg/dL — ABNORMAL HIGH (ref 74–109)
OSMOLALITY, CALCULATED: 286 mOsm/kg (ref 270–290)
POTASSIUM: 4.2 mmol/L (ref 3.5–5.1)
PROTEIN TOTAL: 7.4 g/dL (ref 6.4–8.9)
SODIUM: 137 mmol/L (ref 136–145)

## 2022-02-11 LAB — URINALYSIS, MACRO/MICRO
BILIRUBIN: NEGATIVE mg/dL
BLOOD: NEGATIVE mg/dL
GLUCOSE: 1000 mg/dL — AB
KETONES: NEGATIVE mg/dL
LEUKOCYTES: NEGATIVE WBCs/uL
NITRITE: NEGATIVE
PH: 5.5 (ref 5.0–9.0)
PROTEIN: NEGATIVE mg/dL
SPECIFIC GRAVITY: 1.022 (ref 1.002–1.030)
UROBILINOGEN: NORMAL mg/dL

## 2022-02-11 LAB — CBC
HCT: 44.1 % (ref 36.7–47.1)
HGB: 15.5 g/dL (ref 12.5–16.3)
MCH: 31.9 pg (ref 23.8–33.4)
MCHC: 35.1 g/dL (ref 32.5–36.3)
MCV: 90.8 fL (ref 73.0–96.2)
MPV: 9.5 fL (ref 7.4–11.4)
PLATELETS: 152 10*3/uL (ref 140–440)
RBC: 4.85 10*6/uL (ref 4.06–5.63)
RDW: 13.5 % (ref 12.1–16.2)
WBC: 5.3 10*3/uL (ref 3.6–10.2)

## 2022-02-11 LAB — URINALYSIS, MICROSCOPIC
BACTERIA: NEGATIVE /hpf
WBCS: <1 /hpf (ref <6)

## 2022-02-11 LAB — THYROID STIMULATING HORMONE (SENSITIVE TSH): TSH: 2.305 u[IU]/mL (ref 0.450–5.330)

## 2022-02-11 LAB — HGA1C (HEMOGLOBIN A1C WITH EST AVG GLUCOSE): HEMOGLOBIN A1C: 7.7 % — ABNORMAL HIGH (ref 4.0–6.0)

## 2022-02-12 ENCOUNTER — Other Ambulatory Visit (INDEPENDENT_AMBULATORY_CARE_PROVIDER_SITE_OTHER): Payer: Self-pay | Admitting: Internal Medicine

## 2022-02-12 ENCOUNTER — Telehealth (INDEPENDENT_AMBULATORY_CARE_PROVIDER_SITE_OTHER): Payer: Self-pay | Admitting: Internal Medicine

## 2022-02-12 ENCOUNTER — Other Ambulatory Visit: Payer: Medicare Other | Attending: Internal Medicine

## 2022-02-12 DIAGNOSIS — F411 Generalized anxiety disorder: Secondary | ICD-10-CM

## 2022-02-12 DIAGNOSIS — E119 Type 2 diabetes mellitus without complications: Secondary | ICD-10-CM | POA: Insufficient documentation

## 2022-02-12 MED ORDER — METHOCARBAMOL 500 MG TABLET
500.0000 mg | ORAL_TABLET | Freq: Four times a day (QID) | ORAL | 1 refills | Status: DC
Start: 2022-02-12 — End: 2022-03-23

## 2022-02-12 MED ORDER — GLIMEPIRIDE 2 MG TABLET
2.0000 mg | ORAL_TABLET | Freq: Every morning | ORAL | 4 refills | Status: DC
Start: 2022-02-12 — End: 2022-05-26

## 2022-02-12 NOTE — Telephone Encounter (Signed)
Ok

## 2022-02-12 NOTE — Telephone Encounter (Signed)
Patients wife called the new med to replace Micardis & Norbasc was to expensive so they want me to let you know that he is gonna keep taking to two instead of the new one.  Thanks [employer]

## 2022-02-13 LAB — MICROALBUMIN/CREATININE RATIO, URINE, TIMED
CREATININE TIMED URINE: 109 mg/dL
CREATININE/SPECIMEN: 1608 mg/d (ref 800–2100)
MICROALBUMIN TIMED URINE: 0.8 mg/dL
MICROALBUMIN/CREATININE RATIO TIMED URINE: 7.3 mg/g (ref <30.0)
MICROALBUMIN/SPECIMEN: 11.8 mg/d (ref <30.0)
URINE COLLECTION DURATION: 24 h
URINE VOLUME: 1475 mL

## 2022-02-16 ENCOUNTER — Telehealth (INDEPENDENT_AMBULATORY_CARE_PROVIDER_SITE_OTHER): Payer: Self-pay | Admitting: Internal Medicine

## 2022-02-16 NOTE — Telephone Encounter (Signed)
-----   Message from Nettie Elm, DO sent at 02/11/2022  1:28 PM EDT -----  Let patient know that I am aware that we decreased his Mounjaro 2 5 mg because of his weight but his A1c is increased to 7.7.  His 2 options would be to try to avoid carbohydrates as much as he can with restriction and we will recheck his labs on his next visit or we can increase his Amaryl to 4 mg daily.

## 2022-02-16 NOTE — Telephone Encounter (Signed)
Spoke with wife and advised of instructions from Dr. Tamala Julian.  The patient states that she will relay the message to the patient and if there are any questions, she will have the patient contact the office.  The patient's wife verbalized understanding of instructions via verbal read back.

## 2022-03-05 ENCOUNTER — Encounter (HOSPITAL_COMMUNITY): Payer: Self-pay | Admitting: Orthopaedic Surgery

## 2022-03-05 ENCOUNTER — Ambulatory Visit (HOSPITAL_COMMUNITY): Payer: Medicare Other | Admitting: Anesthesiology

## 2022-03-05 ENCOUNTER — Inpatient Hospital Stay
Admission: RE | Admit: 2022-03-05 | Discharge: 2022-03-05 | Disposition: A | Discharge status: Home / Self Care | Payer: Medicare Other | Source: Ambulatory Visit | Attending: Orthopaedic Surgery | Admitting: Orthopaedic Surgery

## 2022-03-05 ENCOUNTER — Other Ambulatory Visit: Payer: Self-pay

## 2022-03-05 ENCOUNTER — Encounter (HOSPITAL_COMMUNITY): Payer: Medicare Other | Admitting: Orthopaedic Surgery

## 2022-03-05 ENCOUNTER — Encounter (HOSPITAL_COMMUNITY)
Admission: RE | Disposition: A | Discharge status: Home / Self Care | Payer: Self-pay | Source: Ambulatory Visit | Attending: Orthopaedic Surgery

## 2022-03-05 DIAGNOSIS — G5601 Carpal tunnel syndrome, right upper limb: Secondary | ICD-10-CM | POA: Insufficient documentation

## 2022-03-05 LAB — POC BLOOD GLUCOSE (RESULTS): GLUCOSE, POC: 202 mg/dl (ref 50–500)

## 2022-03-05 SURGERY — DECOMPRESSION NERVE MEDIAN REPAIR
Anesthesia: Bier Block | Site: Hand | Laterality: Right | Wound class: Clean Wound: Uninfected operative wounds in which no inflammation occurred

## 2022-03-05 MED ORDER — FENTANYL (PF) 50 MCG/ML INJECTION WRAPPER
INJECTION | Freq: Once | INTRAMUSCULAR | Status: DC | PRN
Start: 2022-03-05 — End: 2022-03-05
  Administered 2022-03-05 (×2): 25 ug via INTRAVENOUS

## 2022-03-05 MED ORDER — ONDANSETRON HCL (PF) 4 MG/2 ML INJECTION SOLUTION
4.0000 mg | Freq: Once | INTRAMUSCULAR | Status: DC
Start: 2022-03-05 — End: 2022-03-05

## 2022-03-05 MED ORDER — FAMOTIDINE (PF) 20 MG/2 ML INTRAVENOUS SOLUTION
20.0000 mg | Freq: Once | INTRAVENOUS | Status: DC
Start: 2022-03-05 — End: 2022-03-05

## 2022-03-05 MED ORDER — LIDOCAINE (PF) 5 MG/ML (0.5 %) INJECTION SOLUTION
Freq: Once | INTRAMUSCULAR | Status: DC | PRN
Start: 2022-03-05 — End: 2022-03-05
  Administered 2022-03-05: 50 mL via EPIDURAL

## 2022-03-05 MED ORDER — FENTANYL (PF) 50 MCG/ML INJECTION WRAPPER
50.0000 ug | INJECTION | INTRAMUSCULAR | Status: DC | PRN
Start: 2022-03-05 — End: 2022-03-05

## 2022-03-05 MED ORDER — FAMOTIDINE (PF) 20 MG/2 ML INTRAVENOUS SOLUTION
INTRAVENOUS | Status: AC
Start: 2022-03-05 — End: 2022-03-05
  Filled 2022-03-05: qty 2

## 2022-03-05 MED ORDER — ONDANSETRON HCL (PF) 4 MG/2 ML INJECTION SOLUTION
INTRAMUSCULAR | Status: AC
Start: 2022-03-05 — End: 2022-03-05
  Filled 2022-03-05: qty 2

## 2022-03-05 MED ORDER — IPRATROPIUM 0.5 MG-ALBUTEROL 3 MG (2.5 MG BASE)/3 ML NEBULIZATION SOLN
3.0000 mL | INHALATION_SOLUTION | Freq: Once | RESPIRATORY_TRACT | Status: DC | PRN
Start: 2022-03-05 — End: 2022-03-05

## 2022-03-05 MED ORDER — PROPOFOL 10 MG/ML IV BOLUS
INJECTION | Freq: Once | INTRAVENOUS | Status: DC | PRN
Start: 2022-03-05 — End: 2022-03-05
  Administered 2022-03-05 (×7): 20 mg via INTRAVENOUS

## 2022-03-05 MED ORDER — ONDANSETRON HCL (PF) 4 MG/2 ML INJECTION SOLUTION
4.0000 mg | Freq: Once | INTRAMUSCULAR | Status: DC | PRN
Start: 2022-03-05 — End: 2022-03-05

## 2022-03-05 MED ORDER — FENTANYL (PF) 50 MCG/ML INJECTION WRAPPER
25.0000 ug | INJECTION | INTRAMUSCULAR | Status: DC | PRN
Start: 2022-03-05 — End: 2022-03-05

## 2022-03-05 MED ORDER — LACTATED RINGERS INTRAVENOUS SOLUTION
INTRAVENOUS | Status: DC
Start: 2022-03-05 — End: 2022-03-05

## 2022-03-05 MED ORDER — PROCHLORPERAZINE EDISYLATE 10 MG/2 ML (5 MG/ML) INJECTION SOLUTION
5.0000 mg | Freq: Once | INTRAMUSCULAR | Status: DC | PRN
Start: 2022-03-05 — End: 2022-03-05

## 2022-03-05 MED ORDER — SODIUM CHLORIDE 0.9 % (FLUSH) INJECTION SYRINGE
3.0000 mL | INJECTION | Freq: Three times a day (TID) | INTRAMUSCULAR | Status: DC
Start: 2022-03-05 — End: 2022-03-05

## 2022-03-05 MED ORDER — OXYCODONE-ACETAMINOPHEN 5 MG-325 MG TABLET
1.0000 | ORAL_TABLET | Freq: Once | ORAL | Status: DC | PRN
Start: 2022-03-05 — End: 2022-03-05

## 2022-03-05 MED ORDER — MIDAZOLAM 5 MG/ML INJECTION WRAPPER
2.0000 mg | Freq: Once | INTRAMUSCULAR | Status: DC | PRN
Start: 2022-03-05 — End: 2022-03-05

## 2022-03-05 MED ORDER — ALBUTEROL SULFATE 2.5 MG/3 ML (0.083 %) SOLUTION FOR NEBULIZATION
2.5000 mg | INHALATION_SOLUTION | Freq: Once | RESPIRATORY_TRACT | Status: DC | PRN
Start: 2022-03-05 — End: 2022-03-05

## 2022-03-05 MED ORDER — HYDROCODONE 7.5 MG-ACETAMINOPHEN 325 MG TABLET
1.0000 | ORAL_TABLET | ORAL | 0 refills | Status: DC | PRN
Start: 2022-03-05 — End: 2022-07-08

## 2022-03-05 MED ORDER — HYDROMORPHONE 2 MG/ML INJECTION WRAPPER
1.0000 mg | INJECTION | Freq: Once | INTRAMUSCULAR | Status: DC | PRN
Start: 2022-03-05 — End: 2022-03-05

## 2022-03-05 MED ORDER — DEXAMETHASONE SODIUM PHOSPHATE 4 MG/ML INJECTION SOLUTION
INTRAMUSCULAR | Status: AC
Start: 2022-03-05 — End: 2022-03-05
  Filled 2022-03-05: qty 1

## 2022-03-05 MED ORDER — DEXAMETHASONE SODIUM PHOSPHATE 4 MG/ML INJECTION SOLUTION
4.0000 mg | Freq: Once | INTRAMUSCULAR | Status: DC
Start: 2022-03-05 — End: 2022-03-05

## 2022-03-05 MED ORDER — SODIUM CHLORIDE 0.9 % (FLUSH) INJECTION SYRINGE
3.0000 mL | INJECTION | INTRAMUSCULAR | Status: DC | PRN
Start: 2022-03-05 — End: 2022-03-05

## 2022-03-05 MED ORDER — FENTANYL (PF) 50 MCG/ML INJECTION SOLUTION
INTRAMUSCULAR | Status: AC
Start: 2022-03-05 — End: 2022-03-05
  Filled 2022-03-05: qty 2

## 2022-03-05 SURGICAL SUPPLY — 38 items
BANDAGE 3X75 CONFORM STRL MEDC (WOUND CARE/ENTEROSTOMAL SUPPLY) ×1
BANDAGE MDCHC 75X3IN STRL STRCH GAUZE NON ACUTE COMPRESS LTX DISP (WOUND CARE SUPPLY) ×1 IMPLANT
BLADE 15 2 END CBNSTL SURG STRL DISP (SURGICAL CUTTING SUPPLIES) ×2 IMPLANT
CLEANER INSTR PREPZYME MUL-TRD CONTAINR NARSL NEUT PH BDGR (MISCELLANEOUS PT CARE ITEMS) ×1
CONV USE ITEM 321837 - GLOVE SURG 7.5 LTX PF NONST CRM (GLOVES AND ACCESSORIES) ×1 IMPLANT
CONV USE ITEM 329146 - CLEANER INSTR PREPZYME MUL-TRD CONTAINR NARSL NEUT PH BDGR 22OZ (MISCELLANEOUS PT CARE ITEMS) ×1 IMPLANT
COUNTER 20 CNT BLOCK ADH NEEDLE STRL LF  RD SHARP FOAM 15.75X11.5X14IN DISP (MED SURG SUPPLIES) ×1 IMPLANT
COUNTER 20 CNT BLOCK ADH NEEDLE STRL LF RD SHARP FOAM 15.75 (MED SURG SUPPLIES) ×1
COVER TBL 90X50IN STD SMS REINF FNFLD STRL LF  DISP (DRAPE/PACKS/SHEETS/OR TOWEL) ×1 IMPLANT
COVER TBL 90X50IN STD SMS REINF FNFLD STRL LF DISP (DRAPE/PACKS/SHEETS/OR TOWEL) ×1
DRAPE ABS REINF ELAS FEN CNTCT CLSR 146X110X75IN HAND PRXM LF  STRL DISP SURG SMS 42X18IN (DRAPE/PACKS/SHEETS/OR TOWEL) ×1 IMPLANT
DRAPE ABS REINF ELAS FEN CNTCT_CLSR 146X110X75IN HAND PRXM (DRAPE/PACKS/SHEETS/OR TOWEL) ×1
DRAPE FNFLD ABS REINF 77X53IN 43528 PRXM LF  STRL DISP SURG SMS 44X23IN (DRAPE/PACKS/SHEETS/OR TOWEL) ×1 IMPLANT
DRAPE FNFLD ABS REINF 77X53IN_43528 PRXM LF STRL DISP SURG (DRAPE/PACKS/SHEETS/OR TOWEL) ×1
DRESS PETRO 9X5IN CURAD XR COTTON NONADH OCL IMPREGNATE LF  STRL WHT (WOUND CARE SUPPLY) ×1 IMPLANT
DRESS PETRO 9X5IN CURAD XR COT_TON NONADH OCL IMPREGNATE LF (WOUND CARE/ENTEROSTOMAL SUPPLY) ×1
DURAPREP 26ML 8630 CS/20 (MED SURG SUPPLIES) ×1
GLOVE SURG 7.5 LF  PF SMOOTH BEAD CUF STRL GRN 12IN SENSICARE PI GRN PLISPRN PLMR ALOE THK7.9 MIL (GLOVES AND ACCESSORIES) ×1 IMPLANT
GLOVE SURG 7.5 LF PF SMOOTH BEAD CUF STRL GRN 12IN (GLOVES AND ACCESSORIES) ×1
GLOVE SURG 7.5 LTX PF SMOOTH STRL CRM (GLOVES AND ACCESSORIES) ×1
GOWN SURG LRG STD LGTH REG L3 NONREINFORCE BRTHBL TWL STRL (DRAPE/PACKS/SHEETS/OR TOWEL) ×1
GOWN SURG LRG STD LGTH REG L3 NONREINFORCE BRTHBL TWL STRL LF  DISP BLU HALYARD SPECTRUM SMS (DRAPE/PACKS/SHEETS/OR TOWEL) ×1 IMPLANT
GOWN SURG XL STD LGTH L3 NONREINFORCE HKLP CLSR TWL STRL LF (DRAPE/PACKS/SHEETS/OR TOWEL) ×1
GOWN SURG XL STD LGTH L3 NONREINFORCE HKLP CLSR TWL STRL LF  DISP BLU SPECTRUM SMS (DRAPE/PACKS/SHEETS/OR TOWEL) ×1
GOWN SURG XL STD LGTH L3 NONREINFORCE HKLP CLSR TWL STRL LF DISP BLU SPECTRUM SMS (DRAPE/PACKS/SHEETS/OR TOWEL) ×1 IMPLANT
LABEL MED CORRECT MED LABELING SYS 4 FLG 2 SHEET 24 PRPRNT (MED SURG SUPPLIES) ×1
LABEL MED CORRECT MED LABELING SYS 4 FLG 2 SHEET 24 PRPRNT STRL (MED SURG SUPPLIES) ×1 IMPLANT
SOL IRRG 0.9% NACL 1000ML PLASTIC PR BTL ISTNC N-PYRG STRL LF (MEDICATIONS/SOLUTIONS) IMPLANT
SOL SURG PREP 26ML DRPRP 74% ISPRP 0.7% IOD POVACRYLEX SLF CNTN APPL SKIN STRL PREOP (MED SURG SUPPLIES) ×1 IMPLANT
SOLUTION IRRG NS 2F7124 1000CC_12/CS (MEDICATIONS/SOLUTIONS)
SPONGE GAUZE 4X4IN MDCHC COTTON 12 PLY TY 7 LF  STRL DISP (WOUND CARE SUPPLY) ×2 IMPLANT
SPONGE GAUZE 4X4IN MDCHC COTTO_N 12 PLY TY 7 LF STRL DISP (WOUND CARE/ENTEROSTOMAL SUPPLY) ×2
STKNT ORTHO 48X4IN COTTON PLSTR 1 PLY PCUT SEWN END LF  TUB STRL OFF WHT (ORTHOPEDICS (NOT IMPLANTS)) ×1 IMPLANT
STKNT ORTHO 48X4IN COTTON PLSTR 1 PLY PCUT SEWN END LF TUB (ORTHOPEDICS (NOT IMPLANTS)) ×1
SUTURE 4-0 C-14 SURGIPRO2 18IN BLU MONOF NONAB (SUTURE/WOUND CLOSURE) ×1 IMPLANT
TOWEL 24X16IN COTTON BLU DISP SURG STRL LF (DRAPE/PACKS/SHEETS/OR TOWEL) ×1 IMPLANT
WOUND IRRG IRRISEPT DBRD CLNSG 0.05% CHG SYSTEM STRL LF (WOUND CARE SUPPLY) ×1 IMPLANT
WOUND IRRG IRRISEPT DBRD CLNSG_0.05% CHG SYSTEM STRL LF (WOUND CARE/ENTEROSTOMAL SUPPLY) ×1

## 2022-03-05 NOTE — Anesthesia Preprocedure Evaluation (Signed)
ANESTHESIA PRE-OP EVALUATION  Planned Procedure: DECOMPRESSION NEUROLYSIS MEDIAN NERVE RIGHT WRIST AND PALM (Right: Hand)  Review of Systems     anesthesia history negative     patient summary reviewed  nursing notes reviewed        Pulmonary  negative pulmonary ROS,    Cardiovascular    Hypertension, well controlled and hyperlipidemia ,No peripheral edema,  Exercise Tolerance: > or = 4 METS        GI/Hepatic/Renal   negative GI/hepatic/renal ROS,         Endo/Other   neg endo/other ROS,       Neuro/Psych/MS   negative neuro/psych ROS,      Cancer    negative hematology/oncology ROS,               Physical Assessment      Airway       Mallampati: III    TM distance: >3 FB    Neck ROM: full  Mouth Opening: good.            Dental       Dentition intact             Pulmonary    Breath sounds clear to auscultation  (-) no rhonchi, no decreased breath sounds, no wheezes, no rales and no stridor     Cardiovascular    Rhythm: regular  Rate: Normal  (-) no friction rub, carotid bruit is not present, no peripheral edema and no murmur     Other findings          Plan  ASA 2     Planned anesthesia type: regional block                           Anesthesia issues/risks discussed are: Local Anesthetic Systemic Toxicity and Failure of Block.  Anesthetic plan and risks discussed with patient  signed consent obtained            Patient's NPO status is appropriate for Anesthesia.           Plan discussed with CRNA.    (IV block)

## 2022-03-05 NOTE — Anesthesia Transfer of Care (Signed)
ANESTHESIA TRANSFER OF CARE   Steven Henderson is a 71 y.o. ,male, Weight: 92.5 kg (204 lb)   had Procedure(s):  DECOMPRESSION NEUROLYSIS MEDIAN NERVE RIGHT WRIST AND PALM  performed  03/05/22   Primary Service: Marcha Dutton, DO    Past Medical History:   Diagnosis Date    Anxiety state     BPH (benign prostatic hyperplasia)     Depression, acute     Diabetes mellitus, type 2 (CMS HCC)     Elevated PSA     Generalized OA     Hemochromatosis     Hyperlipidemia     Hypertension     Hypomagnesemia     Insomnia     Testicular hypofunction     Vitamin D deficiency       Allergy History as of 03/05/22        No Known Allergies                  I completed my transfer of care / handoff to the receiving personnel during which we discussed:  Access, Airway, All key/critical aspects of case discussed, Analgesia, Antibiotics, Expectation of post procedure, Fluids/Product, Gave opportunity for questions and acknowledgement of understanding, Labs and PMHx      Post Location: PACU                                                             Last OR Temp: Temperature: 36.6 C (97.9 F)  ABG:  POTASSIUM   Date Value Ref Range Status   02/11/2022 4.2 3.5 - 5.1 mmol/L Final     KETONES   Date Value Ref Range Status   02/11/2022 Negative Negative, Trace mg/dL Final     CALCIUM   Date Value Ref Range Status   02/11/2022 10.0 8.6 - 10.3 mg/dL Final     Airway:* No LDAs found *  Blood pressure 136/81, pulse 66, temperature 36.6 C (97.9 F), resp. rate 18, height 1.727 m (5\' 8" ), weight 92.5 kg (204 lb), SpO2 97%.

## 2022-03-05 NOTE — H&P (Signed)
Paper H and P on chart, will be scanned to EMR.

## 2022-03-05 NOTE — Anesthesia Postprocedure Evaluation (Signed)
Anesthesia Post Op Evaluation    Patient: Steven Henderson  Procedure(s):  DECOMPRESSION NEUROLYSIS MEDIAN NERVE RIGHT WRIST AND PALM    Last Vitals:Temperature: 36.2 C (97.2 F) (03/05/22 1333)  Heart Rate: 68 (03/05/22 1333)  BP (Non-Invasive): 139/83 (03/05/22 1333)  Respiratory Rate: 20 (03/05/22 1333)  SpO2: 95 % (03/05/22 1333)    No notable events documented.    Patient is sufficiently recovered from the effects of anesthesia to participate in the evaluation and has returned to their pre-procedure level.  Patient location during evaluation: PACU       Patient participation: complete - patient participated  Level of consciousness: awake and alert and responsive to verbal stimuli    Pain management: adequate  Airway patency: patent    Anesthetic complications: no  Cardiovascular status: acceptable  Respiratory status: acceptable  Hydration status: acceptable  Patient post-procedure temperature: Pt Normothermic   PONV Status: Absent

## 2022-03-05 NOTE — Discharge Instructions (Signed)
FOLLOW UP APT. ON NOVEMBER 2, AT 11:05 AT [place] dressing on for 2 days and then change to a smaller dressing.   Keep the incision clean, dry, and covered until you are seen at your follow up appointment.    Do not put any ointments, lineament, lotions on incisions.,    Elevate the operative hand for pain and swelling.    Call for problems, questions, concerns.    Resume home meds.    Rx for home sent in to your pharmacy.

## 2022-03-05 NOTE — OR Surgeon (Signed)
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  IV regional anesthesia given.  Incision was placed along the ulnar border of the palmaris longus and deepened to the volar transverse carpal ligament.  The ligament was divided under direct vision extending proximal to the transverse ligament and distal to the motor branch.  The nerve was gently dissected to insure complete release.  The incision was closed with interrupted sutures and sterile dressings applied.  The patient was discharged to post op anesthesia recovery.  Marcha Dutton, DO  03/05/2022, 13:05   This note was partially generated using MModal Fluency Direct system, and there may be some incorrect words, spellings, and punctuation that were not noted in checking the note before saving.

## 2022-03-05 NOTE — Interval H&P Note (Signed)
H & P updated the day of the procedure.  1.  H&P completed within 30 days of surgical procedure and has been reviewed within 24 hours of admission but prior to surgery or a procedure requiring anesthesia services, the patient has been examined, and no change has occured in the patients condition since the H&P was completed.       Change in medications: No        No LMP recorded.      Comments:     2.  Patient continues to be appropriate candidate for planned surgical procedure. YES    Berlin Mokry, DO

## 2022-03-05 NOTE — OR PostOp (Signed)
1059-one touch performed at this time, results, 202.

## 2022-03-21 ENCOUNTER — Other Ambulatory Visit (INDEPENDENT_AMBULATORY_CARE_PROVIDER_SITE_OTHER): Payer: Self-pay | Admitting: Internal Medicine

## 2022-03-21 DIAGNOSIS — F411 Generalized anxiety disorder: Secondary | ICD-10-CM

## 2022-04-08 ENCOUNTER — Other Ambulatory Visit (INDEPENDENT_AMBULATORY_CARE_PROVIDER_SITE_OTHER): Payer: Self-pay | Admitting: MEDICAL ONCOLOGY

## 2022-04-13 ENCOUNTER — Telehealth (INDEPENDENT_AMBULATORY_CARE_PROVIDER_SITE_OTHER): Payer: Self-pay | Admitting: MEDICAL ONCOLOGY

## 2022-04-13 NOTE — Telephone Encounter (Signed)
Left message reminding patient of appt on 12-8 9 am with labs prior to this appt.

## 2022-04-16 ENCOUNTER — Other Ambulatory Visit: Payer: Self-pay

## 2022-04-16 ENCOUNTER — Other Ambulatory Visit: Payer: Medicare Other | Attending: MEDICAL ONCOLOGY

## 2022-04-16 LAB — CBC WITH DIFF
BASOPHIL #: 0.1 10*3/uL (ref 0.00–0.10)
BASOPHIL %: 1 % (ref 0–1)
EOSINOPHIL #: 0.1 10*3/uL (ref 0.00–0.50)
EOSINOPHIL %: 1 %
HCT: 43.6 % (ref 36.7–47.1)
HGB: 15.1 g/dL (ref 12.5–16.3)
LYMPHOCYTE #: 1.5 10*3/uL (ref 1.00–3.00)
LYMPHOCYTE %: 22 % (ref 16–44)
MCH: 31.5 pg (ref 23.8–33.4)
MCHC: 34.7 g/dL (ref 32.5–36.3)
MCV: 90.9 fL (ref 73.0–96.2)
MONOCYTE #: 0.6 10*3/uL (ref 0.30–1.00)
MONOCYTE %: 9 % (ref 5–13)
MPV: 9.9 fL (ref 7.4–11.4)
NEUTROPHIL #: 4.6 10*3/uL (ref 1.85–7.80)
NEUTROPHIL %: 67 % (ref 43–77)
PLATELETS: 149 10*3/uL (ref 140–440)
RBC: 4.8 10*6/uL (ref 4.06–5.63)
RDW: 12.9 % (ref 12.1–16.2)
WBC: 6.9 10*3/uL (ref 3.6–10.2)

## 2022-04-16 LAB — IRON TRANSFERRIN AND TIBC
IRON (TRANSFERRIN) SATURATION: 55 % — ABNORMAL HIGH (ref 20–50)
IRON: 184 ug/dL (ref 50–212)
TOTAL IRON BINDING CAPACITY: 336 ug/dL (ref 250–450)
TRANSFERRIN: 240 mg/dL (ref 203–362)
UIBC: 152 ug/dL (ref 130–375)

## 2022-04-16 LAB — COMPREHENSIVE METABOLIC PANEL, NON-FASTING
ALBUMIN/GLOBULIN RATIO: 1.4 (ref 0.8–1.4)
ALBUMIN: 4.4 g/dL (ref 3.5–5.7)
ALKALINE PHOSPHATASE: 49 U/L (ref 34–104)
ALT (SGPT): 40 U/L (ref 7–52)
ANION GAP: 9 mmol/L (ref 4–13)
AST (SGOT): 25 U/L (ref 13–39)
BILIRUBIN TOTAL: 0.8 mg/dL (ref 0.3–1.2)
BUN/CREA RATIO: 21 (ref 6–22)
BUN: 24 mg/dL (ref 7–25)
CALCIUM, CORRECTED: 10 mg/dL (ref 8.9–10.8)
CALCIUM: 10.3 mg/dL (ref 8.6–10.3)
CHLORIDE: 101 mmol/L (ref 98–107)
CO2 TOTAL: 24 mmol/L (ref 21–31)
CREATININE: 1.17 mg/dL (ref 0.60–1.30)
ESTIMATED GFR: 67 mL/min/{1.73_m2} (ref >59)
GLOBULIN: 3.1 (ref 2.9–5.4)
GLUCOSE: 271 mg/dL — ABNORMAL HIGH (ref 74–109)
OSMOLALITY, CALCULATED: 282 mOsm/kg (ref 270–290)
POTASSIUM: 4.9 mmol/L (ref 3.5–5.1)
PROTEIN TOTAL: 7.5 g/dL (ref 6.4–8.9)
SODIUM: 134 mmol/L — ABNORMAL LOW (ref 136–145)

## 2022-04-16 LAB — FERRITIN: FERRITIN: 25 ng/mL (ref 11–336)

## 2022-04-24 ENCOUNTER — Encounter (INDEPENDENT_AMBULATORY_CARE_PROVIDER_SITE_OTHER): Payer: Self-pay | Admitting: MEDICAL ONCOLOGY

## 2022-04-24 ENCOUNTER — Ambulatory Visit: Payer: Medicare Other | Attending: MEDICAL ONCOLOGY | Admitting: MEDICAL ONCOLOGY

## 2022-04-24 ENCOUNTER — Other Ambulatory Visit: Payer: Self-pay

## 2022-04-24 DIAGNOSIS — R350 Frequency of micturition: Secondary | ICD-10-CM | POA: Insufficient documentation

## 2022-04-24 DIAGNOSIS — R39198 Other difficulties with micturition: Secondary | ICD-10-CM | POA: Insufficient documentation

## 2022-04-24 DIAGNOSIS — R32 Unspecified urinary incontinence: Secondary | ICD-10-CM | POA: Insufficient documentation

## 2022-04-24 DIAGNOSIS — Z800 Family history of malignant neoplasm of digestive organs: Secondary | ICD-10-CM | POA: Insufficient documentation

## 2022-04-24 NOTE — Progress Notes (Signed)
[facility]       [facility]       CANCER CENTER NOTE     Date: 04/24/2022  Name: Steven Henderson  MRN: [id number]  Referring Physician: Rogers Seeds, MD  Primary Care Provider: Lucia Gaskins, DO    REASON FOR VISIT:   No chief complaint on file.       HISTORY OF PRESENT ILLNESS:   71 y.o.male from [facility] [id number] for evaluation and management of hemochromatosis.  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 He has been undergoing phlebotomy treatments which he has been tolerating very well.      His last phlebotomy was in February 2022 which he tolerated really well.  He did not need a phlebotomy after that because of low ferritin.    States that recently his PCP checked PSA level which was elevated at 17.  He was referred to Urology Dr. Ewing Schlein who saw him and ordered pelvic MRI.  The pelvic MRI is scheduled to be done in a week.  He will follow-up with Dr. Ewing Schlein on 21st of December to talk over the results of MRI.  States that for the last year has been having problems with urinary incontinence and frequent urination at night.  He has to wake up twice a night to use the restroom.  Complains of slow urinary stream.     REVIEW OF SYSTEMS:  Pertinent review of systems as discussed in HPI.    PAST MEDICAL HISTORY:  Past Medical History:   Diagnosis Date    Anxiety state     BPH (benign prostatic hyperplasia)     Depression, acute     Diabetes mellitus, type 2 (CMS HCC)     Elevated PSA     Generalized OA     Hemochromatosis     Hyperlipidemia     Hypertension     Hypomagnesemia     Insomnia     Testicular hypofunction     Vitamin D deficiency      MEDICATIONS:  Current Outpatient Medications   Medication Sig    amLODIPine (NORVASC) 10 mg Oral Tablet TAKE (1) TABLET DAILY    aspirin (ECOTRIN) 81 mg Oral Tablet, Delayed Release (E.C.) Take 1 Tablet (81 mg total) by mouth Once a day    cyanocobalamin (VITAMIN B12) 2,500 mcg  Sublingual Tablet, Sublingual Place 1 Tablet (2,500 mcg total) under the tongue Once a day    finasteride (PROSCAR) 5 mg Oral Tablet Take 1 Tablet (5 mg total) by mouth Once a day    glimepiride (AMARYL) 2 mg Oral Tablet Take 1 Tablet (2 mg total) by mouth Every morning with breakfast    hydroCHLOROthiazide (HYDRODIURIL) 25 mg Oral Tablet Take 1 Tablet (25 mg total) by mouth Once a day    HYDROcodone-acetaminophen (NORCO) 7.5-325 mg Oral Tablet Take 1 Tablet by mouth Every 4 hours as needed for Pain    meloxicam (MOBIC) 15 mg Oral Tablet Take 1 Tablet (15 mg total) by mouth Once a day    methocarbamoL (ROBAXIN) 500 mg Oral Tablet TAKE (1) TABLET FOUR TIMES DAILY.    metoprolol succinate (TOPROL-XL) 50 mg Oral Tablet Sustained Release 24 hr Take 1 Tablet (50 mg total) by mouth Once a day    rosuvastatin (CRESTOR) 10 mg Oral Tablet Take 1 Tablet (10 mg total) by mouth Every evening (Patient taking differently: Take 1 Tablet (10 mg total) by mouth Once a day)  tadalafil (CIALIS) 20 mg Oral Tablet Take 1 Tablet (20 mg total) by mouth Every 24 hours as needed    tamsulosin (FLOMAX) 0.4 mg Oral Capsule Take 1 Capsule (0.4 mg total) by mouth Every evening after dinner    Telmisartan-Amlodipine 80-10 mg Oral Tablet Take 1 Tablet by mouth Once a day     ALLERGIES:  No Known Allergies  PAST SURGICAL HISTORY:  Past Surgical History:   Procedure Laterality Date    CYSTOSCOPY  06/23/2017    HX APPENDECTOMY       SOCIAL HISTORY:  Social History     Tobacco Use   Smoking Status Never   Smokeless Tobacco Never      E-CigaretteVaping History:  E-Cigarette/Vaping Use: Never User    Substances vaped:  Nicotine: No  CBD: No    Additional information:  Disposable: No  Refillable Tank: No     FAMILY HISTORY:  Family Medical History:       Problem Relation (Age of Onset)    Liver Cancer Father    Stroke Mother          PHYSICAL EXAMINATION:  Vitals:  There were no vitals filed for this visit.  There is no height or weight on file to  calculate BMI.    ECOG PS 0  Physical Exam  Constitutional:       Appearance: Normal appearance.   HENT:      Head: Normocephalic and atraumatic.      Nose: Nose normal.      Mouth/Throat:      Mouth: Mucous membranes are moist.      Pharynx: Oropharynx is clear.   Eyes:      Pupils: Pupils are equal, round, and reactive to light.   Cardiovascular:      Rate and Rhythm: Normal rate and regular rhythm.      Pulses: Normal pulses.      Heart sounds: Normal heart sounds.   Pulmonary:      Effort: Pulmonary effort is normal.      Breath sounds: Normal breath sounds.   Abdominal:      General: Abdomen is flat. Bowel sounds are normal.      Palpations: Abdomen is soft.   Musculoskeletal:         General: Normal range of motion.      Cervical back: Normal range of motion and neck supple.   Neurological:      General: No focal deficit present.      Mental Status: He is alert and oriented to person, place, and time.   Psychiatric:         Mood and Affect: Mood normal.        LABORATORY:   Latest Reference Range & Units 10/14/21 16:02   WBCS UNCORRECTED x10^3/uL 9.1   WBC 4.0 - 10.5 x10^3/uL 9.1   HGB 13.5 - 18.0 g/dL 67.3   HCT 41.9 - 37.9 % 45.7   PLATELET COUNT 140 - 440 x10^3/uL 183   RBC 4.20 - 6.00 x10^6/uL 5.17   MCV 78.0 - 99.0 fL 88.4   MCHC 32.0 - 36.0 g/dL 02.4   MCH 09.7 - 35.3 pg 30.7   RDW 11.6 - 14.8 % 13.9   MPV 7.4 - 10.4 fL 9.4   SODIUM 136 - 145 mmol/L 140   POTASSIUM 3.5 - 5.1 mmol/L 3.9   CHLORIDE 98 - 107 mmol/L 105   CARBON DIOXIDE 21 - 31 mmol/L 26   BUN 7 -  25 mg/dL 19   CREATININE 9.60 - 1.30 mg/dL 4.54   GLUCOSE 74 - 098 mg/dL 86   ANION GAP 10 - 20 mmol/L 9 (L)   BUN/CREAT RATIO 6 - 22  17   ESTIMATED GLOMERULAR FILTRATION RATE >59 mL/min/1.27m^2 70   CALCIUM 8.6 - 10.3 mg/dL 11.9 (H)   CHOLESTEROL <200 mg/dL 147   HDL-CHOLESTEROL 23 - 92 mg/dL 44   LDL (CALCULATED) 0 - 100 mg/dL 98   TRIGLYCERIDES <=829 mg/dL 562 (H)   VLDL (CALCULATED) 0 - 50 mg/dL 39   CHOL/HDL RATIO  4.1   TSH 0.450 - 5.330  uIU/mL 2.858   TOTAL PROTEIN 6.4 - 8.9 g/dL 7.9   ALBUMIN 3.5 - 5.7 g/dL 4.6   (L): Data is abnormally low  (H): Data is abnormally high   Latest Reference Range & Units 10/14/21 16:02   PROSTATE SPECIFIC AG <=4.00 ng/mL 1.76   VITAMIN B12 180 - 914 pg/mL 720   HEMOGLOBIN A1C 4.0 - 6.0 % 6.8 (H)   (H): Data is abnormally high  IMAGING:       ASSESSMENT:   1.  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 Last therapeutic phlebotomy 1-2 years ago, per patient. Will check again in 3 months.    PLAN:  1. Check iron panel in 3 months.  2. RTC 6 months with CBC, iron panel.       Brayton Mars, MD

## 2022-05-26 ENCOUNTER — Encounter (INDEPENDENT_AMBULATORY_CARE_PROVIDER_SITE_OTHER): Payer: Self-pay | Admitting: Internal Medicine

## 2022-05-26 ENCOUNTER — Telehealth (INDEPENDENT_AMBULATORY_CARE_PROVIDER_SITE_OTHER): Payer: Self-pay | Admitting: Internal Medicine

## 2022-05-26 ENCOUNTER — Other Ambulatory Visit (INDEPENDENT_AMBULATORY_CARE_PROVIDER_SITE_OTHER): Payer: Self-pay | Admitting: Internal Medicine

## 2022-05-26 ENCOUNTER — Ambulatory Visit (INDEPENDENT_AMBULATORY_CARE_PROVIDER_SITE_OTHER): Payer: Medicare Other | Admitting: Internal Medicine

## 2022-05-26 ENCOUNTER — Other Ambulatory Visit: Payer: Self-pay

## 2022-05-26 ENCOUNTER — Other Ambulatory Visit: Payer: Medicare Other | Attending: Internal Medicine

## 2022-05-26 DIAGNOSIS — R5382 Chronic fatigue, unspecified: Secondary | ICD-10-CM | POA: Insufficient documentation

## 2022-05-26 DIAGNOSIS — R351 Nocturia: Secondary | ICD-10-CM

## 2022-05-26 DIAGNOSIS — E1142 Type 2 diabetes mellitus with diabetic polyneuropathy: Secondary | ICD-10-CM

## 2022-05-26 DIAGNOSIS — I152 Hypertension secondary to endocrine disorders: Secondary | ICD-10-CM

## 2022-05-26 DIAGNOSIS — N401 Enlarged prostate with lower urinary tract symptoms: Secondary | ICD-10-CM

## 2022-05-26 DIAGNOSIS — E1169 Type 2 diabetes mellitus with other specified complication: Secondary | ICD-10-CM

## 2022-05-26 DIAGNOSIS — M546 Pain in thoracic spine: Secondary | ICD-10-CM

## 2022-05-26 DIAGNOSIS — M25511 Pain in right shoulder: Secondary | ICD-10-CM

## 2022-05-26 DIAGNOSIS — K219 Gastro-esophageal reflux disease without esophagitis: Secondary | ICD-10-CM

## 2022-05-26 DIAGNOSIS — F411 Generalized anxiety disorder: Secondary | ICD-10-CM

## 2022-05-26 DIAGNOSIS — N521 Erectile dysfunction due to diseases classified elsewhere: Secondary | ICD-10-CM

## 2022-05-26 DIAGNOSIS — G8929 Other chronic pain: Secondary | ICD-10-CM

## 2022-05-26 DIAGNOSIS — E1159 Type 2 diabetes mellitus with other circulatory complications: Secondary | ICD-10-CM | POA: Insufficient documentation

## 2022-05-26 DIAGNOSIS — F331 Major depressive disorder, recurrent, moderate: Secondary | ICD-10-CM

## 2022-05-26 DIAGNOSIS — M720 Palmar fascial fibromatosis [Dupuytren]: Secondary | ICD-10-CM

## 2022-05-26 LAB — URINALYSIS, MACRO/MICRO
BILIRUBIN: NEGATIVE mg/dL
BLOOD: NEGATIVE mg/dL
GLUCOSE: >1000 mg/dL — AB
KETONES: NEGATIVE mg/dL
LEUKOCYTES: NEGATIVE WBCs/uL
NITRITE: NEGATIVE
PH: 5 (ref 5.0–9.0)
PROTEIN: NEGATIVE mg/dL
SPECIFIC GRAVITY: 1.028 (ref 1.002–1.030)
UROBILINOGEN: NORMAL mg/dL

## 2022-05-26 LAB — COMPREHENSIVE METABOLIC PANEL, NON-FASTING
ALBUMIN/GLOBULIN RATIO: 1.4 (ref 0.8–1.4)
ALBUMIN: 4.2 g/dL (ref 3.5–5.7)
ALKALINE PHOSPHATASE: 48 U/L (ref 34–104)
ALT (SGPT): 38 U/L (ref 7–52)
ANION GAP: 6 mmol/L (ref 4–13)
AST (SGOT): 25 U/L (ref 13–39)
BILIRUBIN TOTAL: 0.5 mg/dL (ref 0.3–1.2)
BUN/CREA RATIO: 19 (ref 6–22)
BUN: 23 mg/dL (ref 7–25)
CALCIUM, CORRECTED: 10.3 mg/dL (ref 8.9–10.8)
CALCIUM: 10.5 mg/dL — ABNORMAL HIGH (ref 8.6–10.3)
CHLORIDE: 101 mmol/L (ref 98–107)
CO2 TOTAL: 28 mmol/L (ref 21–31)
CREATININE: 1.18 mg/dL (ref 0.60–1.30)
ESTIMATED GFR: 66 mL/min/{1.73_m2} (ref >59)
GLOBULIN: 3 (ref 2.9–5.4)
GLUCOSE: 313 mg/dL — ABNORMAL HIGH (ref 74–109)
OSMOLALITY, CALCULATED: 286 mOsm/kg (ref 270–290)
POTASSIUM: 4.4 mmol/L (ref 3.5–5.1)
PROTEIN TOTAL: 7.2 g/dL (ref 6.4–8.9)
SODIUM: 135 mmol/L — ABNORMAL LOW (ref 136–145)

## 2022-05-26 LAB — CBC
HCT: 44.3 % (ref 36.7–47.1)
HGB: 15.4 g/dL (ref 12.5–16.3)
MCH: 31.7 pg (ref 23.8–33.4)
MCHC: 34.8 g/dL (ref 32.5–36.3)
MCV: 90.9 fL (ref 73.0–96.2)
MPV: 10 fL (ref 7.4–11.4)
PLATELETS: 137 10*3/uL — ABNORMAL LOW (ref 140–440)
RBC: 4.87 10*6/uL (ref 4.06–5.63)
RDW: 12.9 % (ref 12.1–16.2)
WBC: 6.5 10*3/uL (ref 3.6–10.2)

## 2022-05-26 LAB — HGA1C (HEMOGLOBIN A1C WITH EST AVG GLUCOSE): HEMOGLOBIN A1C: 10.4 % — ABNORMAL HIGH (ref 4.0–6.0)

## 2022-05-26 LAB — MAGNESIUM: MAGNESIUM: 1.7 mg/dL — ABNORMAL LOW (ref 1.9–2.7)

## 2022-05-26 LAB — THYROID STIMULATING HORMONE (SENSITIVE TSH): TSH: 2.568 u[IU]/mL (ref 0.450–5.330)

## 2022-05-26 MED ORDER — MOUNJARO 2.5 MG/0.5 ML SUBCUTANEOUS PEN INJECTOR
0.5000 mL | PEN_INJECTOR | SUBCUTANEOUS | 1 refills | Status: DC
Start: 2022-05-26 — End: 2022-07-23

## 2022-05-26 MED ORDER — METHOCARBAMOL 500 MG TABLET
ORAL_TABLET | ORAL | 1 refills | Status: AC
Start: 2022-05-26 — End: ?

## 2022-05-26 MED ORDER — GLIMEPIRIDE 4 MG TABLET
4.0000 mg | ORAL_TABLET | Freq: Two times a day (BID) | ORAL | 1 refills | Status: DC
Start: 2022-05-26 — End: 2023-01-06

## 2022-05-26 NOTE — Progress Notes (Signed)
[facility]  [address]  [place] [address]       Name: Steven Henderson MRN:  [id number]   Date: 05/26/2022 Age: 72 y.o.       Chief Complaint:    Chief Complaint   Patient presents with    Follow Up 3 Months     For chronic disease management.    Shoulder Pain     Rt       HPI:  Steven Henderson is a 72 y.o. male who presents to the office today via follow-up of their diabetic control. Patient is actually doing relatively well and except for minor issues as described below which I have addressed in total with the patient.    Past Medical History:  Past Medical History:   Diagnosis Date    Anxiety state     BPH (benign prostatic hyperplasia)     Depression, acute     Diabetes mellitus, type 2 (CMS HCC)     Elevated PSA     Generalized OA     Hemochromatosis     Hyperlipidemia     Hypertension     Hypomagnesemia     Insomnia     Testicular hypofunction     Vitamin D deficiency          Past Surgical History:   Procedure Laterality Date    CYSTOSCOPY  06/23/2017    HX APPENDECTOMY        Current Outpatient Medications   Medication Sig    amLODIPine (NORVASC) 10 mg Oral Tablet TAKE (1) TABLET DAILY    aspirin (ECOTRIN) 81 mg Oral Tablet, Delayed Release (E.C.) Take 1 Tablet (81 mg total) by mouth Once a day    cyanocobalamin (VITAMIN B12) 2,500 mcg Sublingual Tablet, Sublingual Place 1 Tablet (2,500 mcg total) under the tongue Once a day    finasteride (PROSCAR) 5 mg Oral Tablet Take 1 Tablet (5 mg total) by mouth Once a day    glimepiride (AMARYL) 2 mg Oral Tablet Take 1 Tablet (2 mg total) by mouth Every morning with breakfast    hydroCHLOROthiazide (HYDRODIURIL) 25 mg Oral Tablet Take 1 Tablet (25 mg total) by mouth Once a day    HYDROcodone-acetaminophen (NORCO) 7.5-325 mg Oral Tablet Take 1 Tablet by mouth Every 4 hours as needed for Pain (Patient not taking: Reported on 05/26/2022)    meloxicam (MOBIC) 15 mg Oral Tablet Take 1 Tablet (15 mg total) by mouth Once a day     methocarbamoL (ROBAXIN) 500 mg Oral Tablet TAKE (1) TABLET FOUR TIMES DAILY.    metoprolol succinate (TOPROL-XL) 50 mg Oral Tablet Sustained Release 24 hr Take 1 Tablet (50 mg total) by mouth Once a day    rosuvastatin (CRESTOR) 10 mg Oral Tablet Take 1 Tablet (10 mg total) by mouth Every evening (Patient taking differently: Take 1 Tablet (10 mg total) by mouth Once a day)    tadalafil (CIALIS) 20 mg Oral Tablet Take 1 Tablet (20 mg total) by mouth Every 24 hours as needed    tamsulosin (FLOMAX) 0.4 mg Oral Capsule Take 1 Capsule (0.4 mg total) by mouth Every evening after dinner    Telmisartan-Amlodipine 80-10 mg Oral Tablet Take 1 Tablet by mouth Once a day     No Known Allergies    Family History:  Family Medical History:       Problem Relation (Age of Onset)    Liver Cancer Father    Stroke Mother  Social History:   Social History     Tobacco Use   Smoking Status Never   Smokeless Tobacco Never     Social History     Substance and Sexual Activity   Alcohol Use Not Currently     Social History     Occupational History    Not on file       Review of Systems:  Review of systems as discussed in HPI      Problem List:  Patient Active Problem List   Diagnosis    Anxiety state    BPH (benign prostatic hyperplasia)    Depression, acute    Diabetes mellitus, type 2 (CMS HCC)    Elevated PSA    Generalized OA    Hypomagnesemia    Insomnia    Major depression, recurrent (CMS HCC)    Chronic bilateral thoracic back pain    Hypertension associated with type 2 diabetes mellitus (CMS HCC)     Fatigue    Hereditary hemochromatosis (CMS HCC)    Dupuytren's contracture of both hands       Physical Examination:  BP (!) 142/86 (Site: Right, Patient Position: Sitting, Cuff Size: Adult)   Pulse 80   Resp 16   Ht 1.727 m (5\' 8" )   Wt 95.7 kg (211 lb)   SpO2 96%   BMI 32.08 kg/m       Physical Exam  Vitals and nursing note reviewed.   Constitutional:       General: He is not in acute distress.     Appearance: Normal  appearance.   HENT:      Head: Normocephalic.      Right Ear: Tympanic membrane normal.      Left Ear: Tympanic membrane normal.      Nose: Nose normal.      Mouth/Throat:      Mouth: Mucous membranes are moist.   Eyes:      General: No scleral icterus.     Extraocular Movements: Extraocular movements intact.      Conjunctiva/sclera: Conjunctivae normal.      Pupils: Pupils are equal, round, and reactive to light.   Neck:      Vascular: No carotid bruit.   Cardiovascular:      Rate and Rhythm: Normal rate and regular rhythm.      Pulses: Normal pulses.           Dorsalis pedis pulses are 2+ on the right side and 2+ on the left side.        Posterior tibial pulses are 2+ on the right side and 2+ on the left side.      Heart sounds: Normal heart sounds.   Pulmonary:      Effort: Pulmonary effort is normal.      Breath sounds: Normal breath sounds. No wheezing, rhonchi or rales.   Abdominal:      General: Abdomen is flat. Bowel sounds are normal.      Tenderness: There is no abdominal tenderness. There is no guarding.   Musculoskeletal:         General: No swelling. Normal range of motion.      Cervical back: Normal range of motion. No tenderness.      Right lower leg: No edema.      Left lower leg: No edema.   Skin:     General: Skin is warm.      Capillary Refill: Capillary refill takes less than 2 seconds.  Findings: Ecchymosis present.   Neurological:      General: No focal deficit present.      Mental Status: He is alert and oriented to person, place, and time.      Cranial Nerves: No cranial nerve deficit.            Health Maintenance:  Health Maintenance   Topic Date Due    Diabetic Retinal Exam  Never done    Colonoscopy  Never done    Medicare Annual Wellness Visit  Never done    Influenza Vaccine (1) Never done    Diabetic A1C  08/12/2022    Diabetic Kidney Health Microalb/Cr Ratio  02/14/2023    Diabetic Kidney Health eGFR  04/17/2023    Pneumococcal Vaccination, Age 26+  Completed    Meningococcal  Vaccine  Aged Out    Adult Tdap-Td  Discontinued    Depression Screening  Discontinued    Hepatitis C screening  Discontinued    Shingles Vaccine  Discontinued    Covid-19 Vaccine  Discontinued    Prostate Cancer Screening  Discontinued        Assessment/Plan:      I have reviewed the most recent labs with the patient and all questions answered to patients satisfaction.       ICD-10-CM    1. Hereditary hemochromatosis (CMS HCC)  E83.110 Condition stable will continue current therapy.        2. Anxiety state  F41.1 Condition stable will continue current therapy.      methocarbamoL (ROBAXIN) 500 mg Oral Tablet      3. 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  We have discussed whether there is need for any therapeutic changes and the patient states that they are tolerating current treatments and will just continue with current lifestyle.       4. 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  We have discussed whether there is need for any therapeutic changes and the patient states that they are tolerating current treatments and will just continue with current lifestyle.     N52.1       5. Chronic fatigue  R53.82     Labs ordered here and on this visit will be be addressed by me. I will contact patient and address therapy options once completed.     THYROID STIMULATING HORMONE (SENSITIVE TSH)      6. Benign prostatic hyperplasia with nocturia  N40.1 The patient's condition is unchanged from prior.  We have discussed whether there is need for any therapeutic changes and the patient states that they are tolerating current treatments and will just continue with current lifestyle.     R35.1       7. Hypertension associated with type 2 diabetes mellitus (CMS [place])   E11.59 Labs ordered here and on this visit will be be addressed by me. I will contact patient and  address therapy options once completed.     CBC    I15.2 COMPREHENSIVE METABOLIC PANEL, NON-FASTING     URINALYSIS WITH REFLEX MICROSCOPIC AND CULTURE IF POSITIVE      8. 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 I will contact patient and address therapy options once completed.  HGA1C (HEMOGLOBIN A1C WITH EST AVG GLUCOSE)      9. Chronic bilateral thoracic back pain  M54.6 The patient's condition is unchanged from prior.  We have discussed whether there is need for any therapeutic changes and the patient states that they are tolerating current treatments and will just continue with current lifestyle.     G89.29       10. Dupuytren's contracture of both hands  M72.0 Referral to External Provider      11. Gastroesophageal reflux disease without esophagitis  K21.9 Labs ordered here and on this visit will be be addressed by me. I will contact patient and address therapy options once completed.     MAGNESIUM      12. Shoulder pain, right  M25.511 Referral to External Provider         Orders Placed This Encounter    CBC    COMPREHENSIVE METABOLIC PANEL, NON-FASTING    THYROID STIMULATING HORMONE (SENSITIVE TSH)    URINALYSIS WITH REFLEX MICROSCOPIC AND CULTURE IF POSITIVE    MAGNESIUM    HGA1C (HEMOGLOBIN A1C WITH EST AVG GLUCOSE)    Referral to External Provider    methocarbamoL (ROBAXIN) 500 mg Oral Tablet       Follow up:  Return in about 3 months (around 08/25/2022).    This note was partially created using voice recognition software and is inherently subject to errors including those of syntax and "sound alike " substitutions which may escape proof reading.  In such instances, original meaning may be extrapolated by contextual derivation.    Lucia Gaskins, DO  05/26/2022, 08:13

## 2022-05-26 NOTE — Telephone Encounter (Signed)
Pt needs to talk with you about the insulin shot he needs to take

## 2022-05-30 ENCOUNTER — Other Ambulatory Visit (INDEPENDENT_AMBULATORY_CARE_PROVIDER_SITE_OTHER): Payer: Self-pay | Admitting: Internal Medicine

## 2022-06-09 ENCOUNTER — Other Ambulatory Visit (INDEPENDENT_AMBULATORY_CARE_PROVIDER_SITE_OTHER): Payer: Self-pay | Admitting: Internal Medicine

## 2022-06-23 ENCOUNTER — Other Ambulatory Visit: Payer: Medicare Other | Attending: Orthopaedic Surgery

## 2022-06-23 ENCOUNTER — Other Ambulatory Visit: Payer: Self-pay

## 2022-06-23 DIAGNOSIS — Z01818 Encounter for other preprocedural examination: Secondary | ICD-10-CM

## 2022-06-23 LAB — CBC WITH DIFF
BASOPHIL #: 0.1 10*3/uL (ref 0.00–0.10)
BASOPHIL %: 1 % (ref 0–1)
EOSINOPHIL #: 0.1 10*3/uL (ref 0.00–0.50)
EOSINOPHIL %: 1 %
HCT: 42.7 % (ref 36.7–47.1)
HGB: 14.9 g/dL (ref 12.5–16.3)
LYMPHOCYTE #: 1.4 10*3/uL (ref 1.00–3.00)
LYMPHOCYTE %: 15 % — ABNORMAL LOW (ref 16–44)
MCH: 31.9 pg (ref 23.8–33.4)
MCHC: 34.9 g/dL (ref 32.5–36.3)
MCV: 91.4 fL (ref 73.0–96.2)
MONOCYTE #: 0.8 10*3/uL (ref 0.30–1.00)
MONOCYTE %: 9 % (ref 5–13)
MPV: 9.6 fL (ref 7.4–11.4)
NEUTROPHIL #: 6.6 10*3/uL (ref 1.85–7.80)
NEUTROPHIL %: 74 % (ref 43–77)
PLATELETS: 157 10*3/uL (ref 140–440)
RBC: 4.67 10*6/uL (ref 4.06–5.63)
RDW: 13.3 % (ref 12.1–16.2)
WBC: 9 10*3/uL (ref 3.6–10.2)

## 2022-06-23 LAB — URINALYSIS, MACROSCOPIC
BILIRUBIN: NEGATIVE mg/dL
BLOOD: NEGATIVE mg/dL
GLUCOSE: 100 mg/dL — AB
KETONES: NEGATIVE mg/dL
LEUKOCYTES: NEGATIVE WBCs/uL
NITRITE: NEGATIVE
PH: 5.5 (ref 5.0–9.0)
PROTEIN: NEGATIVE mg/dL
SPECIFIC GRAVITY: 1.021 (ref 1.002–1.030)
UROBILINOGEN: NORMAL mg/dL

## 2022-06-23 LAB — URINALYSIS, MICROSCOPIC
RBCS: 1 /hpf (ref <4)
WBCS: <1 /hpf (ref <6)

## 2022-06-23 LAB — BASIC METABOLIC PANEL
ANION GAP: 7 mmol/L (ref 4–13)
BUN/CREA RATIO: 21 (ref 6–22)
BUN: 22 mg/dL (ref 7–25)
CALCIUM: 10.1 mg/dL (ref 8.6–10.3)
CHLORIDE: 102 mmol/L (ref 98–107)
CO2 TOTAL: 26 mmol/L (ref 21–31)
CREATININE: 1.07 mg/dL (ref 0.60–1.30)
ESTIMATED GFR: 75 mL/min/{1.73_m2} (ref >59)
GLUCOSE: 222 mg/dL — ABNORMAL HIGH (ref 74–109)
OSMOLALITY, CALCULATED: 280 mOsm/kg (ref 270–290)
POTASSIUM: 4.2 mmol/L (ref 3.5–5.1)
SODIUM: 135 mmol/L — ABNORMAL LOW (ref 136–145)

## 2022-07-08 ENCOUNTER — Inpatient Hospital Stay
Admission: RE | Admit: 2022-07-08 | Discharge: 2022-07-08 | Disposition: A | Discharge status: Home / Self Care | Payer: Medicare Other | Source: Ambulatory Visit | Attending: Orthopaedic Surgery | Admitting: Orthopaedic Surgery

## 2022-07-08 ENCOUNTER — Encounter (HOSPITAL_COMMUNITY): Payer: Self-pay | Admitting: Orthopaedic Surgery

## 2022-07-08 ENCOUNTER — Encounter (HOSPITAL_COMMUNITY): Payer: BLUE CROSS/BLUE SHIELD | Admitting: Orthopaedic Surgery

## 2022-07-08 ENCOUNTER — Other Ambulatory Visit: Payer: Self-pay

## 2022-07-08 ENCOUNTER — Ambulatory Visit (HOSPITAL_COMMUNITY): Payer: Medicare Other | Admitting: Anesthesiology

## 2022-07-08 ENCOUNTER — Encounter (HOSPITAL_COMMUNITY)
Admission: RE | Disposition: A | Discharge status: Home / Self Care | Payer: Self-pay | Source: Ambulatory Visit | Attending: Orthopaedic Surgery

## 2022-07-08 DIAGNOSIS — Z7985 Long-term (current) use of injectable non-insulin antidiabetic drugs: Secondary | ICD-10-CM | POA: Insufficient documentation

## 2022-07-08 DIAGNOSIS — E785 Hyperlipidemia, unspecified: Secondary | ICD-10-CM | POA: Insufficient documentation

## 2022-07-08 DIAGNOSIS — Z7982 Long term (current) use of aspirin: Secondary | ICD-10-CM | POA: Insufficient documentation

## 2022-07-08 DIAGNOSIS — E119 Type 2 diabetes mellitus without complications: Secondary | ICD-10-CM | POA: Insufficient documentation

## 2022-07-08 DIAGNOSIS — Z7984 Long term (current) use of oral hypoglycemic drugs: Secondary | ICD-10-CM | POA: Insufficient documentation

## 2022-07-08 DIAGNOSIS — I10 Essential (primary) hypertension: Secondary | ICD-10-CM | POA: Insufficient documentation

## 2022-07-08 DIAGNOSIS — Z6831 Body mass index (BMI) 31.0-31.9, adult: Secondary | ICD-10-CM | POA: Insufficient documentation

## 2022-07-08 DIAGNOSIS — Z79899 Other long term (current) drug therapy: Secondary | ICD-10-CM | POA: Insufficient documentation

## 2022-07-08 DIAGNOSIS — E669 Obesity, unspecified: Secondary | ICD-10-CM | POA: Insufficient documentation

## 2022-07-08 DIAGNOSIS — M65342 Trigger finger, left ring finger: Secondary | ICD-10-CM | POA: Insufficient documentation

## 2022-07-08 LAB — POC BLOOD GLUCOSE (RESULTS): GLUCOSE, POC: 140 mg/dl — ABNORMAL HIGH (ref 70–100)

## 2022-07-08 SURGERY — RELEASE TRIGGER FINGER
Anesthesia: Bier Block | Site: Finger | Laterality: Left | Wound class: Clean Wound: Uninfected operative wounds in which no inflammation occurred

## 2022-07-08 MED ORDER — HYDROCODONE 5 MG-ACETAMINOPHEN 325 MG TABLET
1.0000 | ORAL_TABLET | ORAL | 0 refills | Status: DC | PRN
Start: 2022-07-08 — End: 2022-07-24

## 2022-07-08 MED ORDER — ONDANSETRON HCL (PF) 4 MG/2 ML INJECTION SOLUTION
4.0000 mg | Freq: Once | INTRAMUSCULAR | Status: AC
Start: 2022-07-08 — End: 2022-07-08
  Administered 2022-07-08: 4 mg via INTRAVENOUS

## 2022-07-08 MED ORDER — ALBUTEROL SULFATE 2.5 MG/3 ML (0.083 %) SOLUTION FOR NEBULIZATION
2.5000 mg | INHALATION_SOLUTION | Freq: Once | RESPIRATORY_TRACT | Status: DC | PRN
Start: 2022-07-08 — End: 2022-07-08

## 2022-07-08 MED ORDER — FENTANYL (PF) 50 MCG/ML INJECTION WRAPPER
25.0000 ug | INJECTION | INTRAMUSCULAR | Status: DC | PRN
Start: 2022-07-08 — End: 2022-07-08

## 2022-07-08 MED ORDER — MIDAZOLAM 5 MG/ML INJECTION WRAPPER
Freq: Once | INTRAMUSCULAR | Status: DC | PRN
Start: 2022-07-08 — End: 2022-07-08
  Administered 2022-07-08 (×2): 2.5 mg via INTRAVENOUS

## 2022-07-08 MED ORDER — ONDANSETRON HCL (PF) 4 MG/2 ML INJECTION SOLUTION
INTRAMUSCULAR | Status: AC
Start: 2022-07-08 — End: 2022-07-08
  Filled 2022-07-08: qty 2

## 2022-07-08 MED ORDER — HYDROMORPHONE 2 MG/ML INJECTION WRAPPER
1.0000 mg | INJECTION | Freq: Once | INTRAMUSCULAR | Status: DC | PRN
Start: 2022-07-08 — End: 2022-07-08

## 2022-07-08 MED ORDER — LACTATED RINGERS INTRAVENOUS SOLUTION
INTRAVENOUS | Status: DC
Start: 2022-07-08 — End: 2022-07-08

## 2022-07-08 MED ORDER — SODIUM CHLORIDE 0.9 % (FLUSH) INJECTION SYRINGE
3.0000 mL | INJECTION | Freq: Three times a day (TID) | INTRAMUSCULAR | Status: DC
Start: 2022-07-08 — End: 2022-07-08

## 2022-07-08 MED ORDER — SODIUM CHLORIDE 0.9 % (FLUSH) INJECTION SYRINGE
3.0000 mL | INJECTION | INTRAMUSCULAR | Status: DC | PRN
Start: 2022-07-08 — End: 2022-07-08

## 2022-07-08 MED ORDER — ONDANSETRON HCL (PF) 4 MG/2 ML INJECTION SOLUTION
4.0000 mg | Freq: Once | INTRAMUSCULAR | Status: DC | PRN
Start: 2022-07-08 — End: 2022-07-08

## 2022-07-08 MED ORDER — FENTANYL (PF) 50 MCG/ML INJECTION WRAPPER
50.0000 ug | INJECTION | INTRAMUSCULAR | Status: DC | PRN
Start: 2022-07-08 — End: 2022-07-08

## 2022-07-08 MED ORDER — OXYCODONE-ACETAMINOPHEN 5 MG-325 MG TABLET
1.0000 | ORAL_TABLET | Freq: Once | ORAL | Status: DC | PRN
Start: 2022-07-08 — End: 2022-07-08
  Administered 2022-07-08: 1 via ORAL
  Filled 2022-07-08: qty 1

## 2022-07-08 MED ORDER — IPRATROPIUM 0.5 MG-ALBUTEROL 3 MG (2.5 MG BASE)/3 ML NEBULIZATION SOLN
3.0000 mL | INHALATION_SOLUTION | Freq: Once | RESPIRATORY_TRACT | Status: DC | PRN
Start: 2022-07-08 — End: 2022-07-08

## 2022-07-08 MED ORDER — DEXMEDETOMIDINE 100 MCG/ML INTRAVENOUS SOLUTION
Freq: Once | INTRAVENOUS | Status: DC | PRN
Start: 2022-07-08 — End: 2022-07-08
  Administered 2022-07-08: 20 ug via INTRAVENOUS

## 2022-07-08 MED ORDER — MIDAZOLAM 5 MG/ML INJECTION WRAPPER
2.0000 mg | Freq: Once | INTRAMUSCULAR | Status: DC | PRN
Start: 2022-07-08 — End: 2022-07-08

## 2022-07-08 MED ORDER — PROCHLORPERAZINE EDISYLATE 10 MG/2 ML (5 MG/ML) INJECTION SOLUTION
5.0000 mg | Freq: Once | INTRAMUSCULAR | Status: DC | PRN
Start: 2022-07-08 — End: 2022-07-08

## 2022-07-08 MED ORDER — MIDAZOLAM 5 MG/ML INJECTION WRAPPER
INTRAMUSCULAR | Status: AC
Start: 2022-07-08 — End: 2022-07-08
  Filled 2022-07-08: qty 1

## 2022-07-08 MED ORDER — FAMOTIDINE (PF) 20 MG/2 ML INTRAVENOUS SOLUTION
INTRAVENOUS | Status: AC
Start: 2022-07-08 — End: 2022-07-08
  Filled 2022-07-08: qty 2

## 2022-07-08 MED ORDER — FENTANYL (PF) 50 MCG/ML INJECTION SOLUTION
INTRAMUSCULAR | Status: AC
Start: 2022-07-08 — End: 2022-07-08
  Filled 2022-07-08: qty 2

## 2022-07-08 MED ORDER — LIDOCAINE (PF) 5 MG/ML (0.5 %) INJECTION SOLUTION
Freq: Once | INTRAMUSCULAR | Status: AC | PRN
Start: 2022-07-08 — End: 2022-07-08
  Administered 2022-07-08: 50 mL via INTRAVENOUS

## 2022-07-08 MED ORDER — FENTANYL (PF) 50 MCG/ML INJECTION WRAPPER
INJECTION | Freq: Once | INTRAMUSCULAR | Status: DC | PRN
Start: 2022-07-08 — End: 2022-07-08
  Administered 2022-07-08 (×2): 50 ug via INTRAVENOUS

## 2022-07-08 MED ORDER — FAMOTIDINE (PF) 20 MG/2 ML INTRAVENOUS SOLUTION
20.0000 mg | Freq: Once | INTRAVENOUS | Status: AC
Start: 2022-07-08 — End: 2022-07-08
  Administered 2022-07-08: 20 mg via INTRAVENOUS

## 2022-07-08 SURGICAL SUPPLY — 38 items
BANDAGE MDCHC 75X3IN STRL STRCH GAUZE NON ACUTE COMPRESS LTX DISP (WOUND CARE SUPPLY) ×1 IMPLANT
BLADE 15 2 END CBNSTL SURG STRL DISP (SURGICAL CUTTING SUPPLIES) ×2 IMPLANT
CLEANER INSTR PREPZYME MUL-TRD CONTAINR NARSL NEUT PH BDGR 22OZ (MISCELLANEOUS PT CARE ITEMS) ×1
CONV USE 123874 - SYRINGE AMSURE MDCHC 60CC LF  STRL TIP PRTC SM TUBE ADPR IRRG DISC BULB POLYPROP (MED SURG SUPPLIES) ×1 IMPLANT
CONV USE 339048 - COVER TBL 90X50IN STD SMS REINF FNFLD STRL LF  DISP (DRAPE/PACKS/SHEETS/OR TOWEL) ×1
CONV USE ITEM 329146 - CLEANER INSTR PREPZYME MUL-TRD CONTAINR NARSL NEUT PH BDGR 22OZ (MISCELLANEOUS PT CARE ITEMS) ×1 IMPLANT
COUNTER 20 CNT BLOCK ADH NEEDLE STRL LF  RD SHARP FOAM 15.75X11.5X14IN DISP (MED SURG SUPPLIES) ×1 IMPLANT
COVER TBL 90X50IN STD SMS REINF FNFLD STRL LF  DISP (DRAPE/PACKS/SHEETS/OR TOWEL) ×1 IMPLANT
DRAPE ABS REINF ELAS FEN CNTCT CLSR 146X110X75IN HAND PRXM LF  STRL DISP SURG SMS 42X18IN (DRAPE/PACKS/SHEETS/OR TOWEL) ×1 IMPLANT
DRAPE FNFLD ABS REINF 77X53IN 43528 PRXM LF  STRL DISP SURG SMS 44X23IN (DRAPE/PACKS/SHEETS/OR TOWEL) ×1 IMPLANT
DRESS PETRO 9X5IN CURAD XR COTTON NONADH OCL IMPREGNATE LF  STRL WHT (WOUND CARE SUPPLY) ×1 IMPLANT
GLOVE SURG 6 LF  PF BEAD CUF STRL CRM 11.3IN PROTEXIS PLISPRN THK9.1 MIL (GLOVES AND ACCESSORIES) IMPLANT
GLOVE SURG 6 LF  PF SMOOTH BEAD CUF INTLK STRL BLU 11.3IN PROTEXIS NEU-THERA PLISPRN THK7.9 MIL (GLOVES AND ACCESSORIES) IMPLANT
GLOVE SURG 6.5 LF  PF BEAD CUF STRL CRM 11.3IN PROTEXIS PI PLISPRN THK9.1 MIL (GLOVES AND ACCESSORIES) ×1 IMPLANT
GLOVE SURG 6.5 LF  PF SMOOTH BEAD CUF INTLK STRL BLU 11.3IN PROTEXIS NEU-THERA PLISPRN THK7.9 MIL (GLOVES AND ACCESSORIES) IMPLANT
GLOVE SURG 6.5 LTX PF SMOOTH BEAD CUF STRL YW 11.5IN PROTEXIS NEU-THERA DDRGL THK8.7 MIL (GLOVES AND ACCESSORIES) IMPLANT
GLOVE SURG 7 LF  PF BEAD CUF STRL CRM 11.8IN PROTEXIS PI PLISPRN THK9.1 MIL (GLOVES AND ACCESSORIES) IMPLANT
GLOVE SURG 7 LF  PF SMOOTH BEAD CUF INTLK STRL BLU 11.8IN PROTEXIS NEU-THERA PLISPRN THK7.9 MIL (GLOVES AND ACCESSORIES) ×1 IMPLANT
GLOVE SURG 7 LTX PF SMOOTH BEAD CUF STRL YW 12IN PROTEXIS NEU-THERA DDRGL THK8.7 MIL (GLOVES AND ACCESSORIES) IMPLANT
GLOVE SURG 7.5 LF  PF BEAD CUF STRL CRM 11.8IN PROTEXIS PI PLISPRN THK9.1 MIL (GLOVES AND ACCESSORIES) ×1 IMPLANT
GLOVE SURG 7.5 LF  PF SMOOTH BEAD CUF INTLK STRL BLU 11.8IN PROTEXIS NEU-THERA PLISPRN THK7.9 MIL (GLOVES AND ACCESSORIES) IMPLANT
GLOVE SURG 7.5 LTX PF SMOOTH BEAD CUF STRL YW 12IN PROTEXIS (GLOVES AND ACCESSORIES) ×1 IMPLANT
GLOVE SURG 8 LF  PF BEAD CUF STRL CRM 11.8IN PROTEXIS PI PLISPRN THK9.1 MIL (GLOVES AND ACCESSORIES) IMPLANT
GLOVE SURG 8 LF  PF SMOOTH BEAD CUF INTLK STRL BLU 11.8IN PROTEXIS NEU-THERA PLISPRN THK7.9 MIL (GLOVES AND ACCESSORIES) IMPLANT
GLOVE SURG 8 LTX PF SMOOTH BEAD CUF STRL YW 12IN PROTEXIS NEU-THERA DDRGL THK8.7 MIL (GLOVES AND ACCESSORIES) IMPLANT
GLOVE SURG 8.5 LF  PF BEAD CUF STRL CRM 11.8IN PROTEXIS PI PLISPRN THK9.1 MIL (GLOVES AND ACCESSORIES) IMPLANT
GOWN SURG LRG STD LGTH REG L3 NONREINFORCE BRTHBL TWL STRL LF  DISP BLU HALYARD SPECTRUM SMS (DRAPE/PACKS/SHEETS/OR TOWEL) ×2 IMPLANT
GOWN SURG XL STD LGTH L3 NONREINFORCE HKLP CLSR TWL STRL LF  DISP BLU SPECTRUM SMS (DRAPE/PACKS/SHEETS/OR TOWEL) ×1
GOWN SURG XL STD LGTH L3 NONREINFORCE HKLP CLSR TWL STRL LF DISP BLU SPECTRUM SMS (DRAPE/PACKS/SHEETS/OR TOWEL) ×1 IMPLANT
LABEL MED CORRECT MED LABELING SYS 4 FLG 2 SHEET 24 PRPRNT STRL (MED SURG SUPPLIES) ×1 IMPLANT
SOL IRRG 0.9% NACL 1000ML PLASTIC PR BTL ISTNC N-PYRG STRL LF (MEDICATIONS/SOLUTIONS) IMPLANT
SOL SURG PREP 26ML DRPRP 74% ISPRP 0.7% IOD POVACRYLEX SLF CNTN APPL SKIN STRL PREOP (MED SURG SUPPLIES) ×1 IMPLANT
SPONGE GAUZE 4X4IN MDCHC COTTON 12 PLY TY 7 LF  STRL DISP (WOUND CARE SUPPLY) ×4 IMPLANT
STKNT ORTHO 48X4IN COTTON PLSTR 1 PLY PCUT SEWN END LF  TUB STRL OFF WHT (ORTHOPEDICS (NOT IMPLANTS)) ×1 IMPLANT
SUTURE 5-0 C-13 SURGIPRO 2 18IN BLUE MONOF NON AB (SUTURE/WOUND CLOSURE) ×1 IMPLANT
SYRINGE AMSURE MDCHC 60CC LF  STRL TIP PRTC SM TUBE ADPR IRRG DISC BULB POLYPROP (MED SURG SUPPLIES) ×1
TOWEL 24X16IN COTTON BLU DISP SURG STRL LF (DRAPE/PACKS/SHEETS/OR TOWEL) ×1 IMPLANT
WOUND IRRG IRRISEPT DBRD CLNSG 0.05% CHG SYSTEM STRL LF (WOUND CARE SUPPLY) ×1 IMPLANT

## 2022-07-08 NOTE — OR Surgeon (Signed)
Patient taken [facility]   Operative Note   PATIENT NAME:  Steven Henderson, Steven Henderson  MRN:  [id number]  DOB:  09/12/1950    Date of Procedure:  07/08/2022  Preoperative Diagnosis: LEFT FOURTH TRIGGER FINGER   Postoperative Diagnoses:  LEFT FOURTH TRIGGER FINGER   Procedure Performed: Procedure(s) (LRB):  LEFT FOURTH TRIGGER FINGER RELEASE (Left)    Surgeon: Marcha Dutton, DO   Anesthesia: Bier Block  Estimated Blood Loss: Minimal  Complications: None immediate  Description of Procedure:  Patient taken to the operating room given an IV regional anesthetic the left arm prepped with DuraPrep draped in a sterile manner.  Oblique incision over the A1 pulley of the ring finger taken down to the flexor tendon sheath.  Neurovascular bundles protected the pulley released in its entirety under direct vision.  Flexor tendons were pulled freely into the wound.  Wound irrigated copiously closed with 4-0 Prolene suture in an interrupted fashion sterile bandage applied tourniquet released patient was brought to recovery in stable condition.  Marcha Dutton, DO   This note was partially generated using MModal Fluency Direct system, and there may be some incorrect words, spellings, and punctuation that were not noted in checking the note before saving.

## 2022-07-08 NOTE — Anesthesia Procedure Notes (Signed)
Steven Henderson    IVRA    Performing Provider:  Darcel Smalling, CRNA Authorizing Provider:  Yevette Edwards, MD     Indication: surgical analgesia    Other Diagnosis: Left fourth trigger finger  Pt location: In OR    Left  Tourniquet location: above elbow   Tourniquet inflation 250 mmHg   The entire limb was elevated to allow for passive exsanguination then a bandage is wrapped from distal to proximal .  After completion of the injection, the PIV was removed and intact.  Medications:  lidocaine PF (XYLOCAINE-MPF) 0.5% injection - Intravenous, other-see comment   50 mL - 07/08/2022 7:56:00 AM  NOTES: 22 g PIV to dorsal left hand; PIV removed following Lidocaine admin

## 2022-07-08 NOTE — Interval H&P Note (Signed)
H & P updated the day of the procedure.  1.  H&P completed within 30 days of surgical procedure and has been reviewed within 24 hours of admission but prior to surgery or a procedure requiring anesthesia services, the patient has been examined, and no change has occured in the patients condition since the H&P was completed.       Change in medications: No        No LMP recorded.      Comments:     2.  Patient continues to be appropriate candidate for planned surgical procedure. YES    Cynthia Stainback, DO

## 2022-07-08 NOTE — Anesthesia Preprocedure Evaluation (Signed)
ANESTHESIA PRE-OP EVALUATION  Planned Procedure: LEFT FOURTH TRIGGER FINGER RELEASE (Left: Finger)  Review of Systems     anesthesia history negative     patient summary reviewed  nursing notes reviewed        Pulmonary   recent URI (2weeks ago) and resolved,   Cardiovascular    Hypertension, well controlled, ECG reviewed, ACE / ARB inhibitor use and hyperlipidemia ,No peripheral edema,  Exercise Tolerance: > or = 4 METS   ,beta blocker therapy  ,taken in last 24 hours     GI/Hepatic/Renal   negative GI/hepatic/renal ROS,         Endo/Other    obesity,   type 2 diabetes/ stable/ controlled with oral medications    Neuro/Psych/MS   negative neuro/psych ROS,      Cancer    negative hematology/oncology ROS,                     Physical Assessment      Airway       Mallampati: III    TM distance: 3 FB    Neck ROM: full  Mouth Opening: good.  Facial hair  No Beard        Dental           (+) poor dentition, missing           Pulmonary    Breath sounds clear to auscultation  (-) no rhonchi, no decreased breath sounds, no wheezes, no rales and no stridor     Cardiovascular    Rhythm: regular  Rate: Normal  (-) no friction rub, carotid bruit is not present, no peripheral edema and no murmur     Other findings              Plan  ASA 3     Planned anesthesia type: regional block           PONV Plan:  I plan to administer pharmcologic prophalaxis antiemetics  POV PLAN:   plan for postoperative opioid use                Anesthesia issues/risks discussed are: Stroke, Aspiration, Failure of Block, Nerve Injuries, Local Anesthetic Systemic Toxicity, PONV and Cardiac Events/MI.  Anesthetic plan and risks discussed with patient  signed consent obtained          Patient's NPO status is appropriate for Anesthesia.           Plan discussed with CRNA.    (Left upper extremity Bier Block)

## 2022-07-08 NOTE — H&P (Signed)
Paper H and P on chart, will be scanned to EMR.

## 2022-07-08 NOTE — Discharge Instructions (Signed)
Follow up in office on March 4th, at 9:25 AM.    Call the office with any questions or concerns that you may have.     Do Not lift, pull, push, or tug anything with your operative hand until approved by your doctor.     Leave your dressing in place for two day. After two days, you may remove the dressing.     Keep the incision clean and dry. Wash around the incision with soap and water only.     Cover the incision with dressings provided. Keep the incision covered until your follow up appointment.     Resume home medications as instructed.     Stop by your pharmacy and pick up your prescription.

## 2022-07-08 NOTE — Anesthesia Postprocedure Evaluation (Signed)
Anesthesia Post Op Evaluation    Patient: Steven Henderson  Procedure(s):  LEFT FOURTH TRIGGER FINGER RELEASE    Last Vitals:Temperature: 36.2 C (97.1 F) (07/08/22 0834)  Heart Rate: 60 (07/08/22 0847)  BP (Non-Invasive): 103/65 (07/08/22 0847)  Respiratory Rate: (!) 11 (07/08/22 0847)  SpO2: 97 % (07/08/22 0847)    No notable events documented.    Patient is sufficiently recovered from the effects of anesthesia to participate in the evaluation and has returned to their pre-procedure level.  Patient location during evaluation: PACU       Patient participation: complete - patient participated  Level of consciousness: awake and alert and responsive to verbal stimuli    Pain score: 0  Pain management: adequate  Airway patency: patent    Anesthetic complications: no  Cardiovascular status: acceptable  Respiratory status: acceptable  Hydration status: acceptable  Patient post-procedure temperature: Pt Normothermic   PONV Status: Absent

## 2022-07-08 NOTE — Anesthesia Transfer of Care (Signed)
ANESTHESIA TRANSFER OF CARE   Steven Henderson is a 72 y.o. ,male, Weight: 93 kg (205 lb)   had Procedure(s):  LEFT FOURTH TRIGGER FINGER RELEASE  performed  07/08/22   Primary Service: Marcha Dutton, DO    Past Medical History:   Diagnosis Date    Anxiety state     BPH (benign prostatic hyperplasia)     Depression, acute     Diabetes mellitus, type 2 (CMS HCC)     Elevated PSA     Generalized OA     Hemochromatosis     Hyperlipidemia     Hypertension     Hypomagnesemia     Insomnia     Testicular hypofunction     Vitamin D deficiency       Allergy History as of 07/08/22        No Known Allergies                  I completed my transfer of care / handoff to the receiving personnel during which we discussed:  Access, Airway, All key/critical aspects of case discussed, Analgesia, Antibiotics, Expectation of post procedure, Fluids/Product, Gave opportunity for questions and acknowledgement of understanding, Labs and PMHx      Post Location: PACU                                                             Last OR Temp: Temperature: 36.2 C (97.1 F)  ABG:  POTASSIUM   Date Value Ref Range Status   06/23/2022 4.2 3.5 - 5.1 mmol/L Final     KETONES   Date Value Ref Range Status   06/23/2022 Negative Negative, Trace mg/dL Final     CALCIUM   Date Value Ref Range Status   06/23/2022 10.1 8.6 - 10.3 mg/dL Final     Airway:* No LDAs found *  Blood pressure 103/71, pulse 63, temperature 36.2 C (97.1 F), resp. rate 13, height 1.727 m ('5\' 8"'$ ), weight 93 kg (205 lb), SpO2 96%.

## 2022-07-08 NOTE — OR Nursing (Signed)
Irrisept used intra-op

## 2022-07-23 ENCOUNTER — Other Ambulatory Visit: Payer: Self-pay

## 2022-07-23 ENCOUNTER — Inpatient Hospital Stay
Admission: EM | Admit: 2022-07-23 | Discharge: 2022-07-24 | DRG: 390 | Disposition: A | Discharge status: Home / Self Care | Payer: Medicare Other | Attending: HOSPITALIST-INTERNAL MEDICINE | Admitting: HOSPITALIST-INTERNAL MEDICINE

## 2022-07-23 ENCOUNTER — Inpatient Hospital Stay (HOSPITAL_COMMUNITY): Payer: Medicare Other | Admitting: HOSPITALIST-INTERNAL MEDICINE

## 2022-07-23 ENCOUNTER — Encounter (HOSPITAL_COMMUNITY): Payer: Self-pay

## 2022-07-23 ENCOUNTER — Inpatient Hospital Stay (HOSPITAL_COMMUNITY): Payer: Medicare Other

## 2022-07-23 ENCOUNTER — Emergency Department (HOSPITAL_COMMUNITY): Payer: Medicare Other

## 2022-07-23 DIAGNOSIS — K567 Ileus, unspecified: Principal | ICD-10-CM | POA: Diagnosis present

## 2022-07-23 DIAGNOSIS — E860 Dehydration: Secondary | ICD-10-CM | POA: Diagnosis present

## 2022-07-23 DIAGNOSIS — R1110 Vomiting, unspecified: Secondary | ICD-10-CM | POA: Insufficient documentation

## 2022-07-23 DIAGNOSIS — K56600 Partial intestinal obstruction, unspecified as to cause: Secondary | ICD-10-CM | POA: Diagnosis present

## 2022-07-23 DIAGNOSIS — R197 Diarrhea, unspecified: Secondary | ICD-10-CM | POA: Diagnosis present

## 2022-07-23 DIAGNOSIS — I10 Essential (primary) hypertension: Secondary | ICD-10-CM | POA: Diagnosis present

## 2022-07-23 DIAGNOSIS — E785 Hyperlipidemia, unspecified: Secondary | ICD-10-CM | POA: Diagnosis present

## 2022-07-23 DIAGNOSIS — M159 Polyosteoarthritis, unspecified: Secondary | ICD-10-CM | POA: Diagnosis present

## 2022-07-23 DIAGNOSIS — E119 Type 2 diabetes mellitus without complications: Secondary | ICD-10-CM

## 2022-07-23 DIAGNOSIS — R109 Unspecified abdominal pain: Secondary | ICD-10-CM | POA: Diagnosis present

## 2022-07-23 DIAGNOSIS — Z7984 Long term (current) use of oral hypoglycemic drugs: Secondary | ICD-10-CM

## 2022-07-23 DIAGNOSIS — R053 Chronic cough: Secondary | ICD-10-CM | POA: Diagnosis present

## 2022-07-23 DIAGNOSIS — N400 Enlarged prostate without lower urinary tract symptoms: Secondary | ICD-10-CM | POA: Diagnosis present

## 2022-07-23 DIAGNOSIS — K229 Disease of esophagus, unspecified: Secondary | ICD-10-CM | POA: Diagnosis present

## 2022-07-23 DIAGNOSIS — R112 Nausea with vomiting, unspecified: Secondary | ICD-10-CM | POA: Diagnosis present

## 2022-07-23 DIAGNOSIS — Z7982 Long term (current) use of aspirin: Secondary | ICD-10-CM

## 2022-07-23 DIAGNOSIS — T50996A Underdosing of other drugs, medicaments and biological substances, initial encounter: Secondary | ICD-10-CM | POA: Diagnosis present

## 2022-07-23 DIAGNOSIS — Z91148 Patient's other noncompliance with medication regimen for other reason: Secondary | ICD-10-CM

## 2022-07-23 DIAGNOSIS — E1165 Type 2 diabetes mellitus with hyperglycemia: Secondary | ICD-10-CM | POA: Diagnosis present

## 2022-07-23 DIAGNOSIS — Z79899 Other long term (current) drug therapy: Secondary | ICD-10-CM | POA: Insufficient documentation

## 2022-07-23 DIAGNOSIS — Z9049 Acquired absence of other specified parts of digestive tract: Secondary | ICD-10-CM

## 2022-07-23 LAB — COMPREHENSIVE METABOLIC PANEL, NON-FASTING
ALBUMIN/GLOBULIN RATIO: 1.4 (ref 0.8–1.4)
ALBUMIN: 4.3 g/dL (ref 3.5–5.7)
ALKALINE PHOSPHATASE: 48 U/L (ref 34–104)
ALT (SGPT): 34 U/L (ref 7–52)
ANION GAP: 11 mmol/L (ref 4–13)
AST (SGOT): 23 U/L (ref 13–39)
BILIRUBIN TOTAL: 1 mg/dL (ref 0.3–1.2)
BUN/CREA RATIO: 27 — ABNORMAL HIGH (ref 6–22)
BUN: 30 mg/dL — ABNORMAL HIGH (ref 7–25)
CALCIUM, CORRECTED: 9.4 mg/dL (ref 8.9–10.8)
CALCIUM: 9.6 mg/dL (ref 8.6–10.3)
CHLORIDE: 101 mmol/L (ref 98–107)
CO2 TOTAL: 25 mmol/L (ref 21–31)
CREATININE: 1.13 mg/dL (ref 0.60–1.30)
ESTIMATED GFR: 70 mL/min/{1.73_m2} (ref >59)
GLOBULIN: 3.1 (ref 2.9–5.4)
GLUCOSE: 263 mg/dL — ABNORMAL HIGH (ref 74–109)
OSMOLALITY, CALCULATED: 289 mOsm/kg (ref 270–290)
POTASSIUM: 4.4 mmol/L (ref 3.5–5.1)
PROTEIN TOTAL: 7.4 g/dL (ref 6.4–8.9)
SODIUM: 137 mmol/L (ref 136–145)

## 2022-07-23 LAB — CBC WITH DIFF
BASOPHIL #: 0 10*3/uL (ref 0.00–0.10)
BASOPHIL %: 0 % (ref 0–1)
EOSINOPHIL #: 0.1 10*3/uL (ref 0.00–0.50)
EOSINOPHIL %: 1 %
HCT: 48.3 % — ABNORMAL HIGH (ref 36.7–47.1)
HGB: 16.8 g/dL — ABNORMAL HIGH (ref 12.5–16.3)
LYMPHOCYTE #: 0.4 10*3/uL — ABNORMAL LOW (ref 1.00–3.00)
LYMPHOCYTE %: 3 % — ABNORMAL LOW (ref 16–44)
MCH: 32.1 pg (ref 23.8–33.4)
MCHC: 34.8 g/dL (ref 32.5–36.3)
MCV: 92.3 fL (ref 73.0–96.2)
MONOCYTE #: 1.1 10*3/uL — ABNORMAL HIGH (ref 0.30–1.00)
MONOCYTE %: 7 % (ref 5–13)
MPV: 10.1 fL (ref 7.4–11.4)
NEUTROPHIL #: 13.9 10*3/uL — ABNORMAL HIGH (ref 1.85–7.80)
NEUTROPHIL %: 89 % — ABNORMAL HIGH (ref 43–77)
PLATELETS: 191 10*3/uL (ref 140–440)
RBC: 5.23 10*6/uL (ref 4.06–5.63)
RDW: 13.9 % (ref 12.1–16.2)
WBC: 15.6 10*3/uL — ABNORMAL HIGH (ref 3.6–10.2)

## 2022-07-23 LAB — URINALYSIS, MACROSCOPIC
BILIRUBIN: NEGATIVE mg/dL
BLOOD: 0.03 mg/dL
GLUCOSE: 500 mg/dL — AB
KETONES: 60 mg/dL — AB
LEUKOCYTES: NEGATIVE WBCs/uL
NITRITE: NEGATIVE
PH: 5 (ref 5.0–9.0)
PROTEIN: NEGATIVE mg/dL
SPECIFIC GRAVITY: 1.023 (ref 1.002–1.030)
UROBILINOGEN: NORMAL mg/dL

## 2022-07-23 LAB — C-REACTIVE PROTEIN(CRP),INFLAMMATION: C-REACTIVE PROTEIN (CRP): 0.6 mg/dL — ABNORMAL HIGH (ref 0.1–0.5)

## 2022-07-23 LAB — LACTIC ACID LEVEL W/ REFLEX FOR LEVEL >2.0: LACTIC ACID: 1.6 mmol/L (ref 0.5–2.2)

## 2022-07-23 LAB — GOLD TOP TUBE

## 2022-07-23 LAB — URINALYSIS, MICROSCOPIC
HYALINE CASTS: 4 /lpf — ABNORMAL HIGH (ref <0)
RBCS: 3 /hpf (ref <4)
SQUAMOUS EPITHELIAL: <1 /hpf (ref <28)
WBCS: 1 /hpf (ref <6)

## 2022-07-23 LAB — POC BLOOD GLUCOSE (RESULTS)
GLUCOSE, POC: 181 mg/dl — ABNORMAL HIGH (ref 70–100)
GLUCOSE, POC: 98 mg/dl (ref 70–100)

## 2022-07-23 LAB — BLUE TOP TUBE

## 2022-07-23 LAB — MAGNESIUM: MAGNESIUM: 1.5 mg/dL — ABNORMAL LOW (ref 1.9–2.7)

## 2022-07-23 MED ORDER — SODIUM CHLORIDE 0.9 % INTRAVENOUS SOLUTION
INTRAVENOUS | Status: DC
Start: 2022-07-23 — End: 2022-07-24

## 2022-07-23 MED ORDER — HEPARIN (PORCINE) 5,000 UNIT/ML INJECTION SOLUTION
5000.0000 [IU] | Freq: Three times a day (TID) | INTRAMUSCULAR | Status: DC
Start: 2022-07-23 — End: 2022-07-24
  Administered 2022-07-23 (×2): 5000 [IU] via SUBCUTANEOUS
  Administered 2022-07-24: 0 [IU] via SUBCUTANEOUS
  Administered 2022-07-24: 5000 [IU] via SUBCUTANEOUS
  Filled 2022-07-23 (×3): qty 1

## 2022-07-23 MED ORDER — SODIUM CHLORIDE 0.9 % IV BOLUS
500.0000 mL | INJECTION | Status: AC
Start: 2022-07-23 — End: 2022-07-23
  Administered 2022-07-23: 0 mL via INTRAVENOUS
  Administered 2022-07-23: 500 mL via INTRAVENOUS

## 2022-07-23 MED ORDER — SODIUM CHLORIDE 0.9 % INTRAVENOUS PIGGYBACK
3.3750 g | INJECTION | INTRAVENOUS | Status: AC
Start: 2022-07-23 — End: 2022-07-23
  Administered 2022-07-23: 3.375 g via INTRAVENOUS
  Administered 2022-07-23: 0 g via INTRAVENOUS

## 2022-07-23 MED ORDER — BUPRENORPHINE HCL 0.3 MG/ML INJECTION SOLUTION
0.1500 mg | Freq: Four times a day (QID) | INTRAMUSCULAR | Status: DC | PRN
Start: 2022-07-23 — End: 2022-07-24

## 2022-07-23 MED ORDER — ONDANSETRON HCL (PF) 4 MG/2 ML INJECTION SOLUTION
INTRAMUSCULAR | Status: AC
Start: 2022-07-23 — End: 2022-07-23
  Filled 2022-07-23: qty 2

## 2022-07-23 MED ORDER — MORPHINE 2 MG/ML INJECTION WRAPPER
INJECTION | INTRAMUSCULAR | Status: AC
Start: 2022-07-23 — End: 2022-07-23
  Filled 2022-07-23: qty 1

## 2022-07-23 MED ORDER — ONDANSETRON HCL (PF) 4 MG/2 ML INJECTION SOLUTION
4.0000 mg | Freq: Four times a day (QID) | INTRAMUSCULAR | Status: DC | PRN
Start: 2022-07-23 — End: 2022-07-24

## 2022-07-23 MED ORDER — HEPARIN (PORCINE) 5,000 UNIT/ML INJECTION SOLUTION
INTRAMUSCULAR | Status: AC
Start: 2022-07-23 — End: 2022-07-23
  Filled 2022-07-23: qty 1

## 2022-07-23 MED ORDER — MAGNESIUM SULFATE 1 GRAM/100 ML IN DEXTROSE 5 % INTRAVENOUS PIGGYBACK
1.0000 g | INJECTION | INTRAVENOUS | Status: AC
Start: 2022-07-23 — End: 2022-07-23
  Administered 2022-07-23 (×2): 0 g via INTRAVENOUS
  Administered 2022-07-23 (×2): 1 g via INTRAVENOUS
  Filled 2022-07-23 (×2): qty 100

## 2022-07-23 MED ORDER — PIPERACILLIN-TAZOBACTAM 3.375 GRAM INTRAVENOUS SOLUTION
INTRAVENOUS | Status: AC
Start: 2022-07-23 — End: 2022-07-23
  Filled 2022-07-23: qty 15

## 2022-07-23 MED ORDER — AMLODIPINE 5 MG TABLET
10.0000 mg | ORAL_TABLET | Freq: Every day | ORAL | Status: DC
Start: 2022-07-23 — End: 2022-07-24
  Administered 2022-07-23 – 2022-07-24 (×2): 10 mg via ORAL
  Filled 2022-07-23: qty 2

## 2022-07-23 MED ORDER — INSULIN LISPRO 100 UNIT/ML SUB-Q SSIP VIAL
0.0000 [IU] | INJECTION | Freq: Four times a day (QID) | SUBCUTANEOUS | Status: DC
Start: 2022-07-23 — End: 2022-07-24
  Administered 2022-07-23 – 2022-07-24 (×4): 0 [IU] via SUBCUTANEOUS

## 2022-07-23 MED ORDER — ONDANSETRON HCL (PF) 4 MG/2 ML INJECTION SOLUTION
4.0000 mg | INTRAMUSCULAR | Status: AC
Start: 2022-07-23 — End: 2022-07-23
  Administered 2022-07-23: 4 mg via INTRAVENOUS

## 2022-07-23 MED ORDER — IOHEXOL 350 MG IODINE/ML INTRAVENOUS SOLUTION
75.0000 mL | INTRAVENOUS | Status: AC
Start: 2022-07-23 — End: 2022-07-23
  Administered 2022-07-23: 75 mL via INTRAVENOUS

## 2022-07-23 MED ORDER — ASPIRIN 81 MG CHEWABLE TABLET
CHEWABLE_TABLET | ORAL | Status: AC
Start: 2022-07-23 — End: 2022-07-23
  Filled 2022-07-23: qty 1

## 2022-07-23 MED ORDER — SODIUM CHLORIDE 0.9 % INTRAVENOUS SOLUTION
INTRAVENOUS | Status: DC
Start: 2022-07-23 — End: 2022-07-24
  Administered 2022-07-23: 0 mL via INTRAVENOUS

## 2022-07-23 MED ORDER — MORPHINE 2 MG/ML INJECTION WRAPPER
2.0000 mg | INJECTION | INTRAMUSCULAR | Status: AC
Start: 2022-07-23 — End: 2022-07-23
  Administered 2022-07-23: 2 mg via INTRAVENOUS

## 2022-07-23 MED ORDER — DEXTROSE 40 % ORAL GEL
15.0000 g | ORAL | Status: DC | PRN
Start: 2022-07-23 — End: 2022-07-24

## 2022-07-23 MED ORDER — ASPIRIN 81 MG CHEWABLE TABLET
81.0000 mg | CHEWABLE_TABLET | Freq: Every day | ORAL | Status: DC
Start: 2022-07-23 — End: 2022-07-24
  Administered 2022-07-23 – 2022-07-24 (×2): 81 mg via ORAL
  Filled 2022-07-23: qty 1

## 2022-07-23 MED ORDER — GLUCAGON 1 MG/ML SOLUTION FOR INJECTION
1.0000 mg | Freq: Once | INTRAMUSCULAR | Status: DC | PRN
Start: 2022-07-23 — End: 2022-07-24

## 2022-07-23 MED ORDER — AMLODIPINE 5 MG TABLET
ORAL_TABLET | ORAL | Status: AC
Start: 2022-07-23 — End: 2022-07-23
  Filled 2022-07-23: qty 2

## 2022-07-23 MED ORDER — DEXTROSE 50 % IN WATER (D50W) INTRAVENOUS SYRINGE
12.5000 g | INJECTION | INTRAVENOUS | Status: DC | PRN
Start: 2022-07-23 — End: 2022-07-24

## 2022-07-23 MED ORDER — SODIUM CHLORIDE 0.9 % INTRAVENOUS PIGGYBACK
INJECTION | INTRAVENOUS | Status: AC
Start: 2022-07-23 — End: 2022-07-23
  Filled 2022-07-23: qty 100

## 2022-07-23 NOTE — H&P (Signed)
[facility]  Hospitalist  History and Physical    Velma, Hanna  Date of Admission:  07/23/2022  Date of Birth:  [birth year]  PCP: Nettie Elm, DO      REASON FOR ADMISSION: Ileus vs part SBO, NVD    HPI:  Patient presents to the ER this a.m. with complaints of moderate to severe abdominal pain with nausea vomiting and diarrhea that started at 3:00 a.m. this morning.  He states that his son had nausea and vomiting that lasted approximately 3 days a couple of days ago.  He denies fever or chills.  CBC is elevated at 15.6.  BUN is 30 with a normal creatinine of 1.13.  Glucose is 263.  He states that he had his A1c checked approximately 1 month ago and it was a little over 10.  His PCP called in [place], however his insurance will not pay for this and he states he can not afford it.  He is only taking metformin at this time.  He denies previous diagnosis of gastroparesis.  Abdominal CT shows findings of ileus in setting of infection or early partial SBO.  Lower esophageal wall thickening may be signs and symptoms of esophageal reflux.  He has had EGD and colonoscopy in the past and states it has been approximately 10 years ago.  He states that he has had a dry cough for approximately 4 months and states that he feels like he has something in his throat.  He is unsure of his medications but ACE inhibitor is not noted on medication list.  He states that he went to urgent Care for the cough and was given an inhaler and some unknown pills and was told he has bronchitis.      ROS: Full review of systems performed negative with exception of pertinent negatives and positives mentioned in HPI.      Past Medical History:   Diagnosis Date    Anxiety state     BPH (benign prostatic hyperplasia)     Depression, acute     Diabetes mellitus, type 2 (CMS HCC)     Elevated PSA     Generalized OA     Hemochromatosis     Hyperlipidemia     Hypertension     Hypomagnesemia     Insomnia     Testicular  hypofunction     Vitamin D deficiency              Past Surgical History:   Procedure Laterality Date    CYSTOSCOPY  06/23/2017    HX APPENDECTOMY               Medications Prior to Admission       Prescriptions    albuterol sulfate (PROVENTIL OR VENTOLIN OR PROAIR) 90 mcg/actuation Inhalation oral inhaler    Take 1-2 Puffs by inhalation Every 6 hours as needed    amLODIPine (NORVASC) 10 mg Oral Tablet    TAKE (1) TABLET DAILY    aspirin (ECOTRIN) 81 mg Oral Tablet, Delayed Release (E.C.)    Take 1 Tablet (81 mg total) by mouth Once a day    cyanocobalamin (VITAMIN B12) 2,500 mcg Sublingual Tablet, Sublingual    Place 1 Tablet (2,500 mcg total) under the tongue Once a day    finasteride (PROSCAR) 5 mg Oral Tablet    Take 1 Tablet (5 mg total) by mouth Once a day    glimepiride (AMARYL) 4 mg Oral Tablet  Take 1 Tablet (4 mg total) by mouth Twice daily    hydroCHLOROthiazide (HYDRODIURIL) 25 mg Oral Tablet    Take 1 Tablet (25 mg total) by mouth Once a day    HYDROcodone-acetaminophen (NORCO) 5-325 mg Oral Tablet    Take 1 Tablet by mouth Every 4 hours as needed for Pain    meloxicam (MOBIC) 15 mg Oral Tablet    TAKE (1) TABLET DAILY WITH FOOD.    Patient taking differently:  Take 1 Tablet (15 mg total) by mouth Once a day    methocarbamoL (ROBAXIN) 500 mg Oral Tablet    TAKE (1) TABLET FOUR TIMES DAILY.    Patient taking differently:  Take 1 Tablet (500 mg total) by mouth Four times a day TAKE (1) TABLET FOUR TIMES DAILY.    Methylprednisolone (MEDROL DOSEPACK) 4 mg Oral Tablets, Dose Pack    Take by mouth Per Instructions    rosuvastatin (CRESTOR) 10 mg Oral Tablet    Take 1 Tablet (10 mg total) by mouth Every evening    Patient taking differently:  Take 1 Tablet (10 mg total) by mouth Once a day    tamsulosin (FLOMAX) 0.4 mg Oral Capsule    Take 1 Capsule (0.4 mg total) by mouth Every evening after dinner    telmisartan (MICARDIS) 80 mg Oral Tablet    Take 1 Tablet (80 mg total) by mouth Once a day               No Known Allergies    Social History     Tobacco Use    Smoking status: Never    Smokeless tobacco: Never   Vaping Use    Vaping status: Never Used   Substance Use Topics    Alcohol use: Not Currently    Drug use: Never       Family History:    Family Medical History:       Problem Relation (Age of Onset)    Liver Cancer Father    Stroke Mother            PHYSICAL EXAM:    Physical Exam  HENT:      Mouth/Throat:      Mouth: Mucous membranes are dry.   Cardiovascular:      Rate and Rhythm: Normal rate and regular rhythm.      Pulses: Normal pulses.      Heart sounds: Normal heart sounds.   Pulmonary:      Effort: Pulmonary effort is normal.      Breath sounds: Normal breath sounds.   Abdominal:      General: Bowel sounds are decreased.      Tenderness: There is abdominal tenderness.   Musculoskeletal:      Cervical back: Neck supple.      Right lower leg: No edema.      Left lower leg: No edema.   Skin:     General: Skin is warm and dry.      Capillary Refill: Capillary refill takes less than 2 seconds.      Coloration: Skin is not jaundiced.   Neurological:      Mental Status: He is alert and oriented to person, place, and time.   Psychiatric:         Behavior: Behavior normal.           VITALS:  Temperature: 36.8 C (98.2 F)  Heart Rate: 94  BP (Non-Invasive): 116/62  Respiratory Rate: 20  SpO2: 96 %  Labs:    I have reviewed all lab results.    Imaging Studies:    CT ABDOMEN PELVIS W IV CONTRAST   Final Result   Nonspecific fluid distended loops of small bowel in the mid and lower abdomen. Findings could reflect ileus in the setting of infection or early partial small bowel obstruction, though no discrete transition point is identified.      Lower esophageal wall thickening may be correlated for signs and symptoms of esophageal reflux.      One or more dose reduction techniques were used (e.g., Automated exposure control, adjustment of the mA and/or kV according to patient size, use of iterative  reconstruction technique).         Radiologist location ID: [id number]             Assessment/Plan:       Active Hospital Problems   (*Primary Problem)    Diagnosis    *Ileus (CMS HCC)    Nausea, vomiting and diarrhea    Abdominal pain    Chronic cough    Diabetes mellitus, type 2 (CMS HCC)     Chronic     Ileus vs part SBO  NPO except ice chips for now  Repeat abdominal x-ray in the a.m.  We will hold off on NG tube for now to see if ileus will resolve  Consult surgery  IV fluids    Chronic cough  Obtain chest CT with IV contrast in 48 hours  We will need EGD as outpatient for reflux    DM II  A1c over 10 1 month ago  Control with coverage with sliding scale for now    N/V/D with abd pain  Likely secondary to infectious process leading to ileus, patient's son with same symptoms this week  Pain and anti nausea medication as needed  [facility] GI panel and C diff ordered  Hold abx for now, until C diff results  NPO except ice chips for now      Angelina Pih, [place]:  I interviewed & evaluated the patient. I participated in the key portions of service. No new complaints.  I discussed the case with the NP & nursing.  I reviewed the pt's HPI, ROS, PMH, PSH, FH, & SocHx. I reviewed the pt's medications, labs, imaging, & orders. I agree with the documented assessment, management, & plan.    PHYSICAL EXAM  -----  General:  Pt in NAD; AAOx3  CV:  +S1/S2; RRR; 0 m/r/g  Pulm:  Lungs CTAB; 0 r/r/w; symmetrical chest wall expansion  GI:  +Hypoactive BSx4; soft; NTND; 0 rebound/guarding; 0 TTP; 0 CVA tenderness to percussion  Psych:  Cooperative; appropriate mood & affect    Small bowel obstruction (partial) vs. Ileus:  Agree w/ assessment & plan outlined above.  Pending GenSurg consult.  Pt w/ no hx of abd instrumentation.  Will make NPO & give supportive care.--MF

## 2022-07-23 NOTE — ED Nurses Note (Signed)
Tele applied and patient transported to bed assignment at this time.

## 2022-07-23 NOTE — ED Nurses Note (Signed)
Patient to ER 21 via triage c/o abdominal pain, N/V/D, and generalized weakness. Patient reports s/s started at 0300. Denies any sick contacts. Patient is awake, A&O x 4. NAD noted. Patient placed in gown and on bedside monitor (cardiac, BP, and pulse ox). VSS. Call light within reach.

## 2022-07-23 NOTE — ED Triage Notes (Signed)
N/v/d started this am around 3 am . With weakness

## 2022-07-23 NOTE — ED Nurses Note (Signed)
Report called to [facility] on 3S.

## 2022-07-23 NOTE — Consults (Signed)
[facility]  General Surgery  Consultation    Date of Service:  07/23/2022  Steven Henderson, Steven Henderson, 72 y.o. male  Date of Admission:  07/23/2022  Date of Birth:  1951/02/20  PCP: Nettie Elm, DO    Reason for Consultation:  ileus    HPI:  Steven Henderson is a 72 y.o. White male who presented to the ER with weakness, diarrhea vomiting and abdominal pain since 3 am this morning.  No history of any recent viral illness, but son was recently ill with a gastrointestinal virus. History of appendectomy.  No past history of ileus or small bowel obstruction. Last colonoscopy/EGD was 5-10 years ago. CT with IV contrast shows nonspecific fluid distended loops of small bowel in the mid and lower abdomen.  Findings could reflect ileus in the setting of infection or early partial small-bowel obstruction.  No discrete transition point is identified.  Lower esophageal wall thickening may be correlated for signs and symptoms of esophageal reflux.  White count is slightly elevated at 15.6. Lactic acid is 1.6.       Past Medical History:   Diagnosis Date    Anxiety state     BPH (benign prostatic hyperplasia)     Depression, acute     Diabetes mellitus, type 2 (CMS HCC)     Elevated PSA     Generalized OA     Hemochromatosis     Hyperlipidemia     Hypertension     Hypomagnesemia     Insomnia     Testicular hypofunction     Vitamin D deficiency       Past Surgical History:   Procedure Laterality Date    CYSTOSCOPY  06/23/2017    HX APPENDECTOMY        Social History     Tobacco Use    Smoking status: Never    Smokeless tobacco: Never   Vaping Use    Vaping status: Never Used   Substance Use Topics    Alcohol use: Not Currently    Drug use: Never       Family Medical History:       Problem Relation (Age of Onset)    Liver Cancer Father    Stroke Mother           Medications Prior to Admission       Prescriptions    albuterol sulfate (PROVENTIL OR VENTOLIN OR PROAIR) 90 mcg/actuation Inhalation oral inhaler    Take 1-2 Puffs by  inhalation Every 6 hours as needed    amLODIPine (NORVASC) 10 mg Oral Tablet    TAKE (1) TABLET DAILY    aspirin (ECOTRIN) 81 mg Oral Tablet, Delayed Release (E.C.)    Take 1 Tablet (81 mg total) by mouth Once a day    cyanocobalamin (VITAMIN B12) 2,500 mcg Sublingual Tablet, Sublingual    Place 1 Tablet (2,500 mcg total) under the tongue Once a day    finasteride (PROSCAR) 5 mg Oral Tablet    Take 1 Tablet (5 mg total) by mouth Once a day    glimepiride (AMARYL) 4 mg Oral Tablet    Take 1 Tablet (4 mg total) by mouth Twice daily    hydroCHLOROthiazide (HYDRODIURIL) 25 mg Oral Tablet    Take 1 Tablet (25 mg total) by mouth Once a day    HYDROcodone-acetaminophen (NORCO) 5-325 mg Oral Tablet    Take 1 Tablet by mouth Every 4 hours as needed for Pain    meloxicam (MOBIC)  15 mg Oral Tablet    TAKE (1) TABLET DAILY WITH FOOD.    Patient taking differently:  Take 1 Tablet (15 mg total) by mouth Once a day    methocarbamoL (ROBAXIN) 500 mg Oral Tablet    TAKE (1) TABLET FOUR TIMES DAILY.    Patient taking differently:  Take 1 Tablet (500 mg total) by mouth Four times a day TAKE (1) TABLET FOUR TIMES DAILY.    Methylprednisolone (MEDROL DOSEPACK) 4 mg Oral Tablets, Dose Pack    Take by mouth Per Instructions    rosuvastatin (CRESTOR) 10 mg Oral Tablet    Take 1 Tablet (10 mg total) by mouth Every evening    Patient taking differently:  Take 1 Tablet (10 mg total) by mouth Once a day    tamsulosin (FLOMAX) 0.4 mg Oral Capsule    Take 1 Capsule (0.4 mg total) by mouth Every evening after dinner    telmisartan (MICARDIS) 80 mg Oral Tablet    Take 1 Tablet (80 mg total) by mouth Once a day           No Known Allergies       Patient Vitals for the past 24 hrs:   BP Temp Pulse Resp SpO2 Height Weight   07/23/22 1600 -- -- 84 (!) 22 94 % -- --   07/23/22 1545 108/88 -- 89 (!) 23 95 % -- --   07/23/22 1530 129/74 -- 94 (!) 21 99 % -- --   07/23/22 1515 120/73 -- 86 14 98 % -- --   07/23/22 1500 126/75 -- -- -- 96 % -- --    07/23/22 1445 138/71 -- 88 20 100 % -- --   07/23/22 1430 125/73 -- 84 15 98 % -- --   07/23/22 1415 124/66 -- 86 15 96 % -- --   07/23/22 1400 116/64 -- 82 17 100 % -- --   07/23/22 1345 117/61 -- 89 (!) 22 98 % -- --   07/23/22 1330 (!) 106/50 -- 79 18 94 % -- --   07/23/22 1315 (!) 113/59 -- 87 (!) 22 94 % -- --   07/23/22 1300 107/67 -- 90 15 96 % -- --   07/23/22 1245 120/74 -- 92 17 93 % -- --   07/23/22 1230 110/67 -- 91 18 93 % -- --   07/23/22 1215 136/75 -- 97 17 95 % -- --   07/23/22 1200 117/76 -- 97 (!) 21 95 % -- --   07/23/22 1145 116/62 -- 94 20 96 % -- --   07/23/22 1130 137/73 -- (!) 103 (!) 27 96 % -- --   07/23/22 1115 132/86 -- 96 13 96 % -- --   07/23/22 1100 123/62 -- 95 (!) 22 95 % -- --   07/23/22 1045 (!) 117/58 -- 89 18 95 % -- --   07/23/22 1030 118/62 -- 88 (!) 22 95 % -- --   07/23/22 1015 128/68 -- 89 17 98 % -- --   07/23/22 1000 128/85 -- 90 19 96 % -- --   07/23/22 0945 135/75 -- 87 19 93 % -- --   07/23/22 0930 (!) 144/75 -- 88 (!) 22 96 % -- --   07/23/22 0915 128/64 -- 88 (!) 23 94 % -- --   07/23/22 0900 123/66 -- 88 (!) 24 94 % -- --   07/23/22 0845 128/67 -- 88 (!) 23 93 % -- --   07/23/22 0830 139/71 --  90 (!) 28 96 % -- --   07/23/22 0815 128/70 -- 96 (!) 22 97 % -- --   07/23/22 0800 135/78 -- -- -- -- -- --   07/23/22 0746 (!) 141/72 36.8 C (98.2 F) 89 18 99 % 1.768 m (5' 9.6") 90.7 kg (200 lb)          Physical Exam  Constitutional:       General: He is awake.      Appearance: Normal appearance. He is well-developed.   Cardiovascular:      Rate and Rhythm: Normal rate and regular rhythm.      Heart sounds: Normal heart sounds.   Pulmonary:      Effort: Pulmonary effort is normal.      Breath sounds: Normal breath sounds.   Abdominal:      General: Bowel sounds are normal. There is distension.      Palpations: Abdomen is soft.      Tenderness: There is abdominal tenderness in the right lower quadrant.   Neurological:      Mental Status: He is alert.   Psychiatric:          Behavior: Behavior is cooperative.          Laboratory Data:     Results for orders placed or performed during the hospital encounter of 07/23/22 (from the past 24 hour(s))   COMPREHENSIVE METABOLIC PANEL, NON-FASTING   Result Value Ref Range    SODIUM 137 136 - 145 mmol/L    POTASSIUM 4.4 3.5 - 5.1 mmol/L    CHLORIDE 101 98 - 107 mmol/L    CO2 TOTAL 25 21 - 31 mmol/L    ANION GAP 11 4 - 13 mmol/L    BUN 30 (H) 7 - 25 mg/dL    CREATININE 1.13 0.60 - 1.30 mg/dL    BUN/CREA RATIO 27 (H) 6 - 22    ESTIMATED GFR 70 >59 mL/min/1.92m^2    ALBUMIN 4.3 3.5 - 5.7 g/dL    CALCIUM 9.6 8.6 - 10.3 mg/dL    GLUCOSE 263 (H) 74 - 109 mg/dL    ALKALINE PHOSPHATASE 48 34 - 104 U/L    ALT (SGPT) 34 7 - 52 U/L    AST (SGOT) 23 13 - 39 U/L    BILIRUBIN TOTAL 1.0 0.3 - 1.2 mg/dL    PROTEIN TOTAL 7.4 6.4 - 8.9 g/dL    ALBUMIN/GLOBULIN RATIO 1.4 0.8 - 1.4    OSMOLALITY, CALCULATED 289 270 - 290 mOsm/kg    CALCIUM, CORRECTED 9.4 8.9 - 10.8 mg/dL    GLOBULIN 3.1 2.9 - 5.4   LACTIC ACID LEVEL W/ REFLEX FOR LEVEL >2.0   Result Value Ref Range    LACTIC ACID 1.6 0.5 - 2.2 mmol/L   C-REACTIVE PROTEIN(CRP),INFLAMMATION   Result Value Ref Range    C-REACTIVE PROTEIN (CRP) 0.6 (H) 0.1 - 0.5 mg/dL   CBC WITH DIFF   Result Value Ref Range    WBC 15.6 (H) 3.6 - 10.2 x10^3/uL    RBC 5.23 4.06 - 5.63 x10^6/uL    HGB 16.8 (H) 12.5 - 16.3 g/dL    HCT 48.3 (H) 36.7 - 47.1 %    MCV 92.3 73.0 - 96.2 fL    MCH 32.1 23.8 - 33.4 pg    MCHC 34.8 32.5 - 36.3 g/dL    RDW 13.9 12.1 - 16.2 %    PLATELETS 191 140 - 440 x10^3/uL    MPV 10.1 7.4 - 11.4 fL  NEUTROPHIL % 89 (H) 43 - 77 %    LYMPHOCYTE % 3 (L) 16 - 44 %    MONOCYTE % 7 5 - 13 %    EOSINOPHIL % 1 %    BASOPHIL % 0 0 - 1 %    NEUTROPHIL # 13.90 (H) 1.85 - 7.80 x10^3/uL    LYMPHOCYTE # 0.40 (L) 1.00 - 3.00 x10^3/uL    MONOCYTE # 1.10 (H) 0.30 - 1.00 x10^3/uL    EOSINOPHIL # 0.10 0.00 - 0.50 x10^3/uL    BASOPHIL # 0.00 0.00 - 0.10 x10^3/uL   MAGNESIUM   Result Value Ref Range    MAGNESIUM 1.5 (L) 1.9 -  2.7 mg/dL   BLUE TOP TUBE   Result Value Ref Range    RAINBOW/EXTRA TUBE AUTO RESULT Yes    GOLD TOP TUBE   Result Value Ref Range    RAINBOW/EXTRA TUBE AUTO RESULT Yes    GOLD TOP TUBE   Result Value Ref Range    RAINBOW/EXTRA TUBE AUTO RESULT Yes    URINALYSIS, MACROSCOPIC   Result Value Ref Range    COLOR Light Yellow Colorless, Light Yellow, Yellow    APPEARANCE Clear Clear    SPECIFIC GRAVITY 1.023 1.002 - 1.030    PH 5.0 5.0 - 9.0    LEUKOCYTES Negative Negative, 100  WBCs/uL    NITRITE Negative Negative    PROTEIN Negative Negative, 10 , 20  mg/dL    GLUCOSE 500 (A) Negative, 30  mg/dL    KETONES 60 (A) Negative, Trace mg/dL    BILIRUBIN Negative Negative, 0.5 mg/dL    BLOOD 0.03 Negative, 0.03 mg/dL    UROBILINOGEN Normal Normal mg/dL   URINALYSIS, MICROSCOPIC   Result Value Ref Range    MUCOUS Occasional (A) (none) /hpf    RBCS 3 <4 /hpf    WBCS 1 <6 /hpf    HYALINE CASTS 4 (H) <0 /lpf    SQUAMOUS EPITHELIAL <1 <28 /hpf   POC BLOOD GLUCOSE (RESULTS)   Result Value Ref Range    GLUCOSE, POC 181 (H) 70 - 100 mg/dl       Imaging Studies:    CT CHEST W IV CONTRAST   Final Result by Edi, Radresults In (03/07 1419)   Clear lungs      Lower esophageal wall thickening raises the suspicion for esophagitis.         One or more dose reduction techniques were used (e.g., Automated exposure control, adjustment of the mA and/or kV according to patient size, use of iterative reconstruction technique).         Radiologist location ID: [id number]         CT ABDOMEN PELVIS W IV CONTRAST   Final Result by Edi, Radresults In (03/07 1018)   Nonspecific fluid distended loops of small bowel in the mid and lower abdomen. Findings could reflect ileus in the setting of infection or early partial small bowel obstruction, though no discrete transition point is identified.      Lower esophageal wall thickening may be correlated for signs and symptoms of esophageal reflux.      One or more dose reduction techniques were used (e.g.,  Automated exposure control, adjustment of the mA and/or kV according to patient size, use of iterative reconstruction technique).         Radiologist location ID: [id number]              Assessment/Plan:    I personally saw and evaluated  the patient as part of a shared service with an APP.    My substantive findings are:    Admitted with nausea and vomiting.  Underwent a CT scan interpreted as probable ileus.  Improved almost immediately upon admission.  No further nausea and vomiting.  Abdominal pain much diminished.  Has had no previous abdominal procedures.  Unlikely this represents a surgical small-bowel obstruction.  Will start liquids.  Further determination of course depends on his tolerance of oral intake.    Those substantive findings include the advanced practice clinician's documentation being reviewed/amended in its entirety with the medical decision making portion completely performed independently by me for this encounter.  The substantive findings documented by me are in BOLD above.  Bridgett Larsson MD MBA CPE FACS        This note was partially created using voice recognition software and is inherently subject to errors including those of syntax and "sound alike " substitutions which may escape proof reading. In such instances, original meaning may be extrapolated by contextual derivation.

## 2022-07-23 NOTE — ED Provider Notes (Signed)
Emergency Medicine      Name: Steven Henderson  Age and Gender: 72 y.o. male  Date of Birth: 28-Jun-1950  MRN: [id number]  PCP: Nettie Elm, DO    CC:  Chief Complaint   Patient presents with    Vomiting       HPI:  Steven Henderson is a 72 y.o. White male who presents to the ER with abdominal pain, vomiting and diarrhea that began around 3:00 a.m. this morning.  Patient describes the abdominal pain is sharp and constant, but denies any aggravating or alleviating factors.  He denies fever or known sick contacts.    Below pertinent information reviewed with patient:  Past Medical History:   Diagnosis Date    Anxiety state     BPH (benign prostatic hyperplasia)     Depression, acute     Diabetes mellitus, type 2 (CMS HCC)     Elevated PSA     Generalized OA     Hemochromatosis     Hyperlipidemia     Hypertension     Hypomagnesemia     Insomnia     Testicular hypofunction     Vitamin D deficiency          No Known Allergies    Past Surgical History:   Procedure Laterality Date    CYSTOSCOPY  06/23/2017    HX APPENDECTOMY            Social History     Socioeconomic History    Marital status: Married   Tobacco Use    Smoking status: Never    Smokeless tobacco: Never   Vaping Use    Vaping status: Never Used   Substance and Sexual Activity    Alcohol use: Not Currently    Drug use: Never    Sexual activity: Not Currently     Social Determinants of Health     Financial Resource Strain: Low Risk  (05/26/2022)    Financial Resource Strain     SDOH Financial: No   Transportation Needs: Low Risk  (05/26/2022)    Transportation Needs     SDOH Transportation: No   Social Connections: Low Risk  (05/26/2022)    Social Connections     SDOH Social Isolation: 5 or more times a week   Intimate Partner Violence: Low Risk  (05/26/2022)    Intimate Partner Violence     SDOH Domestic Violence: No   Housing Stability: Low Risk  (05/26/2022)    Housing Stability     SDOH Housing Situation: I have housing.     SDOH Housing Worry: No       ROS:  No other overt  positive review of systems are noted other than stated in the HPI.      Objective:    ED Triage Vitals [07/23/22 0746]   BP (Non-Invasive) (!) 141/72   Heart Rate 89   Respiratory Rate 18   Temperature 36.8 C (98.2 F)   SpO2 99 %   Weight 90.7 kg (200 lb)   Height 1.768 m (5' 9.6")     Filed Vitals:    07/23/22 1015 07/23/22 1030 07/23/22 1045 07/23/22 1100   BP: 128/68 118/62 (!) 117/58 123/62   Pulse: 89 88 89 95   Resp: 17 (!) 22 18 (!) 22   Temp:       SpO2: 98% 95% 95% 95%       Nursing notes and vital signs reviewed.    Constitutional - No acute  distress.  Alert and Active.  HEENT - Normocephalic. Conjunctiva clear.  Moist mucous membranes.   Neck - Trachea midline. No stridor. No hoarseness.  Cardiac - Regular rate and rhythm. No murmurs, rubs, or gallops.   Respiratory/Chest - Normal respiratory effort. Clear to auscultation bilaterally. No rales, wheezes or rhonchi.   Abdomen - Normal bowel sounds. Tenderness in the RLQ, periumbilical and suprapubic abdomen, soft, non-distended. No rebound or guarding.   Musculoskeletal - Good AROM. No clubbing, cyanosis or edema.  Skin - Warm and dry, without any rashes or other lesions.  Neuro - Alert and oriented x 3. Moving all extremities symmetrically.   Psych - Normal mood and affect. Behavior is normal       Any pertinent labs and imaging obtained during this encounter reviewed below in MDM.    MDM/ED Course:      Medical Decision Making  Patient presented to the ER with abdominal pain, vomiting and diarrhea that began around 3:00 a.m. this morning.  Patient describes the abdominal pain is sharp and constant, but denies any aggravating or alleviating factors.  He denies fever or known sick contacts. Differential diagnoses include gastroenteritis, colitis, ileus. He underwent diagnostics with results as noted in ED course.  Patient was initially treated for pain and nausea with morphine and Zofran as well as given saline bolus.  After his white blood cell count  resulted elevated, Zosyn was added.  CT scan shows ileus versus partial small-bowel obstruction.  Hospitalist was consulted who was agreeable to admit for further management.    Amount and/or Complexity of Data Reviewed  Labs: ordered. Decision-making details documented in ED Course.  Radiology: ordered. Decision-making details documented in ED Course.  ECG/medicine tests: independent interpretation performed.    Risk  Prescription drug management.  Parenteral controlled substances.  Decision regarding hospitalization.          ED Course as of 07/23/22 1113   Thu Jul 23, 2022   1034 CT ABDOMEN PELVIS W IV CONTRAST  Nonspecific fluid distended loops of small bowel in the mid and lower abdomen. Findings could reflect ileus in the setting of infection or early partial small bowel obstruction, though no discrete transition point is identified.     Lower esophageal wall thickening may be correlated for signs and symptoms of esophageal reflux.   1106 Discussed with Dr Elvina Sidle who will admit   1108 CBC/DIFF(!)  WBC 15.6, hemoglobin 16.8, hematocrit 48.3, PMNs 89   1109 COMPREHENSIVE METABOLIC PANEL, NON-FASTING(!)  BUN 30, glucose 263   1109 LACTIC ACID: 1.6  Normal   1109 URINALYSIS, MACROSCOPIC AND MICROSCOPIC W/CULTURE REFLEX(!)  Glucose 500, ketones 60, hyaline casts 4       Orders Placed This Encounter    ADULT ROUTINE BLOOD CULTURE, SET OF 2 ADULT BOTTLES (BACTERIA AND YEAST)    ADULT ROUTINE BLOOD CULTURE, SET OF 2 ADULT BOTTLES (BACTERIA AND YEAST)    CT ABDOMEN PELVIS W IV CONTRAST    CBC/DIFF    COMPREHENSIVE METABOLIC PANEL, NON-FASTING    URINALYSIS, MACROSCOPIC AND MICROSCOPIC W/CULTURE REFLEX    LACTIC ACID LEVEL W/ REFLEX FOR LEVEL >2.0    C-REACTIVE PROTEIN(CRP),INFLAMMATION    CBC WITH DIFF    URINALYSIS, MACROSCOPIC    URINALYSIS, MICROSCOPIC    EXTRA TUBES    BLUE TOP TUBE    GOLD TOP TUBE    GOLD TOP TUBE    ondansetron (ZOFRAN) 2 mg/mL injection    morphine 2 mg/mL injection  NS bolus infusion 500 mL     piperacillin-tazobactam (ZOSYN) 3.375 g in NS 100 mL IVPB minibag    iohexol (OMNIPAQUE 350) infusion    NS premix infusion         Impression:   Clinical Impression   Partial small bowel obstruction (CMS HCC) (Primary)   Dehydration       Disposition: Admitted    / Elige Ko PA-C  07/23/2022, 08:23  [facility]  Department of Emergency Medicine  [facility]    Portions of this note may have been dictated using voice recognition software.     -----------------------  Results for orders placed or performed during the hospital encounter of 07/23/22 (from the past 12 hour(s))   COMPREHENSIVE METABOLIC PANEL, NON-FASTING   Result Value Ref Range    SODIUM 137 136 - 145 mmol/L    POTASSIUM 4.4 3.5 - 5.1 mmol/L    CHLORIDE 101 98 - 107 mmol/L    CO2 TOTAL 25 21 - 31 mmol/L    ANION GAP 11 4 - 13 mmol/L    BUN 30 (H) 7 - 25 mg/dL    CREATININE 1.13 0.60 - 1.30 mg/dL    BUN/CREA RATIO 27 (H) 6 - 22    ESTIMATED GFR 70 >59 mL/min/1.99m^2    ALBUMIN 4.3 3.5 - 5.7 g/dL    CALCIUM 9.6 8.6 - 10.3 mg/dL    GLUCOSE 263 (H) 74 - 109 mg/dL    ALKALINE PHOSPHATASE 48 34 - 104 U/L    ALT (SGPT) 34 7 - 52 U/L    AST (SGOT) 23 13 - 39 U/L    BILIRUBIN TOTAL 1.0 0.3 - 1.2 mg/dL    PROTEIN TOTAL 7.4 6.4 - 8.9 g/dL    ALBUMIN/GLOBULIN RATIO 1.4 0.8 - 1.4    OSMOLALITY, CALCULATED 289 270 - 290 mOsm/kg    CALCIUM, CORRECTED 9.4 8.9 - 10.8 mg/dL    GLOBULIN 3.1 2.9 - 5.4   LACTIC ACID LEVEL W/ REFLEX FOR LEVEL >2.0   Result Value Ref Range    LACTIC ACID 1.6 0.5 - 2.2 mmol/L   C-REACTIVE PROTEIN(CRP),INFLAMMATION   Result Value Ref Range    C-REACTIVE PROTEIN (CRP) 0.6 (H) 0.1 - 0.5 mg/dL   CBC WITH DIFF   Result Value Ref Range    WBC 15.6 (H) 3.6 - 10.2 x10^3/uL    RBC 5.23 4.06 - 5.63 x10^6/uL    HGB 16.8 (H) 12.5 - 16.3 g/dL    HCT 48.3 (H) 36.7 - 47.1 %    MCV 92.3 73.0 - 96.2 fL    MCH 32.1 23.8 - 33.4 pg    MCHC 34.8 32.5 - 36.3 g/dL    RDW 13.9 12.1 - 16.2 %    PLATELETS 191 140 - 440 x10^3/uL     MPV 10.1 7.4 - 11.4 fL    NEUTROPHIL % 89 (H) 43 - 77 %    LYMPHOCYTE % 3 (L) 16 - 44 %    MONOCYTE % 7 5 - 13 %    EOSINOPHIL % 1 %    BASOPHIL % 0 0 - 1 %    NEUTROPHIL # 13.90 (H) 1.85 - 7.80 x10^3/uL    LYMPHOCYTE # 0.40 (L) 1.00 - 3.00 x10^3/uL    MONOCYTE # 1.10 (H) 0.30 - 1.00 x10^3/uL    EOSINOPHIL # 0.10 0.00 - 0.50 x10^3/uL    BASOPHIL # 0.00 0.00 - 0.10 x10^3/uL   BLUE TOP TUBE   Result  Value Ref Range    RAINBOW/EXTRA TUBE AUTO RESULT Yes    URINALYSIS, MACROSCOPIC   Result Value Ref Range    COLOR Light Yellow Colorless, Light Yellow, Yellow    APPEARANCE Clear Clear    SPECIFIC GRAVITY 1.023 1.002 - 1.030    PH 5.0 5.0 - 9.0    LEUKOCYTES Negative Negative, 100  WBCs/uL    NITRITE Negative Negative    PROTEIN Negative Negative, 10 , 20  mg/dL    GLUCOSE 500 (A) Negative, 30  mg/dL    KETONES 60 (A) Negative, Trace mg/dL    BILIRUBIN Negative Negative, 0.5 mg/dL    BLOOD 0.03 Negative, 0.03 mg/dL    UROBILINOGEN Normal Normal mg/dL   URINALYSIS, MICROSCOPIC   Result Value Ref Range    MUCOUS Occasional (A) (none) /hpf    RBCS 3 <4 /hpf    WBCS 1 <6 /hpf    HYALINE CASTS 4 (H) <0 /lpf    SQUAMOUS EPITHELIAL <1 <28 /hpf     CT ABDOMEN PELVIS W IV CONTRAST   Final Result   Nonspecific fluid distended loops of small bowel in the mid and lower abdomen. Findings could reflect ileus in the setting of infection or early partial small bowel obstruction, though no discrete transition point is identified.      Lower esophageal wall thickening may be correlated for signs and symptoms of esophageal reflux.      One or more dose reduction techniques were used (e.g., Automated exposure control, adjustment of the mA and/or kV according to patient size, use of iterative reconstruction technique).         Radiologist location ID: [id number]

## 2022-07-24 ENCOUNTER — Inpatient Hospital Stay (HOSPITAL_COMMUNITY): Payer: Medicare Other

## 2022-07-24 DIAGNOSIS — E860 Dehydration: Secondary | ICD-10-CM

## 2022-07-24 DIAGNOSIS — K567 Ileus, unspecified: Secondary | ICD-10-CM

## 2022-07-24 DIAGNOSIS — K56600 Partial intestinal obstruction, unspecified as to cause: Principal | ICD-10-CM

## 2022-07-24 DIAGNOSIS — R112 Nausea with vomiting, unspecified: Secondary | ICD-10-CM

## 2022-07-24 LAB — CBC WITH DIFF
BASOPHIL #: 0 10*3/uL (ref 0.00–0.10)
BASOPHIL %: 0 % (ref 0–1)
EOSINOPHIL #: 0.1 10*3/uL (ref 0.00–0.50)
EOSINOPHIL %: 1 %
HCT: 41.6 % (ref 36.7–47.1)
HGB: 14.3 g/dL (ref 12.5–16.3)
LYMPHOCYTE #: 0.9 10*3/uL — ABNORMAL LOW (ref 1.00–3.00)
LYMPHOCYTE %: 17 % (ref 16–44)
MCH: 31.8 pg (ref 23.8–33.4)
MCHC: 34.3 g/dL (ref 32.5–36.3)
MCV: 92.6 fL (ref 73.0–96.2)
MONOCYTE #: 0.6 10*3/uL (ref 0.30–1.00)
MONOCYTE %: 11 % (ref 5–13)
MPV: 9.9 fL (ref 7.4–11.4)
NEUTROPHIL #: 3.8 10*3/uL (ref 1.85–7.80)
NEUTROPHIL %: 70 % (ref 43–77)
PLATELETS: 125 10*3/uL — ABNORMAL LOW (ref 140–440)
RBC: 4.49 10*6/uL (ref 4.06–5.63)
RDW: 13.9 % (ref 12.1–16.2)
WBC: 5.4 10*3/uL (ref 3.6–10.2)

## 2022-07-24 LAB — POC BLOOD GLUCOSE (RESULTS)
GLUCOSE, POC: 101 mg/dl — ABNORMAL HIGH (ref 70–100)
GLUCOSE, POC: 89 mg/dl (ref 70–100)

## 2022-07-24 LAB — BASIC METABOLIC PANEL
ANION GAP: 6 mmol/L (ref 4–13)
BUN/CREA RATIO: 21 (ref 6–22)
BUN: 19 mg/dL (ref 7–25)
CALCIUM: 8.4 mg/dL — ABNORMAL LOW (ref 8.6–10.3)
CHLORIDE: 107 mmol/L (ref 98–107)
CO2 TOTAL: 23 mmol/L (ref 21–31)
CREATININE: 0.92 mg/dL (ref 0.60–1.30)
ESTIMATED GFR: 89 mL/min/{1.73_m2} (ref >59)
GLUCOSE: 95 mg/dL (ref 74–109)
OSMOLALITY, CALCULATED: 274 mOsm/kg (ref 270–290)
POTASSIUM: 4 mmol/L (ref 3.5–5.1)
SODIUM: 136 mmol/L (ref 136–145)

## 2022-07-24 LAB — MAGNESIUM: MAGNESIUM: 2.2 mg/dL (ref 1.9–2.7)

## 2022-07-24 MED ORDER — DEXTROSE 5 % AND 0.45 % SODIUM CHLORIDE INTRAVENOUS SOLUTION
INTRAVENOUS | Status: DC
Start: 2022-07-24 — End: 2022-07-24

## 2022-07-24 MED ORDER — PANTOPRAZOLE 40 MG TABLET,DELAYED RELEASE
40.0000 mg | DELAYED_RELEASE_TABLET | Freq: Every day | ORAL | 0 refills | Status: AC
Start: 2022-07-24 — End: 2022-09-07

## 2022-07-24 MED ORDER — ONDANSETRON 4 MG DISINTEGRATING TABLET
4.0000 mg | ORAL_TABLET | Freq: Three times a day (TID) | ORAL | 0 refills | Status: AC | PRN
Start: 2022-07-24 — End: 2022-07-29

## 2022-07-24 NOTE — Nurses Notes (Signed)
Pt was discharged home with his wife, discharge instructions and paperwork gone over with pt, no questions at this time, saline lock removed intact and a pressure dressing applied, tele monitor returned, pt was taken down in a chair

## 2022-07-24 NOTE — Progress Notes (Signed)
[facility]  General Surgery  Follow-Up Consultation    Date of Service:  07/24/2022  Steven Henderson, Steven Henderson, 72 y.o. male  Date of Admission:  07/23/2022  Date of Birth:  11-26-1950  PCP: Nettie Elm, DO    HPI: Reports feeling better this am. Mildly tender.  Denies n/v, diarrhea.  Passing flatus, no BM.  Tolerating full liquids.     amLODIPine (NORVASC) tablet, 10 mg, Oral, Daily  aspirin chewable tablet 81 mg, 81 mg, Oral, Daily  buprenorphine (BUPRENEX) 0.3 mg/mL injection, 0.15 mg, Intravenous, Q6H PRN  Correction/SSIP insulin lispro (HumaLOG) 100 units/mL injection, 0-18 Units, Subcutaneous, 4x/day AC  D5W 1/2 NS premix infusion, , Intravenous, Continuous  dextrose (GLUTOSE) 40% oral gel, 15 g, Oral, Q15 Min PRN  dextrose 50% (0.5 g/mL) injection - syringe, 12.5 g, Intravenous, Q15 Min PRN  glucagon (GLUCAGEN DIAGNOSTIC KIT) injection 1 mg, 1 mg, IntraMUSCULAR, Once PRN  heparin 5,000 unit/mL injection, 5,000 Units, Subcutaneous, Q8HRS  NS premix infusion, , Intravenous, Continuous  ondansetron (ZOFRAN) 2 mg/mL injection, 4 mg, Intravenous, Q6H PRN         No Known Allergies          Filed Vitals:    07/23/22 2034 07/23/22 2229 07/23/22 2336 07/24/22 0744   BP: 109/65  104/66 112/67   Pulse: 82 83 79 66   Resp: '18  18 20   '$ Temp: 37 C (98.6 F)  37.2 C (98.9 F) 36.6 C (97.9 F)   SpO2: 94%  94% 95%       Physical Exam  Constitutional:       General: He is awake.      Appearance: Normal appearance. He is well-developed.   Cardiovascular:      Rate and Rhythm: Normal rate and regular rhythm.      Heart sounds: Normal heart sounds.   Pulmonary:      Effort: Pulmonary effort is normal.      Breath sounds: Normal breath sounds.   Abdominal:      General: Bowel sounds are normal.      Palpations: Abdomen is soft.      Tenderness: There is abdominal tenderness (mild) in the right lower quadrant.   Neurological:      Mental Status: He is alert.   Psychiatric:         Behavior: Behavior is cooperative.               Laboratory Data:     Results for orders placed or performed during the hospital encounter of 07/23/22 (from the past 24 hour(s))   POC BLOOD GLUCOSE (RESULTS)   Result Value Ref Range    GLUCOSE, POC 181 (H) 70 - 100 mg/dl   POC BLOOD GLUCOSE (RESULTS)   Result Value Ref Range    GLUCOSE, POC 98 70 - 100 mg/dl   POC BLOOD GLUCOSE (RESULTS)   Result Value Ref Range    GLUCOSE, POC 101 (H) 70 - 100 mg/dl   POC BLOOD GLUCOSE (RESULTS)   Result Value Ref Range    GLUCOSE, POC 89 70 - 100 mg/dl   BASIC METABOLIC PANEL, NON-FASTING   Result Value Ref Range    SODIUM 136 136 - 145 mmol/L    POTASSIUM 4.0 3.5 - 5.1 mmol/L    CHLORIDE 107 98 - 107 mmol/L    CO2 TOTAL 23 21 - 31 mmol/L    ANION GAP 6 4 - 13 mmol/L    CALCIUM 8.4 (L)  8.6 - 10.3 mg/dL    GLUCOSE 95 74 - 109 mg/dL    BUN 19 7 - 25 mg/dL    CREATININE 0.92 0.60 - 1.30 mg/dL    BUN/CREA RATIO 21 6 - 22    ESTIMATED GFR 89 >59 mL/min/1.48m2    OSMOLALITY, CALCULATED 274 270 - 290 mOsm/kg   MAGNESIUM   Result Value Ref Range    MAGNESIUM 2.2 1.9 - 2.7 mg/dL   CBC WITH DIFF   Result Value Ref Range    WBC 5.4 3.6 - 10.2 x10^3/uL    RBC 4.49 4.06 - 5.63 x10^6/uL    HGB 14.3 12.5 - 16.3 g/dL    HCT 41.6 36.7 - 47.1 %    MCV 92.6 73.0 - 96.2 fL    MCH 31.8 23.8 - 33.4 pg    MCHC 34.3 32.5 - 36.3 g/dL    RDW 13.9 12.1 - 16.2 %    PLATELETS 125 (L) 140 - 440 x10^3/uL    MPV 9.9 7.4 - 11.4 fL    NEUTROPHIL % 70 43 - 77 %    LYMPHOCYTE % 17 16 - 44 %    MONOCYTE % 11 5 - 13 %    EOSINOPHIL % 1 %    BASOPHIL % 0 0 - 1 %    NEUTROPHIL # 3.80 1.85 - 7.80 x10^3/uL    LYMPHOCYTE # 0.90 (L) 1.00 - 3.00 x10^3/uL    MONOCYTE # 0.60 0.30 - 1.00 x10^3/uL    EOSINOPHIL # 0.10 0.00 - 0.50 x10^3/uL    BASOPHIL # 0.00 0.00 - 0.10 x10^3/uL       No results found.       Assessment/Plan:        I personally saw and evaluated the patient as part of a shared service with an APP.    My substantive findings are:    Essential complete resolution of his GI symptoms.  No other complaints  at the current time.  Tolerating diet so far.  No operative plans at this time.    Those substantive findings include the advanced practice clinician's documentation being reviewed/amended in its entirety with the medical decision making portion completely performed independently by me for this encounter.  The substantive findings documented by me are in BOLD above.  DBridgett LarssonMD MBA CPE FACS            This note was partially created using voice recognition software and is inherently subject to errors including those of syntax and "sound alike " substitutions which may escape proof reading. In such instances, original meaning may be extrapolated by contextual derivation.

## 2022-07-24 NOTE — Discharge Summary (Signed)
[facility]  DISCHARGE SUMMARY    PATIENT NAME:  Steven Henderson, Steven Henderson  MRN:  [id number]  DOB:  June 23, 1950    ENCOUNTER DATE:  07/23/2022  INPATIENT ADMISSION DATE: 07/23/2022  DISCHARGE DATE:  07/24/2022    ATTENDING PHYSICIAN: No att. providers found  SERVICE: PRN HOSPITALIST 5  PRIMARY CARE PHYSICIAN: Nettie Elm, DO       No lay caregiver identified.    PRIMARY DISCHARGE DIAGNOSIS: Ileus (CMS [facility])  Active Hospital Problems    Diagnosis Date Noted    Principal Problem: Ileus (CMS [place]) [K56.7] 07/23/2022    Partial small bowel obstruction (CMS [facility]) [K56.600] 07/24/2022    Dehydration [E86.0] 07/24/2022    Nausea and vomiting, unspecified vomiting type [R11.2] 07/24/2022    Nausea, vomiting and diarrhea [R11.2, R19.7] 07/23/2022    Abdominal pain [R10.9] 07/23/2022    Chronic cough [R05.3] 07/23/2022    Diabetes mellitus, type 2 (CMS [place]) [E11.9] 10/14/2021      Resolved Hospital Problems   No resolved problems to display.     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        Details   ondansetron 4 mg Tablet, Rapid Dissolve  Commonly known as: ZOFRAN ODT   4 mg, Oral, EVERY 8 HOURS PRN  Qty: 8 Tablet  Refills: 0     pantoprazole 40 mg Tablet, Delayed Release (E.C.)  Commonly known as: PROTONIX   40 mg, Oral, DAILY  Qty: 30 Tablet  Refills: 0            CONTINUE these medications which have CHANGED during  your visit.        Details   meloxicam 15 mg Tablet  Commonly known as: MOBIC  What changed: See the new instructions.   TAKE (1) TABLET DAILY WITH FOOD.  Qty: 30 Tablet  Refills: 5     methocarbamoL 500 mg Tablet  Commonly known as: ROBAXIN  What changed:   how much to take  how to take this  when to take this   TAKE (1) TABLET FOUR TIMES DAILY.  Qty: 370 Tablet  Refills: 1     rosuvastatin 10 mg Tablet  Commonly known as: CRESTOR  What changed: when to take this   10 mg, Oral, EVERY EVENING  Qty: 90 Tablet  Refills: 4            CONTINUE these medications - NO CHANGES were made during your visit.        Details   albuterol sulfate 90 mcg/actuation oral inhaler  Commonly known as: PROVENTIL or VENTOLIN or PROAIR   1-2 Puffs, Inhalation, EVERY 6 HOURS PRN  Refills: 0     amLODIPine 10 mg Tablet  Commonly known as: NORVASC   10 mg, Oral, DAILY  Qty: 90 Tablet  Refills: 1     aspirin 81 mg Tablet, Delayed Release (E.C.)  Commonly known as: ECOTRIN   81 mg, Oral, DAILY  Refills: 0     finasteride 5 mg Tablet  Commonly known as: PROSCAR   5 mg, Oral, DAILY  Refills: 0     glimepiride 4 mg Tablet  Commonly known as: AMARYL   4 mg, Oral, 2 TIMES DAILY  Qty: 180 Tablet  Refills: 1     hydroCHLOROthiazide 25 mg Tablet  Commonly known as: HYDRODIURIL   25 mg, Oral, DAILY  Qty: 90 Tablet  Refills: 4     tamsulosin 0.4 mg Capsule  Commonly known as: FLOMAX   0.4 mg, Oral, EVERY EVENING AFTER DINNER  Refills: 0     telmisartan 80 mg Tablet  Commonly known as: MICARDIS   80 mg, Oral, DAILY  Refills: 0            STOP taking these medications.      cyanocobalamin 2,500 mcg Tablet, Sublingual  Commonly known as: VITAMIN B12     HYDROcodone-acetaminophen 5-325 mg Tablet  Commonly known as: NORCO     Methylprednisolone 4 mg Tablets, Dose Pack  Commonly known as: MEDROL DOSEPACK            Discharge med list refreshed?  YES     No Known Allergies  HOSPITAL PROCEDURE(S):   No orders of the defined types were placed in this  encounter.      REASON FOR HOSPITALIZATION AND HOSPITAL COURSE   BRIEF HPI:  This is a 72 y.o., male admitted for N/V/D and abd pain  [place]:     Patient was admitted on 07/23/2022 after presenting to the ER with severe abdominal pain with nausea vomiting diarrhea.  He states he had started just approximately 8 hours prior to coming to the ER.  He states that his son had the same symptoms proximally 3-4 days prior.  CBC was elevated at 15.6.  BUN was 30 with normal creatinine.  Glucose was 263.  States that his A1c was checked 1 month ago and is a little over 10.  His PCP called in on Gero, however his insurance will not pay for this and he can not afford it.  He denies previous diagnosis of gastroparesis.  Abdominal CT shows findings of ileus in setting of infection or early partial small-bowel obstruction.  Lower esophageal wall thickening may be signs and symptoms of esophageal reflux.  He has had EGD and colonoscopy in the past proximally 10 years ago.  He also states that he has had a dry cough for proximally 4 months and he feels like something is stuck in his throat.  His medication list does not list an ACE inhibitor.  He was admitted to the hospitalist and given IV fluids and BioFire and C diff ordered, however patient did not have stool to collect this.  Repeat abdominal film shows resolved ileus.  Chest CT shows  clear lungs, lower esophageal wall thickening suspicious for esophagitis.  Symptoms have completely resolved and patient is tolerating diet.  He was discharged on 07/24/2022.  He was given oral Protonix and instructed to follow up with Dr.Duremdes for EGD and colonoscopy, outpatient.  He is instructed to follow up with his PCP.    TRANSITION/POST DISCHARGE CARE/PENDING TESTS/REFERRALS:     CONDITION ON DISCHARGE:  A. Ambulation: Full ambulation  B. Self-care Ability: Complete  C. Cognitive Status Alert  D.  Code status at discharge:       Physical Exam  HENT:      Mouth/Throat:       Mouth: Mucous membranes are moist   Cardiovascular:      Rate and Rhythm: Normal rate and regular rhythm.      Pulses: Normal pulses.      Heart sounds: Normal heart sounds.   Pulmonary:      Effort: Pulmonary effort is normal.      Breath sounds: Normal breath sounds.   Abdominal:      General: Bowel sounds are normal     Tenderness: There is no tenderness  Musculoskeletal:      Cervical back: Neck supple.      Right lower leg: No edema.      Left lower leg: No edema.   Skin:     General: Skin is warm and dry.      Capillary Refill: Capillary refill takes less than 2 seconds.      Coloration: Skin is not jaundiced.   Neurological:      Mental Status: He is alert and oriented to person, place, and time.   Psychiatric:         Behavior: Behavior normal.   LINES/DRAINS/WOUNDS AT DISCHARGE:   Patient Lines/Drains/Airways Status       Active Line / Dialysis Catheter / Dialysis Graft / Drain / Airway / Wound       Name Placement date Placement time Site Days    Peripheral IV Right Median Cubital  (antecubital fossa) 07/23/22  0810  -- 1    Wound  Right;Anterior Wrist 03/05/22  1304  -- 141    Wound  Incision Left Hand 07/08/22  --  -- 16                    DISCHARGE DISPOSITION:  Home discharge  DISCHARGE INSTRUCTIONS:  Post-Discharge Follow Up Appointments       Follow up with Duremdes, Gene B, MD in 1 week(s)    Phone: [phone]    Where: [facility]    Follow up with Nettie Elm, DO in 1 week(s)    Phone: [phone]    Where: [facility]      Monday Sep 07, 2022    Return Patient Visit with Nettie Elm, DO at  8:30 AM      Friday Oct 23, 2022    Return Patient Visit with Patience Musca, FNP-C at  9:30 AM      Hematology/Oncology, [place]  [facility], [place]  [place] [address]  [phone] Internal Medicine, [employer]  [employer], [place]  [place].  [place] [phone]  [phone]          No discharge procedures on file.       Angelina Pih, NP-C    Copies sent to Care Team         Relationship Specialty Notifications Start End    Nettie Elm, DO PCP - General INTERNAL MEDICINE Admissions 08/25/21     Phone: [phone] Fax: [phone]         [address] [place] [id number]    [place], [place], RN Oncology Nurse Navigator  Abnormal results only, Admissions 10/23/21             Referring providers can utilize [email or web address] to access their referred [place] patient's  information.       ATTESTATION:  I interviewed & evaluated the patient. I participated in the key portions of service. No new complaints.  I discussed the case with the NP & nursing.  I reviewed the pt's HPI, ROS, PMH, PSH, FH, & SocHx. I reviewed the pt's medications, labs, imaging, & orders. I agree with the documented assessment, management, & plan.    PHYSICAL EXAM  -----  General:  Pt in NAD; AAOx3  CV:  +S1/S2; RRR; 0 m/r/g  Pulm:  Lungs CTAB; 0 r/r/w; symmetrical chest wall expansion  GI:  +BSx4; soft; NTND; 0 rebound/guarding; 0 TTP; 0 CVA tenderness to percussion  Psych:  Cooperative; appropriate mood & affect    Ileus (RESOLVED):  Pt medically stable for D/C home.  F/U w/ PCP in 5-7d.  F/U w/ GenSurg in 2-4 wks.  All questions answered.  Pt agrees to plan of care & plan to D/C.--MF

## 2022-07-24 NOTE — Care Management Notes (Signed)
[facility] Management Initial Evaluation    Patient Name: Steven Henderson  Date of Birth: 11-03-1950  Sex: male  Date/Time of Admission: 07/23/2022  7:53 AM  Room/Bed: 361/A  Payor: MEDICARE / Plan: MEDICARE PART A AND B / Product Type: Medicare /   Primary Care Providers:  Gwenevere Ghazi, DO (General)    Pharmacy Info:   Preferred Pharmacy       [place], [address]    [address] [address] [place] [address]    Phone: [phone] Fax: [phone]    Hours: Not open 24 hours          Emergency Contact Info:   Extended Emergency Contact Information  Primary Emergency Contact: [place], [place] Phone: [phone]  Relation: Wife    History:   Steven Henderson is a 72 y.o., male, admitted 07/23/2022    Height/Weight: 175.3 cm ('5\' 9"'$ ) / 91.9 kg (202 lb 9 oz)     LOS: 1 day   Admitting Diagnosis: Ileus (CMS [place]) [K56.7]    Assessment:      07/24/22 0820   Assessment Details   Assessment Type Admission   Date of Care Management Update 07/24/22   Readmission   Is this a readmission? No   Insurance Information/Type   Insurance type Medicare   Employment/Financial   Patient has Prescription Coverage?  Yes        Name of Insurance Coverage for Medications Medicare and [place] an age group to open "lives with" row.  Adult   Lives With spouse   Living Arrangements mobile home   Able to Return to Prior Arrangements yes   Living Arrangement Comments Lives with wife, [place] Assessment: No Problems Identified   Home Accessibility no concerns;bed and bath on same level;stairs (1 railing present);stairs to enter home   Home Safety Comments No safety concerns voiced by patient   Custody and Legal Status   Do you have a court appointed guardian/conservator? No   Are you an emancipated minor? No   Custody Issues? No   Paternity Affidavit Requested? No   Care Management Plan   Discharge plan discussed  with: Patient   Discharge Needs Assessment   Equipment Currently Used at Home none   Equipment Needed After Discharge none   Discharge Facility/Level of Care Needs Home (Patient/Family Member/other)(code 1)   Transportation Available car;family or friend will provide   Referral Information   Admission Type inpatient   Arrived From home or self-care         Discharge Plan:  Home (Patient/Family Member/other) (code 1)    On contact this am, patient is alert and oriented X 3.  Patient reports living at physical address [address] with wife, Steven Henderson. Tel: [phone]. Patient reports living in [facility] with 3 stairs to enter/exit with Bed/Bath on same level.  Patient denies safety concerns with discharge to home.   Patient denies using AD for ambulation, denies use of home Oxygen Therapy and denies Home Health/Formal Supports prior to admission.   Patient reports ability to manage his self care without assistance prior to admission.   Patient obtains Rx meds at [facility] and primary healthcare with Belva Crome.    Patient denies barriers to healthcare, pharmacy and/or transportation access. Patient self transports.   Patient denies  DC needs at this time.    Voiced plan to discharge home when clinically stable for discharge  The patient will continue to be evaluated for developing discharge needs.     DC Plan to home with family.      Case Manager: Freddrick March, MSW  Phone: [phone]

## 2022-07-27 ENCOUNTER — Telehealth (INDEPENDENT_AMBULATORY_CARE_PROVIDER_SITE_OTHER): Payer: Self-pay | Admitting: Internal Medicine

## 2022-07-27 NOTE — Telephone Encounter (Signed)
Wife Izora Gala called his sugar is high and they can't find the metformin script and want to know if he is still suppose to take that or if Dr took him off of it.  Thanks [employer]

## 2022-07-27 NOTE — Telephone Encounter (Signed)
Spoke with patients wife transitional care note completed ks,lpn

## 2022-07-27 NOTE — Telephone Encounter (Signed)
Yes 1000 mg BID

## 2022-07-27 NOTE — Telephone Encounter (Signed)
Left message to return call regarding medication ks,lpn

## 2022-07-27 NOTE — Telephone Encounter (Signed)
PT DISCHARGED 07-24-2022 COMIGN 08-05-22 Steven Henderson RE 07-27-2022

## 2022-07-28 ENCOUNTER — Other Ambulatory Visit (INDEPENDENT_AMBULATORY_CARE_PROVIDER_SITE_OTHER): Payer: Self-pay | Admitting: Internal Medicine

## 2022-07-28 DIAGNOSIS — E1142 Type 2 diabetes mellitus with diabetic polyneuropathy: Secondary | ICD-10-CM

## 2022-07-28 LAB — ADULT ROUTINE BLOOD CULTURE, SET OF 2 BOTTLES (BACTERIA AND YEAST)
BLOOD CULTURE, ROUTINE: NO GROWTH
BLOOD CULTURE, ROUTINE: NO GROWTH

## 2022-07-28 MED ORDER — METFORMIN 1,000 MG TABLET
1000.00 mg | ORAL_TABLET | Freq: Two times a day (BID) | ORAL | 4 refills | Status: AC
Start: 2022-07-28 — End: ?

## 2022-07-28 NOTE — Telephone Encounter (Signed)
Patient notified to restart metformin 1000 mg twice daily he voiced understanding ks,lpn

## 2022-07-29 ENCOUNTER — Other Ambulatory Visit: Payer: Self-pay

## 2022-07-29 ENCOUNTER — Other Ambulatory Visit: Payer: Medicare Other | Attending: MEDICAL ONCOLOGY

## 2022-07-29 LAB — IRON TRANSFERRIN AND TIBC
IRON (TRANSFERRIN) SATURATION: 42 % (ref 20–50)
IRON: 150 ug/dL (ref 50–212)
TOTAL IRON BINDING CAPACITY: 353 ug/dL (ref 250–450)
TRANSFERRIN: 252 mg/dL (ref 203–362)
UIBC: 203 ug/dL (ref 130–375)

## 2022-07-29 LAB — FERRITIN: FERRITIN: 38 ng/mL (ref 11–336)

## 2022-07-30 ENCOUNTER — Other Ambulatory Visit: Payer: Self-pay

## 2022-07-30 ENCOUNTER — Emergency Department
Admission: EM | Admit: 2022-07-30 | Discharge: 2022-07-30 | Disposition: A | Discharge status: Home / Self Care | Payer: Medicare Other | Attending: Student in an Organized Health Care Education/Training Program | Admitting: Student in an Organized Health Care Education/Training Program

## 2022-07-30 ENCOUNTER — Encounter (HOSPITAL_COMMUNITY): Payer: Self-pay | Admitting: Student in an Organized Health Care Education/Training Program

## 2022-07-30 ENCOUNTER — Emergency Department (HOSPITAL_COMMUNITY): Payer: Medicare Other

## 2022-07-30 DIAGNOSIS — K56609 Unspecified intestinal obstruction, unspecified as to partial versus complete obstruction: Secondary | ICD-10-CM

## 2022-07-30 DIAGNOSIS — R112 Nausea with vomiting, unspecified: Secondary | ICD-10-CM | POA: Insufficient documentation

## 2022-07-30 DIAGNOSIS — E119 Type 2 diabetes mellitus without complications: Secondary | ICD-10-CM

## 2022-07-30 DIAGNOSIS — Z7984 Long term (current) use of oral hypoglycemic drugs: Secondary | ICD-10-CM

## 2022-07-30 DIAGNOSIS — Z1152 Encounter for screening for COVID-19: Secondary | ICD-10-CM | POA: Insufficient documentation

## 2022-07-30 LAB — LIPASE: LIPASE: 71 U/L (ref 11–82)

## 2022-07-30 LAB — COMPREHENSIVE METABOLIC PANEL, NON-FASTING
ALBUMIN/GLOBULIN RATIO: 1.7 — ABNORMAL HIGH (ref 0.8–1.4)
ALBUMIN: 4.3 g/dL (ref 3.5–5.7)
ALKALINE PHOSPHATASE: 40 U/L (ref 34–104)
ALT (SGPT): 43 U/L (ref 7–52)
ANION GAP: 12 mmol/L (ref 4–13)
AST (SGOT): 25 U/L (ref 13–39)
BILIRUBIN TOTAL: 0.7 mg/dL (ref 0.3–1.2)
BUN/CREA RATIO: 19 (ref 6–22)
BUN: 25 mg/dL (ref 7–25)
CALCIUM, CORRECTED: 9.6 mg/dL (ref 8.9–10.8)
CALCIUM: 9.8 mg/dL (ref 8.6–10.3)
CHLORIDE: 102 mmol/L (ref 98–107)
CO2 TOTAL: 20 mmol/L — ABNORMAL LOW (ref 21–31)
CREATININE: 1.3 mg/dL (ref 0.60–1.30)
ESTIMATED GFR: 59 mL/min/{1.73_m2} — ABNORMAL LOW (ref >59)
GLOBULIN: 2.6 — ABNORMAL LOW (ref 2.9–5.4)
GLUCOSE: 204 mg/dL — ABNORMAL HIGH (ref 74–109)
OSMOLALITY, CALCULATED: 279 mOsm/kg (ref 270–290)
POTASSIUM: 4.3 mmol/L (ref 3.5–5.1)
PROTEIN TOTAL: 6.9 g/dL (ref 6.4–8.9)
SODIUM: 134 mmol/L — ABNORMAL LOW (ref 136–145)

## 2022-07-30 LAB — URINALYSIS, MACROSCOPIC
BILIRUBIN: NEGATIVE mg/dL
BLOOD: NEGATIVE mg/dL
GLUCOSE: NEGATIVE mg/dL
LEUKOCYTES: NEGATIVE WBCs/uL
NITRITE: NEGATIVE
PH: 5 (ref 5.0–9.0)
PROTEIN: 30 mg/dL — AB
SPECIFIC GRAVITY: 1.029 (ref 1.002–1.030)
UROBILINOGEN: NORMAL mg/dL

## 2022-07-30 LAB — CBC WITH DIFF
BASOPHIL #: 0 10*3/uL (ref 0.00–0.10)
BASOPHIL %: 0 % (ref 0–1)
EOSINOPHIL #: 0 10*3/uL (ref 0.00–0.50)
EOSINOPHIL %: 0 % — ABNORMAL LOW
HCT: 44.3 % (ref 36.7–47.1)
HGB: 15.2 g/dL (ref 12.5–16.3)
LYMPHOCYTE #: 0.9 10*3/uL — ABNORMAL LOW (ref 1.00–3.00)
LYMPHOCYTE %: 8 % — ABNORMAL LOW (ref 16–44)
MCH: 31.8 pg (ref 23.8–33.4)
MCHC: 34.4 g/dL (ref 32.5–36.3)
MCV: 92.4 fL (ref 73.0–96.2)
MONOCYTE #: 0.7 10*3/uL (ref 0.30–1.00)
MONOCYTE %: 6 % (ref 5–13)
MPV: 9.6 fL (ref 7.4–11.4)
NEUTROPHIL #: 10.4 10*3/uL — ABNORMAL HIGH (ref 1.85–7.80)
NEUTROPHIL %: 86 % — ABNORMAL HIGH (ref 43–77)
PLATELETS: 197 10*3/uL (ref 140–440)
RBC: 4.79 10*6/uL (ref 4.06–5.63)
RDW: 13.5 % (ref 12.1–16.2)
WBC: 12.1 10*3/uL — ABNORMAL HIGH (ref 3.6–10.2)

## 2022-07-30 LAB — COVID-19, FLU A/B, RSV RAPID BY PCR
INFLUENZA VIRUS TYPE A: NOT DETECTED
INFLUENZA VIRUS TYPE B: NOT DETECTED
RESPIRATORY SYNCTIAL VIRUS (RSV): NOT DETECTED
SARS-CoV-2: NOT DETECTED

## 2022-07-30 LAB — URINALYSIS, MICROSCOPIC
HYALINE CASTS: 13 /lpf — ABNORMAL HIGH (ref <0)
RBCS: 2 /hpf (ref <4)
SQUAMOUS EPITHELIAL: 1 /hpf (ref <28)
WBCS: 2 /hpf (ref <6)

## 2022-07-30 LAB — GRAY TOP TUBE

## 2022-07-30 LAB — RAPID THROAT SCREEN, STREPTOCOCCUS, WITH REFLEX: THROAT RAPID SCREEN, STREPTOCOCCUS: NEGATIVE

## 2022-07-30 LAB — BLUE TOP TUBE

## 2022-07-30 MED ORDER — ONDANSETRON 4 MG DISINTEGRATING TABLET
4.0000 mg | ORAL_TABLET | Freq: Three times a day (TID) | ORAL | 0 refills | Status: DC | PRN
Start: 2022-07-30 — End: 2022-08-05

## 2022-07-30 NOTE — ED Triage Notes (Signed)
C/O SORE THROAT, BODY ACHES, NV. WAS D/C'D LAST FRIDAY FROM HERE FOR POSSIBLE BOWEL OBSTRUCTION. X 2 DAYS.

## 2022-07-30 NOTE — ED Provider Notes (Signed)
[facility]  ED Primary Provider Note  History of Present Illness   Chief Complaint   Patient presents with    Nausea     Steven Henderson is a 72 y.o. male who had concerns including Nausea.  Arrival: The patient arrived by Car    Patient is a 72 year old male arrives today history of hypertension diabetes complaining of nausea vomiting suprapubic pain.  Patient reports he was recently discharged from this hospital on the 8th was diagnosed with ileus and partial bowel obstruction.  Patient reports at that time he came in the emergency department due to diarrhea.  Patient does report that he was passing gas as well as having bowel movements.  Patient reports his last bowel movement was this morning and states it was soft.  Patient states he was the office metformin but stated he started taking it a day and a half ago.  Patient endorses normal p.o. intake to liquids and solids.  Furthermore patient reports "I just feel weak".  Denies fever, chills, headache, sick exposures, chest pain, shortness of breath, constipation, diarrhea.      History Reviewed This Encounter: Medical History  Surgical History  Family History  Social History    Physical Exam   ED Triage Vitals [07/30/22 1123]   BP (Non-Invasive) 132/70   Heart Rate 99   Respiratory Rate 18   Temperature 36.5 C (97.7 F)   SpO2 97 %   Weight 90.7 kg (200 lb)   Height 1.727 m (5\' 8" )     Physical Exam  Vitals and nursing note reviewed.   Constitutional:       Appearance: Normal appearance.   HENT:      Head: Normocephalic and atraumatic.   Cardiovascular:      Rate and Rhythm: Normal rate and regular rhythm.   Pulmonary:      Effort: Pulmonary effort is normal.      Breath sounds: Normal breath sounds.   Abdominal:      General: Abdomen is flat. Bowel sounds are normal. There is no distension.      Palpations: Abdomen is soft. There is no mass.      Tenderness: There is abdominal tenderness in the suprapubic area. There is no  guarding or rebound.      Hernia: No hernia is present.   Skin:     General: Skin is warm.      Capillary Refill: Capillary refill takes less than 2 seconds.   Neurological:      General: No focal deficit present.      Mental Status: He is alert and oriented to person, place, and time. Mental status is at baseline.   Psychiatric:         Mood and Affect: Mood normal.         Behavior: Behavior normal.         Thought Content: Thought content normal.         Judgment: Judgment normal.       Patient Data     Labs Ordered/Reviewed   COMPREHENSIVE METABOLIC PANEL, NON-FASTING - Abnormal; Notable for the following components:       Result Value    SODIUM 134 (*)     CO2 TOTAL 20 (*)     ESTIMATED GFR 59 (*)     GLUCOSE 204 (*)     ALBUMIN/GLOBULIN RATIO 1.7 (*)     GLOBULIN 2.6 (*)     All other components within normal  limits    Narrative:     Estimated Glomerular Filtration Rate (eGFR) is calculated using the CKD-EPI (2021) equation, intended for patients 23 years of age and older. If gender is not documented or "unknown", there will be no eGFR calculation.     CBC WITH DIFF - Abnormal; Notable for the following components:    WBC 12.1 (*)     NEUTROPHIL % 86 (*)     LYMPHOCYTE % 8 (*)     EOSINOPHIL % 0 (*)     NEUTROPHIL # 10.40 (*)     LYMPHOCYTE # 0.90 (*)     All other components within normal limits   URINALYSIS, MACROSCOPIC - Abnormal; Notable for the following components:    PROTEIN 30 (*)     All other components within normal limits   URINALYSIS, MICROSCOPIC - Abnormal; Notable for the following components:    MUCOUS Occasional (*)     HYALINE CASTS 13 (*)     All other components within normal limits   RAPID THROAT SCREEN, STREPTOCOCCUS, WITH REFLEX - Normal    Narrative:     Walk-Away Mode   COVID-19, FLU A/B, RSV RAPID BY PCR - Normal    Narrative:     Results are for the simultaneous qualitative identification of SARS-CoV-2 (formerly 2019-nCoV), Influenza A, Influenza B, and RSV RNA. These etiologic  agents are generally detectable in nasopharyngeal and nasal swabs during the ACUTE PHASE of infection. Hence, this test is intended to be performed on respiratory specimens collected from individuals with signs and symptoms of upper respiratory tract infection who meet Centers for Disease Control and Prevention (CDC) clinical and/or epidemiological criteria for Coronavirus Disease 2019 (COVID-19) testing. CDC COVID-19 criteria for testing on human specimens is available at [facility] webpage information for Healthcare Professionals: Coronavirus Disease 2019 (COVID-19) ([employer]).     False-negative results may occur if the virus has genomic mutations, insertions, deletions, or rearrangements or if performed very early in the course of illness. Otherwise, negative results indicate virus specific RNA targets are not detected, however negative results do not preclude SARS-CoV-2 infection/COVID-19, Influenza, or Respiratory syncytial virus infection. Results should not be used as the sole basis for patient management decisions. Negative results must be combined with clinical observations, patient history, and epidemiological information. If upper respiratory tract infection is still suspected based on exposure history together with other clinical findings, re-testing should be considered.    Disclaimer:   This assay has been authorized by FDA under an Emergency Use Authorization for use in laboratories certified under the Clinical Laboratory Improvement Amendments of 1988 (CLIA), 42 U.S.C. [phone], to perform high complexity tests. The impacts of vaccines, antiviral therapeutics, antibiotics, chemotherapeutic or immunosuppressant drugs have not been evaluated.     Test methodology:   Cepheid Xpert Xpress SARS-CoV-2/Flu/RSV Assay real-time polymerase chain reaction (RT-PCR) test on the GeneXpert Dx and Xpert Xpress systems.   LIPASE - Normal   THROAT CULTURE, BETA HEMOLYTIC  STREPTOCOCCUS   CBC/DIFF    Narrative:     The following orders were created for panel order CBC/DIFF.  Procedure                               Abnormality         Status                     ---------                               -----------         ------  CBC WITH DIFF[[id number]]                Abnormal            Final result                 Please view results for these tests on the individual orders.   EXTRA TUBES    Narrative:     The following orders were created for panel order EXTRA TUBES.  Procedure                               Abnormality         Status                     ---------                               -----------         ------                     BLUE TOP [id number]                                    Final result               GRAY TOP [id number]                                    Final result                 Please view results for these tests on the individual orders.   BLUE TOP TUBE   GRAY TOP TUBE   URINALYSIS, MACROSCOPIC AND MICROSCOPIC W/CULTURE REFLEX    Narrative:     The following orders were created for panel order URINALYSIS, MACROSCOPIC AND MICROSCOPIC W/CULTURE REFLEX [PRN ONLY].  Procedure                               Abnormality         Status                     ---------                               -----------         ------                     URINALYSIS, MACROSCOPIC[[id number]]      Abnormal            Final result               URINALYSIS, MICROSCOPIC[[id number]]      Abnormal            Final result                 Please view results for these tests on the individual orders.     XR KUB AND UPRIGHT ABDOMEN   Final Result by Edi, Radresults In (03/14 1348)   NO ACUTE FINDINGS  Radiologist location ID: [place] Making        Medical Decision Making  KUB upright abdomen shows no acute findings.  Physical exam shows mild suprapubic tenderness normal bowel sounds.  Patient endorses he was passing gas has  had recent bowel movements.  COVID flu RSV normal.  Patient does have slight leukocytosis.  Lipase within normal limits.  Strep within normal limits.  Urinalysis negative for urinary tract infection.  Feeling better at this time.  Patient was given Zofran upon discharge.  Patient advised strict ED return precautions.  All questions and concerns addressed.    Amount and/or Complexity of Data Reviewed  Radiology: ordered.    Risk  Prescription drug management.                Clinical Impression   Nausea & vomiting (Primary)       Disposition: Discharged

## 2022-07-30 NOTE — Discharge Instructions (Signed)
Follow up with your general surgeon Dr. Venia Minks at your previously scheduled appointments.  Adhere to a bland diet consisting of bananas rice apples toast.  Take Zofran as needed for nausea and vomiting take 30 minutes prior to trying to consume meals.  Advance diet as tolerated.  If you have decreased bowel movements, decreased passing of gas or for any other symptoms arise or concerns you return emergency department.    Thank you for allowing [place] to be part of your care.    Please discuss all medications with your pharmacist to ensure there are no concerns of interactions.    Please ensure all questions or concerns are addressed prior to leaving the hospital. We want to make sure your concerns are addressed to make sure you are as safe and healthy as possible. By leaving the hospital, it is understood you are in agreement with your treatment plan.    You may have received sedating medication during your visit. Please discuss this with your discharging provider nurse as you may not be able to operate machines while the medication is in your system, or while you are taking any potentially sedating prescriptions.    Please call the hospital medical records office for a copy of your finalized results, and review them with a primary care physician, for any findings needing further attention.    If you feel your situation worsens, or does not get better in 48 hours, please see a physician for evaluation.    We encourage you to see your regular doctor as soon as possible to let them know you were seen in the emergency department. They may want to do further testing. If you do not have a doctor, please feel free to call the hospital, and ask for contact information of accepting providers. Please also discuss your vaccinations, and ensure all are up to date.    You may use this document to take today off work or school.

## 2022-07-30 NOTE — ED Nurses Note (Signed)
Patient discharged home with family.  AVS reviewed with patient/care giver.  A written copy of the AVS and discharge instructions was given to the patient/care giver.  Questions sufficiently answered as needed.  Patient/care giver encouraged to follow up with PCP as indicated.  In the event of an emergency, patient/care giver instructed to call 911 or go to the nearest emergency room.

## 2022-07-30 NOTE — ED APP Handoff Note (Signed)
[facility]  Emergency Department  Provider in Triage Note    Name: Steven Henderson  Age: 72 y.o.  Gender: male     Subjective:   Steven Henderson is a 72 y.o. male who presents with complaint of Nausea  .  Patient presents to ED today for nausea vomiting abdominal pain that started yesterday.  Patient was discharged from our facility on the 8th for a ileus and small-bowel obstruction.  Patient was passing gas.    Objective:   There were no vitals filed for this visit.   Vitals are also documented in the EMR.  Focused Physical Exam shows no acute distress    Assessment:  A medical screening exam was completed.  This patient is a 73 y.o. male with Nausea  .    Plan:  Please see initial orders and work-up in the EMR.  This is to be continued with full evaluation in the main Emergency Department.     No current facility-administered medications for this encounter.     Results for orders placed or performed during the hospital encounter of 07/30/22 (from the past 24 hour(s))   CBC/DIFF    Narrative    The following orders were created for panel order CBC/DIFF.  Procedure                               Abnormality         Status                     ---------                               -----------         ------                     CBC WITH [id number]                                                                 Please view results for these tests on the individual orders.   Results for orders placed or performed in visit on 07/29/22 (from the past 24 hour(s))   FERRITIN   Result Value Ref Range    FERRITIN 38 11 - 336 ng/mL   IRON TRANSFERRIN AND TIBC   Result Value Ref Range    TOTAL IRON BINDING CAPACITY 353 250 - 450 ug/dL    IRON (TRANSFERRIN) SATURATION 42 20 - 50 %    IRON 150 50 - 212 ug/dL    TRANSFERRIN 252 203 - 362 mg/dL    UIBC 203 130 - 375 ug/dL          /P. Valere Dross FNP-C   07/30/2022, 11:23   Department of Emergency Medicine  [facility]

## 2022-08-01 LAB — THROAT CULTURE, BETA HEMOLYTIC STREPTOCOCCUS: THROAT CULTURE: NORMAL

## 2022-08-05 ENCOUNTER — Ambulatory Visit (INDEPENDENT_AMBULATORY_CARE_PROVIDER_SITE_OTHER): Payer: Medicare Other | Admitting: Internal Medicine

## 2022-08-05 ENCOUNTER — Other Ambulatory Visit: Payer: Self-pay

## 2022-08-05 ENCOUNTER — Encounter (INDEPENDENT_AMBULATORY_CARE_PROVIDER_SITE_OTHER): Payer: Self-pay | Admitting: Internal Medicine

## 2022-08-05 VITALS — BP 147/71 | HR 82 | Resp 16 | Ht 68.0 in | Wt 205.0 lb

## 2022-08-05 DIAGNOSIS — E1142 Type 2 diabetes mellitus with diabetic polyneuropathy: Secondary | ICD-10-CM

## 2022-08-05 DIAGNOSIS — M546 Pain in thoracic spine: Secondary | ICD-10-CM

## 2022-08-05 DIAGNOSIS — K567 Ileus, unspecified: Secondary | ICD-10-CM

## 2022-08-05 DIAGNOSIS — G8929 Other chronic pain: Secondary | ICD-10-CM

## 2022-08-05 DIAGNOSIS — K56600 Partial intestinal obstruction, unspecified as to cause: Secondary | ICD-10-CM

## 2022-08-05 DIAGNOSIS — E1159 Type 2 diabetes mellitus with other circulatory complications: Secondary | ICD-10-CM

## 2022-08-05 DIAGNOSIS — R5382 Chronic fatigue, unspecified: Secondary | ICD-10-CM

## 2022-08-05 DIAGNOSIS — I152 Hypertension secondary to endocrine disorders: Secondary | ICD-10-CM

## 2022-08-05 DIAGNOSIS — R112 Nausea with vomiting, unspecified: Secondary | ICD-10-CM

## 2022-08-05 DIAGNOSIS — Z09 Encounter for follow-up examination after completed treatment for conditions other than malignant neoplasm: Secondary | ICD-10-CM

## 2022-08-05 NOTE — Progress Notes (Signed)
INTERNAL MEDICINE, [facility]  [address]  [place] [address]    Transitional Care Management Note    Name: Steven Henderson MRN:  [id number]   Date: 08/05/2022 Age: 72 y.o.     Chief Complaint: Hospital Discharge Transition (States he is still weak and just don't fell good. ) and Hospital Discharge Transition       SUBJECTIVE:  Steven Henderson is a 72 y.o. male presenting today for follow-up after being discharged. The main problem requiring admission was secondary to intractable nausea and vomiting and constipation.  The patient was noted to have a acute ileus and was treated conservatively it throughout his hospital course.  He was then scheduled for outpatient EGD and colonoscopy to be performed and this is going to be performed within the next week.  The patient states he is still just has not felt well.  He states he is having increasing amounts of just nausea and lethargy and states that he is denying any dysuria.  He is denying any hematochezia or melena and he states that over the past 4-5 days he has had a normal bowel movement and before that he had been having some problems with constipation with his ileus.  Other than this he is having no chest pain or chest pressure he has having no headaches.  He has having no other GI complaints and no significant abdominal pain associated with this.        OBJECTIVE:   BP (!) 147/71 (Site: Left, Patient Position: Sitting, Cuff Size: Adult)   Pulse 82   Resp 16   Ht 1.727 m (5\' 8" )   Wt 93 kg (205 lb)   SpO2 96%   BMI 31.17 kg/m          Transition of Care Contact Information  Discharge date: Discharge Date: 07/24/2022  Transition Facility Type--Hospital (Inpatient or Observation)  [place]):  Completed Contact: 07/27/2022 11:43 AM  Contact Method(s)-- Patient/Caregiver Telephone  Clinical Staff Name/Role who contacted--Kim Sexton LPN     Data Reviewed  Medication Reconciliation  completed    Assessment and Plan    ICD-10-CM    1. Hospital discharge follow-up  Z09 Exam performed    Medical complexity associated with this exam is high      2. Hypomagnesemia  E83.42 Labs ordered here and on this visit will be be addressed by me. I will contact patient and address therapy options once completed.     MAGNESIUM     PHOSPHORUS      3. Partial small bowel obstruction (CMS HCC)  K56.600 The cause of his bowel obstruction is undetermined at present.  I am going to order the below stated labs in addition to having his colonoscopy to be performed.  I have told him that if this does not reveal any source of his issue then he may need to have an enteroscopy to be performed and we will discuss this depending upon the results with: An EGD    THYROID STIMULATING HORMONE (SENSITIVE TSH)     MAGNESIUM     SEDIMENTATION RATE     C-REACTIVE PROTEIN(CRP),INFLAMMATION      4. 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 I will contact patient and address therapy options once completed.     HGA1C (HEMOGLOBIN A1C WITH EST AVG GLUCOSE)  5. Nausea and vomiting, unspecified vomiting type  R11.2 The patient has had improvements in their symptoms with current treatment plans as described above.  At this point after a lengthy discussion the patient feels that they are stable to a reasonable extent and no further treatments at this point are required.  We will obviously re-evaluate this on their next visit and I have informed them that if there is any worsening in their symptoms to call the office early for further treatment options.       6. Ileus (CMS HCC)  K56.7 Recurrent with an undetermined etiology present.  There have been no changes in his medications he is denied any decrease in activity level.    PHOSPHORUS      7. Hypertension associated with type 2 diabetes mellitus (CMS HCC)    E11.59 Patients Blood Pressure is elevated today and recommend that they return in 1 week for recheck. They understand and will have rechecked.         CBC    I15.2 Labs ordered here and on this visit will be be addressed by me. I will contact patient and address therapy options once completed.     COMPREHENSIVE METABOLIC PANEL, NON-FASTING     URINALYSIS WITH REFLEX MICROSCOPIC AND CULTURE IF POSITIVE      8. Chronic fatigue  R53.82 Labs ordered here and on this visit will be be addressed by me. I will contact patient and address therapy options once completed.     THYROID STIMULATING HORMONE (SENSITIVE TSH)     PHOSPHORUS        Other transition actions (Optional) -: Discharge documentation was reviewed, Discharge documentation used to reconcile outpatient medication list, and Pending tests or treatments were discussed with the patient-family-caregiver     Return in about 6 weeks (around 09/16/2022).    Steven Elm, DO

## 2022-08-06 ENCOUNTER — Other Ambulatory Visit (HOSPITAL_COMMUNITY): Payer: Medicare Other

## 2022-08-06 ENCOUNTER — Inpatient Hospital Stay
Admission: RE | Admit: 2022-08-06 | Discharge: 2022-08-06 | Disposition: A | Discharge status: Home / Self Care | Payer: Medicare Other | Source: Ambulatory Visit | Attending: Internal Medicine | Admitting: Internal Medicine

## 2022-08-06 ENCOUNTER — Other Ambulatory Visit (INDEPENDENT_AMBULATORY_CARE_PROVIDER_SITE_OTHER): Payer: Self-pay | Admitting: Internal Medicine

## 2022-08-06 DIAGNOSIS — R5382 Chronic fatigue, unspecified: Secondary | ICD-10-CM

## 2022-08-06 DIAGNOSIS — E1159 Type 2 diabetes mellitus with other circulatory complications: Secondary | ICD-10-CM | POA: Insufficient documentation

## 2022-08-06 DIAGNOSIS — G8929 Other chronic pain: Secondary | ICD-10-CM | POA: Insufficient documentation

## 2022-08-06 DIAGNOSIS — R112 Nausea with vomiting, unspecified: Secondary | ICD-10-CM

## 2022-08-06 DIAGNOSIS — K567 Ileus, unspecified: Secondary | ICD-10-CM | POA: Insufficient documentation

## 2022-08-06 DIAGNOSIS — M546 Pain in thoracic spine: Secondary | ICD-10-CM | POA: Insufficient documentation

## 2022-08-06 DIAGNOSIS — Z09 Encounter for follow-up examination after completed treatment for conditions other than malignant neoplasm: Secondary | ICD-10-CM

## 2022-08-06 DIAGNOSIS — E1142 Type 2 diabetes mellitus with diabetic polyneuropathy: Secondary | ICD-10-CM | POA: Insufficient documentation

## 2022-08-06 DIAGNOSIS — K56600 Partial intestinal obstruction, unspecified as to cause: Secondary | ICD-10-CM | POA: Insufficient documentation

## 2022-08-06 DIAGNOSIS — I152 Hypertension secondary to endocrine disorders: Secondary | ICD-10-CM | POA: Insufficient documentation

## 2022-08-06 LAB — HGA1C (HEMOGLOBIN A1C WITH EST AVG GLUCOSE): HEMOGLOBIN A1C: 8.3 % — ABNORMAL HIGH (ref 4.0–6.0)

## 2022-08-06 LAB — CBC
HCT: 43.5 % (ref 36.7–47.1)
HGB: 15.6 g/dL (ref 12.5–16.3)
MCH: 33.1 pg (ref 23.8–33.4)
MCHC: 35.9 g/dL (ref 32.5–36.3)
MCV: 92.1 fL (ref 73.0–96.2)
MPV: 10.1 fL (ref 7.4–11.4)
PLATELET COMMENT: ADEQUATE
PLATELETS: 115 10*3/uL — ABNORMAL LOW (ref 140–440)
RBC: 4.72 10*6/uL (ref 4.06–5.63)
RDW: 13.4 % (ref 12.1–16.2)
WBC: 7.4 10*3/uL (ref 3.6–10.2)

## 2022-08-06 LAB — URINALYSIS, MACRO/MICRO
BILIRUBIN: NEGATIVE mg/dL
BLOOD: NEGATIVE mg/dL
GLUCOSE: NEGATIVE mg/dL
KETONES: NEGATIVE mg/dL
LEUKOCYTES: NEGATIVE WBCs/uL
NITRITE: NEGATIVE
PH: 5.5 (ref 5.0–9.0)
PROTEIN: 10 mg/dL
SPECIFIC GRAVITY: 1.022 (ref 1.002–1.030)
UROBILINOGEN: NORMAL mg/dL

## 2022-08-06 LAB — COMPREHENSIVE METABOLIC PANEL, NON-FASTING
ALBUMIN/GLOBULIN RATIO: 1.4 (ref 0.8–1.4)
ALBUMIN: 4.2 g/dL (ref 3.5–5.7)
ALKALINE PHOSPHATASE: 39 U/L (ref 34–104)
ALT (SGPT): 35 U/L (ref 7–52)
ANION GAP: 6 mmol/L (ref 4–13)
AST (SGOT): 23 U/L (ref 13–39)
BILIRUBIN TOTAL: 0.6 mg/dL (ref 0.3–1.2)
BUN/CREA RATIO: 17 (ref 6–22)
BUN: 18 mg/dL (ref 7–25)
CALCIUM, CORRECTED: 10.1 mg/dL (ref 8.9–10.8)
CALCIUM: 10.3 mg/dL (ref 8.6–10.3)
CHLORIDE: 105 mmol/L (ref 98–107)
CO2 TOTAL: 27 mmol/L (ref 21–31)
CREATININE: 1.05 mg/dL (ref 0.60–1.30)
ESTIMATED GFR: 76 mL/min/{1.73_m2} (ref >59)
GLOBULIN: 2.9 (ref 2.9–5.4)
GLUCOSE: 124 mg/dL — ABNORMAL HIGH (ref 74–109)
OSMOLALITY, CALCULATED: 279 mOsm/kg (ref 270–290)
POTASSIUM: 4.5 mmol/L (ref 3.5–5.1)
PROTEIN TOTAL: 7.1 g/dL (ref 6.4–8.9)
SODIUM: 138 mmol/L (ref 136–145)

## 2022-08-06 LAB — PHOSPHORUS: PHOSPHORUS: 2.6 mg/dL — ABNORMAL LOW (ref 3.7–7.2)

## 2022-08-06 LAB — THYROID STIMULATING HORMONE (SENSITIVE TSH): TSH: 1.61 u[IU]/mL (ref 0.450–5.330)

## 2022-08-06 LAB — C-REACTIVE PROTEIN(CRP),INFLAMMATION: C-REACTIVE PROTEIN (CRP): 0.5 mg/dL (ref 0.1–0.5)

## 2022-08-06 LAB — SEDIMENTATION RATE: ERYTHROCYTE SEDIMENTATION RATE (ESR): 2 mm/hr (ref <20)

## 2022-08-06 LAB — MAGNESIUM: MAGNESIUM: 1.6 mg/dL — ABNORMAL LOW (ref 1.9–2.7)

## 2022-08-08 NOTE — Progress Notes (Deleted)
GENERAL SURGERY, [address] [place] [address]  [place] [address]    Progress Note    Name: [name] MRN:  [id number]   Date: 08/10/2022 DOB:  [birth year] (72 y.o.)              Date of Birth:  11/14/1950  PCP: Nettie Elm, DO  Referring:  No ref. provider found     HPI:  Steven Henderson is a 72 y.o. White male who returns for follow-up of his recent hospital admission.  At that time  at CT scan findings of an ileus with subsequent complete resolution of all GI symptoms.  Also was noted to have thickening of the lower esophageal wall.        Past Medical History:   Diagnosis Date    Anxiety state     BPH (benign prostatic hyperplasia)     Depression, acute     Diabetes mellitus, type 2 (CMS HCC)     Elevated PSA     Generalized OA     Hemochromatosis     Hyperlipidemia     Hypertension     Hypomagnesemia     Insomnia     Testicular hypofunction     Vitamin D deficiency       Past Surgical History:   Procedure Laterality Date    CYSTOSCOPY  06/23/2017    HX APPENDECTOMY        No outpatient medications have been marked as taking for the 08/10/22 encounter (Appointment) with Shirlee More, MD.      No Known Allergies        There were no vitals taken for this visit.         General: appropriate for age. in no acute distress.    Vital signs are present above and have been reviewed by me     HEENT: Atraumatic, Normocephalic.    Lungs: Nonlabored breathing with symmetric expansion    Heart:Regular wth respect to rate and rythmn.    Abdomen:Soft. Nontender. Nondistended     Psychiatric: Alert and oriented to person, place, and time. affect appropriate       Assessment/Plan:  No diagnosis found.     ***      This note was partially created using voice recognition software and is inherently subject to errors including those of syntax and "sound alike " substitutions which may escape proof reading. In such instances, original meaning may be extrapolated by contextual derivation.    Bridgett Larsson MD MBA CPE FACS

## 2022-08-10 ENCOUNTER — Encounter (INDEPENDENT_AMBULATORY_CARE_PROVIDER_SITE_OTHER): Payer: Self-pay | Admitting: Surgery

## 2022-08-14 ENCOUNTER — Encounter (INDEPENDENT_AMBULATORY_CARE_PROVIDER_SITE_OTHER): Payer: Self-pay | Admitting: Surgery

## 2022-08-14 NOTE — Nursing Note (Signed)
Sent no show letter.  Steven Henderson  08/14/2022 08:25

## 2022-09-04 ENCOUNTER — Telehealth (INDEPENDENT_AMBULATORY_CARE_PROVIDER_SITE_OTHER): Payer: Self-pay | Admitting: Internal Medicine

## 2022-09-07 ENCOUNTER — Encounter (INDEPENDENT_AMBULATORY_CARE_PROVIDER_SITE_OTHER): Payer: Self-pay | Admitting: Internal Medicine

## 2022-09-07 ENCOUNTER — Other Ambulatory Visit: Payer: Self-pay

## 2022-09-07 ENCOUNTER — Ambulatory Visit (INDEPENDENT_AMBULATORY_CARE_PROVIDER_SITE_OTHER): Payer: Medicare Other | Admitting: Internal Medicine

## 2022-09-07 VITALS — BP 118/76 | HR 86 | Resp 16 | Ht 68.0 in | Wt 205.0 lb

## 2022-09-07 DIAGNOSIS — E782 Mixed hyperlipidemia: Secondary | ICD-10-CM | POA: Insufficient documentation

## 2022-09-07 DIAGNOSIS — Z7984 Long term (current) use of oral hypoglycemic drugs: Secondary | ICD-10-CM

## 2022-09-07 DIAGNOSIS — I10 Essential (primary) hypertension: Secondary | ICD-10-CM | POA: Insufficient documentation

## 2022-09-07 DIAGNOSIS — E1159 Type 2 diabetes mellitus with other circulatory complications: Secondary | ICD-10-CM

## 2022-09-07 DIAGNOSIS — M4810 Ankylosing hyperostosis [Forestier], site unspecified: Secondary | ICD-10-CM

## 2022-09-07 DIAGNOSIS — R338 Other retention of urine: Secondary | ICD-10-CM

## 2022-09-07 DIAGNOSIS — R5382 Chronic fatigue, unspecified: Secondary | ICD-10-CM

## 2022-09-07 DIAGNOSIS — I152 Hypertension secondary to endocrine disorders: Secondary | ICD-10-CM

## 2022-09-07 DIAGNOSIS — N401 Enlarged prostate with lower urinary tract symptoms: Secondary | ICD-10-CM

## 2022-09-07 DIAGNOSIS — Z1211 Encounter for screening for malignant neoplasm of colon: Secondary | ICD-10-CM | POA: Insufficient documentation

## 2022-09-07 DIAGNOSIS — E1142 Type 2 diabetes mellitus with diabetic polyneuropathy: Secondary | ICD-10-CM

## 2022-09-07 DIAGNOSIS — Z0000 Encounter for general adult medical examination without abnormal findings: Secondary | ICD-10-CM

## 2022-09-07 MED ORDER — HYDROCHLOROTHIAZIDE 25 MG TABLET
25.0000 mg | ORAL_TABLET | Freq: Every day | ORAL | 3 refills | Status: DC
Start: 2022-09-07 — End: 2023-02-12

## 2022-09-07 MED ORDER — TELMISARTAN 80 MG TABLET
80.0000 mg | ORAL_TABLET | Freq: Every day | ORAL | 3 refills | Status: DC
Start: 2022-09-07 — End: 2023-02-12

## 2022-09-07 MED ORDER — ROSUVASTATIN 10 MG TABLET
10.0000 mg | ORAL_TABLET | Freq: Every evening | ORAL | 3 refills | Status: DC
Start: 2022-09-07 — End: 2023-07-22

## 2022-09-07 NOTE — Patient Instructions (Signed)
Medicare Preventive Services  Medicare coverage information Recommendation for YOU   Heart Disease and Diabetes   Lipid profile every 5 years or more often if at risk for cardiovascular disease  Lab Results   Component Value Date    CHOLESTEROL 181 10/14/2021    HDLCHOL 44 10/14/2021    LDLCHOL 98 10/14/2021    LDLCHOLDIR 24 09/17/2020    TRIG 194 (H) 10/14/2021       Diabetes Screening with Blood Glucose test or Glucose Tolerance Test Yearly for those at risk for diabetes, up to two tests per year for those with prediabetes  Last Glucose: 124     Diabetes Self-Management Training   Initial training ten hours per year, and follow-up training two hours per subsequent year. Optional for those with diabetes    Medical Nutrition Therapy   Three hours of one-on-one counseling in first year, two hours in subsequent years. Optional for those with diabetes, kidney disease   Intensive Behavioral Therapy for Obesity  Face-to-face counseling, first month every week, month 2-6 every other week, month 7-12 every month if continued progress is documented Optional for those with Body Mass Index 30 or higher  Your Body mass index is 31.17 kg/m.   Tobacco Cessation (Quitting) Counseling   Two attempts per year, max 4 sessions per attempt, up to 8 sessions per year Optional for those who use tobacco    Cancer Screening Last Completion Date   Colorectal screening   For anyone age 72 to 83 or any age if high risk:  Screening Colonoscopy every 10 yrs if low risk,  more frequent if higher risk  OR  Cologuard Stool DNA test once every 3 years OR  Fecal Occult Blood Testing yearly OR  Flexible  Sigmoidoscopy  every 5 yr OR  CT Colonography every 5 yrs    See below for due date if applicable.   Prostate Cancer Screening  Prostate Specific Antigen blood test based on joint decision making with your provider for ages 88-69  A joint decision between you and your primary care provider   Lung Cancer Screening  Annual low dose computed  tomography (LDCT scan) is recommended for those age 4-80 who smoked 20 pack-years and are current smokers or quit smoking within past 15 years, after counseling by your doctor or nurse clinician about the possible benefits or harms.   See below for due date if applicable.   Vaccinations   Respiratory syncytial virus (RSV)  Age 77 years or older: Based on shared clinical decision-making with your provider.  Pneumococcal Vaccine  Recommended routinely age 72+ with one or two separate vaccines based on your risk. Recommended before age 62 if medical conditions with increased risk  Seasonal Influenza Vaccine  Once every flu season   Hepatitis B Vaccine  3 doses if risk (including anyone with diabetes or liver disease)  Shingles Vaccine  Two doses at age 38 or older  Diphtheria Tetanus Pertussis Vaccine  ONCE as adult, booster every 10 years     Immunization History   Administered Date(s) Administered    Covid-19 Vaccine,Moderna,12 Years+ 06/10/2019, 07/08/2019, 04/10/2020     Shingles vaccine and Diphtheria Tetanus Pertussis vaccines are available at pharmacies or local health department without a prescription.   Other Preventative Screening  Last Completion Date   Glaucoma Screening   Yearly if in high risk group such as diabetes, family history, African American age 28+ or Hispanic American age 72+ See your Eye Care Provider  Hepatitis C Screening   Recommended  for those born between ages 18-79 years.   See below for due date if applicable.     HIV Testing  Recommended routinely at least ONCE, covered every year for age 73 to 24 regardless of risk, and every year for age over 21 who ask for the test or higher risk. Yearly or up to 3 times in pregnancy    See below for due date if applicable.  Bone Densitometry   Screening is recommended for Men ages 67 and above with one or more risk factor: androgen deprivation therapy for prostate cancer, hypogonadism, frailty, primary hyperparathyroidism, hyperthyroidism  For  men diagnosed with osteoporosis, follow up is recommended every years or a frequency recommended by your provider     See below for due date if applicable.   Abdominal Aortic Aneurysm Screening Ultrasound   Once with a family history of abdominal aortic aneurysms OR a male between 68-75 and have smoked at least 100 cigarettes in your lifetime.     See below for due date if applicable.       Your Personalized Schedule for Preventive Tests     Health Maintenance: Pending and Last Completed         Date Due Completion Date    Colonoscopy Never done ---    Diabetic A1C 11/06/2022 08/06/2022    Diabetic Retinal Exam 12/14/2022 ---    Influenza Vaccine (Season Ended) 01/17/2023 ---    Diabetic Kidney Health Microalb/Cr Ratio 02/14/2023 02/13/2022    Diabetic Kidney Health eGFR 08/06/2023 08/06/2022    Medicare Annual Wellness Visit 09/07/2023 09/07/2022                For Information on Advanced Directives for Health Care:  :  [email or web address]  [place], [place], [place], [place] General Information: [email or web address]

## 2022-09-07 NOTE — Progress Notes (Addendum)
[facility]  [address]  [place] [address]       Name: Steven Henderson MRN:  [id number]   Date: 09/07/2022 Age: 72 y.o.       Chief Complaint:    Chief Complaint   Patient presents with    Medicare Annual       HPI:  Steven Henderson is a 72 y.o. male who presents to the office today via follow-up of their diabetic control. Patient is actually doing relatively well and except for minor issues as described below which I have addressed in total with the patient.    Past Medical History:  Past Medical History:   Diagnosis Date    Anxiety state     BPH (benign prostatic hyperplasia)     Depression, acute     Diabetes mellitus, type 2 (CMS HCC)     Elevated PSA     Generalized OA     Hemochromatosis     Hyperlipidemia     Hypertension     Hypomagnesemia     Insomnia     Testicular hypofunction     Vitamin D deficiency          Past Surgical History:   Procedure Laterality Date    CYSTOSCOPY  06/23/2017    HX APPENDECTOMY        Current Outpatient Medications   Medication Sig    albuterol sulfate (PROVENTIL OR VENTOLIN OR PROAIR) 90 mcg/actuation Inhalation oral inhaler Take 1-2 Puffs by inhalation Every 6 hours as needed    amLODIPine (NORVASC) 10 mg Oral Tablet TAKE (1) TABLET DAILY    aspirin (ECOTRIN) 81 mg Oral Tablet, Delayed Release (E.C.) Take 1 Tablet (81 mg total) by mouth Once a day    finasteride (PROSCAR) 5 mg Oral Tablet Take 1 Tablet (5 mg total) by mouth Once a day    glimepiride (AMARYL) 4 mg Oral Tablet Take 1 Tablet (4 mg total) by mouth Twice daily (Patient taking differently: Take 1 Tablet (4 mg total) by mouth Every evening)    hydroCHLOROthiazide (HYDRODIURIL) 25 mg Oral Tablet Take 1 Tablet (25 mg total) by mouth Once a day    meloxicam (MOBIC) 15 mg Oral Tablet TAKE (1) TABLET DAILY WITH FOOD. (Patient taking differently: Take 1 Tablet (15 mg total) by mouth Once a day)    MetFORMIN (GLUCOPHAGE) 1,000 mg Oral Tablet Take 1 Tablet (1,000 mg total) by mouth Twice  daily with food    methocarbamoL (ROBAXIN) 500 mg Oral Tablet TAKE (1) TABLET FOUR TIMES DAILY. (Patient taking differently: Take 1 Tablet (500 mg total) by mouth Four times a day TAKE (1) TABLET FOUR TIMES DAILY.)    pantoprazole (PROTONIX) 40 mg Oral Tablet, Delayed Release (E.C.) Take 1 Tablet (40 mg total) by mouth Once a day for 30 days    rosuvastatin (CRESTOR) 10 mg Oral Tablet Take 1 Tablet (10 mg total) by mouth Every evening    tamsulosin (FLOMAX) 0.4 mg Oral Capsule Take 1 Capsule (0.4 mg total) by mouth Every evening after dinner    telmisartan (MICARDIS) 80 mg Oral Tablet Take 1 Tablet (80 mg total) by mouth Once a day     No Known Allergies    Family History:  Family Medical History:       Problem Relation (Age of Onset)    Liver Cancer Father    Stroke Mother              Social History:  Social History     Tobacco Use   Smoking Status Never   Smokeless Tobacco Never     Social History     Substance and Sexual Activity   Alcohol Use Not Currently     Social History     Occupational History    Not on file       Review of Systems:  Review of systems as discussed in HPI      Problem List:  Patient Active Problem List   Diagnosis    Anxiety state    BPH (benign prostatic hyperplasia)    Depression, acute    Diabetes mellitus, type 2 (CMS HCC)    Elevated PSA    Generalized OA    Hypomagnesemia    Insomnia    Major depression, recurrent (CMS HCC)    Chronic bilateral thoracic back pain    Hypertension associated with type 2 diabetes mellitus (CMS HCC)  (CMS HCC)    Fatigue    Hereditary hemochromatosis (CMS HCC)    Dupuytren's contracture of both hands    Ileus (CMS HCC)    Nausea, vomiting and diarrhea    Abdominal pain    Chronic cough    Partial small bowel obstruction (CMS HCC)    Dehydration    Nausea and vomiting, unspecified vomiting type    Mixed hyperlipidemia    Primary hypertension    Colon cancer screening       Physical Examination:  BP 118/76 (Site: Left, Patient Position: Sitting, Cuff  Size: Adult Large)   Pulse 86   Resp 16   Ht 1.727 m ( )   Wt 93 kg (205 lb)   BMI 31.17 kg/m       Physical Exam  Vitals and nursing note reviewed.   Constitutional:       General: He is not in acute distress.     Appearance: Normal appearance.   HENT:      Head: Normocephalic.      Right Ear: Tympanic membrane normal.      Left Ear: Tympanic membrane normal.      Nose: Nose normal.      Mouth/Throat:      Mouth: Mucous membranes are moist.   Eyes:      General: No scleral icterus.     Extraocular Movements: Extraocular movements intact.      Conjunctiva/sclera: Conjunctivae normal.      Pupils: Pupils are equal, round, and reactive to light.   Neck:      Vascular: No carotid bruit.   Cardiovascular:      Rate and Rhythm: Normal rate and regular rhythm.      Pulses: Normal pulses.           Dorsalis pedis pulses are 2+ on the right side and 2+ on the left side.        Posterior tibial pulses are 2+ on the right side and 2+ on the left side.      Heart sounds: Normal heart sounds.   Pulmonary:      Effort: Pulmonary effort is normal.      Breath sounds: Normal breath sounds. No wheezing, rhonchi or rales.   Abdominal:      General: Abdomen is flat. Bowel sounds are normal.      Tenderness: There is no abdominal tenderness. There is no guarding.   Musculoskeletal:         General: No swelling. Normal range of motion.  Cervical back: Normal range of motion. No tenderness.      Right lower leg: No edema.      Left lower leg: No edema.   Skin:     General: Skin is warm.      Capillary Refill: Capillary refill takes less than 2 seconds.      Findings: Ecchymosis present.   Neurological:      General: No focal deficit present.      Mental Status: He is alert and oriented to person, place, and time.      Cranial Nerves: No cranial nerve deficit.            Health Maintenance:  Health Maintenance   Topic Date Due    Colonoscopy  Never done    Diabetic A1C  11/06/2022    Diabetic Retinal Exam  12/14/2022     Influenza Vaccine (Season Ended) 01/17/2023    Diabetic Kidney Health Microalb/Cr Ratio  02/14/2023    Diabetic Kidney Health eGFR  08/06/2023    Medicare Annual Wellness Visit  09/07/2023    Pneumococcal Vaccination, Age 57+  Completed    Meningococcal Vaccine  Aged Out    Adult Tdap-Td  Discontinued    Hepatitis C screening  Discontinued    Shingles Vaccine  Discontinued    Covid-19 Vaccine  Discontinued    Prostate Cancer Screening  Discontinued        Assessment/Plan:      ICD-10-CM    1. Encounter for Medicare annual wellness exam  Z00.00 Exam Performed      2. 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 I will contact patient and address therapy options once completed.     LIPID PANEL     HGA1C (HEMOGLOBIN A1C WITH EST AVG GLUCOSE)     MICROALBUMIN URINE, RANDOM      3. Mixed hyperlipidemia  E78.2 Labs ordered here and on this visit will be be addressed by me. I will contact patient and address therapy options once completed.     rosuvastatin (CRESTOR) 10 mg Oral Tablet     LIPID PANEL      4. DISH (disseminated idiopathic skeletal hyperostosis)  M48.10 Suspected      5. Primary hypertension  I10 Labs ordered here and on this visit will be be addressed by me. I will contact patient and address therapy options once completed.     rosuvastatin (CRESTOR) 10 mg Oral Tablet     CBC     URINALYSIS, MACROSCOPIC AND MICROSCOPIC W/CULTURE REFLEX     COMPREHENSIVE METABOLIC PANEL, NON-FASTING      6. Colon cancer screening  Z12.11 Referral to GENERAL SURGERY - [place] - DUREMDES, MULLINS, HOPKINS      7. Benign prostatic hyperplasia with urinary retention  N40.1 Labs ordered here and on this visit will be be addressed by me. I will contact patient and address therapy options once completed.     PSA, DIAGNOSTIC    R33.8       8. Hypertension associated with type 2 diabetes mellitus (CMS  HCC)  (CMS HCC)  E11.59 Blood Pressure today is stable and will continue current treatment plans     I15.2       9. Hereditary hemochromatosis (CMS HCC)  E83.110 Labs ordered here and on this visit will be be addressed by me. I will contact patient and  address therapy options once completed.     IRON     IRON TRANSFERRIN AND TIBC     FERRITIN     IRON TRANSFERRIN AND TIBC      10. Chronic fatigue  R53.82 Labs ordered here and on this visit will be be addressed by me. I will contact patient and address therapy options once completed.     THYROID STIMULATING HORMONE WITH FREE T4 REFLEX         Orders Placed This Encounter    CBC    LIPID PANEL    PSA, DIAGNOSTIC    HGA1C (HEMOGLOBIN A1C WITH EST AVG GLUCOSE)    URINALYSIS, MACROSCOPIC AND MICROSCOPIC W/CULTURE REFLEX    MICROALBUMIN URINE, RANDOM    COMPREHENSIVE METABOLIC PANEL, NON-FASTING    THYROID STIMULATING HORMONE WITH FREE T4 REFLEX    IRON    IRON TRANSFERRIN AND TIBC    FERRITIN    IRON TRANSFERRIN AND TIBC    Referral to GENERAL SURGERY - [place] - DUREMDES, MULLINS, HOPKINS    telmisartan (MICARDIS) 80 mg Oral Tablet    rosuvastatin (CRESTOR) 10 mg Oral Tablet    hydroCHLOROthiazide (HYDRODIURIL) 25 mg Oral Tablet           Follow up:  Return in about 3 months (around 12/07/2022).    This note was partially created using voice recognition software and is inherently subject to errors including those of syntax and "sound alike " substitutions which may escape proof reading.  In such instances, original meaning may be extrapolated by contextual derivation.    Lucia Gaskins, DO  09/07/2022, 08:47      [facility]  [address]  [place] [address]    Medicare Annual Wellness Visit    Name: Steven Henderson MRN:  [id number]   Date: 09/07/2022 Age: 72 y.o.       SUBJECTIVE:   Steven Henderson is a 72 y.o. male for presenting for Medicare Wellness exam.   I have reviewed and reconciled the medication list with the patient  today.        09/07/2022     8:19 AM   Comprehensive Health Assessment-Adult   Do you wish to complete this form? Yes   During the past 4 weeks, how would you rate your health in general? Good   During the past 4 weeks, how much difficulty have you had doing your usual activities inside and outside your home because of medical or emotional problems? A little bit of difficulty   During the past 4 weeks, was someone available to help you if you needed and wanted help? Yes, quite a bit   In the past year, how many times have you gone to the emergency department or been admitted to a hospital for a health problem? 2-4 times   Are you generally satisfied with your sleep? No   Do you have enough money to buy things you need in everyday life, such as food, clothing, medicines, and housing? Yes, always   Can you get to places beyond walking distance without help?  (For example, can you drive your own car or travel alone on buses)? Yes   Do you fasten your seatbelt when you are in a car? Yes, usually   Do you exercise 20 minutes 3 or more days per week (such as walking, dancing, biking, mowing grass, swimming)? Yes, some of the time   How often do you eat food that is healthy (  fruits, vegetables, lean meats) instead of unhealthy (sweets, fast food, junk food, fatty foods)? Some of the time   How often do you have trouble taking medicines the eay you are told to take them? I always take them as prescribed   Do you need any help communicating with your doctors and nurses because of vision or hearing problems? No   During the past 12 months, have you experienced confusion or memory loss that is happening more often or is getting worse? No   Do you have one person you think of as your personal doctor (primary care provider or family doctor)? Yes   If you are seeing a Primary Care Provider (PCP) or family doctor. please list their name Dr. Lacey Jensen   Are you now also seeing any specialist physician(s) (such as eye doctor, foot  doctor, skin doctor)? Yes   If you are seeing a specialist for anything such as foot, eye, skin, etc.  please list their name(s) Oncololgy--Eye doctor--   How confident are you that you can control or manage most of your health problems? Somewhat confident         I have reviewed and updated as appropriate the past medical, family and social history. 09/07/2022 as summarized below:  Past Medical History:   Diagnosis Date    Anxiety state     BPH (benign prostatic hyperplasia)     Depression, acute     Diabetes mellitus, type 2 (CMS HCC)     Elevated PSA     Generalized OA     Hemochromatosis     Hyperlipidemia     Hypertension     Hypomagnesemia     Insomnia     Testicular hypofunction     Vitamin D deficiency      Past Surgical History:   Procedure Laterality Date    Cystoscopy  06/23/2017    Hx appendectomy       Current Outpatient Medications   Medication Sig    albuterol sulfate (PROVENTIL OR VENTOLIN OR PROAIR) 90 mcg/actuation Inhalation oral inhaler Take 1-2 Puffs by inhalation Every 6 hours as needed    amLODIPine (NORVASC) 10 mg Oral Tablet TAKE (1) TABLET DAILY    aspirin (ECOTRIN) 81 mg Oral Tablet, Delayed Release (E.C.) Take 1 Tablet (81 mg total) by mouth Once a day    finasteride (PROSCAR) 5 mg Oral Tablet Take 1 Tablet (5 mg total) by mouth Once a day    glimepiride (AMARYL) 4 mg Oral Tablet Take 1 Tablet (4 mg total) by mouth Twice daily (Patient taking differently: Take 1 Tablet (4 mg total) by mouth Every evening)    hydroCHLOROthiazide (HYDRODIURIL) 25 mg Oral Tablet Take 1 Tablet (25 mg total) by mouth Once a day    meloxicam (MOBIC) 15 mg Oral Tablet TAKE (1) TABLET DAILY WITH FOOD. (Patient taking differently: Take 1 Tablet (15 mg total) by mouth Once a day)    MetFORMIN (GLUCOPHAGE) 1,000 mg Oral Tablet Take 1 Tablet (1,000 mg total) by mouth Twice daily with food    methocarbamoL (ROBAXIN) 500 mg Oral Tablet TAKE (1) TABLET FOUR TIMES DAILY. (Patient taking differently: Take 1 Tablet (500 mg  total) by mouth Four times a day TAKE (1) TABLET FOUR TIMES DAILY.)    pantoprazole (PROTONIX) 40 mg Oral Tablet, Delayed Release (E.C.) Take 1 Tablet (40 mg total) by mouth Once a day for 30 days    rosuvastatin (CRESTOR) 10 mg Oral Tablet Take 1 Tablet (10 mg  total) by mouth Every evening    tamsulosin (FLOMAX) 0.4 mg Oral Capsule Take 1 Capsule (0.4 mg total) by mouth Every evening after dinner    telmisartan (MICARDIS) 80 mg Oral Tablet Take 1 Tablet (80 mg total) by mouth Once a day     Family Medical History:       Problem Relation (Age of Onset)    Liver Cancer Father    Stroke Mother            Social History     Socioeconomic History    Marital status: Married   Tobacco Use    Smoking status: Never    Smokeless tobacco: Never   Vaping Use    Vaping status: Never Used   Substance and Sexual Activity    Alcohol use: Not Currently    Drug use: Never    Sexual activity: Not Currently     Social Determinants of Health     Financial Resource Strain: Low Risk  (05/26/2022)    Financial Resource Strain     SDOH Financial: No   Transportation Needs: Low Risk  (05/26/2022)    Transportation Needs     SDOH Transportation: No   Social Connections: Low Risk  (07/23/2022)    Social Connections     SDOH Social Isolation: 5 or more times a week   Intimate Partner Violence: Low Risk  (05/26/2022)    Intimate Partner Violence     SDOH Domestic Violence: No   Housing Stability: Low Risk  (05/26/2022)    Housing Stability     SDOH Housing Situation: I have housing.     SDOH Housing Worry: No   Health Literacy: Low Risk  (07/23/2022)    Health Literacy     SDOH Health Literacy: Never   Employment Status: Low Risk  (05/26/2022)    Employment Status     SDOH Employment: Full-time work         List of Current Health Care Providers   Care Team       PCP       Name Type Specialty Phone Number    Lucia Gaskins, DO Physician [facility] [phone]              Care Team       Name Type Specialty Phone Number    Marvene Staff, RN Registered  Nurse Not available Not available                      Health Maintenance   Topic Date Due    Colonoscopy  Never done    Diabetic A1C  11/06/2022    Diabetic Retinal Exam  12/14/2022    Influenza Vaccine (Season Ended) 01/17/2023    Diabetic Kidney Health Microalb/Cr Ratio  02/14/2023    Diabetic Kidney Health eGFR  08/06/2023    Medicare Annual Wellness Visit  09/07/2023    Pneumococcal Vaccination, Age 59+  Completed    Meningococcal Vaccine  Aged Out    Adult Tdap-Td  Discontinued    Hepatitis C screening  Discontinued    Shingles Vaccine  Discontinued    Covid-19 Vaccine  Discontinued    Prostate Cancer Screening  Discontinued     Medicare Wellness Assessment   Medicare initial or wellness physical in the last year?: Yes  Advance Directives   Does patient have a living will or MPOA: No           Advance directive information given  to the patient today?: Patient Declined      Activities of Daily Living   Do you need help with dressing, bathing, or walking?: No   Do you need help with shopping, housekeeping, medications, or finances?: No   Do you have rugs in hallways, broken steps, or poor lighting?: Yes   Do you have grab bars in your bathroom, non-slip strips in your tub, and hand rails on your stairs?: No   Cognitive Function Screen (1=Yes, 0=No)   What is you age?: Correct   What is the time to the nearest hour?: Correct   What is the year?: Correct   What is the name of this clinic?: Correct   Can the patient recognize two persons (the doctor, the nurse, home help, etc.)?: Correct   What is the date of your birth? (day and month sufficient) : Correct   In what year did World War II end?: Incorrect   Who is the current president of the [place]?: Correct   Count from 20 down to 1?: Correct   What address did I give you earlier?: Correct   Total Score: 9       Fall Risk Screen   Do you feel unsteady when standing or walking?: No  Do you worry about falling?: No  Have you fallen in the past year?: No    Depression Screen     Little interest or pleasure in doing things.: Several Days  Feeling down, depressed, or hopeless: Several Days  PHQ 2 Total: 2     Pain Score   Pain Score:   0 - No pain    Substance Use-Abuse Screening     Tobacco Use     In Past 12 MONTHS, how often have you used any tobacco product (for example, cigarettes, e-cigarettes, cigars, pipes, or smokeless tobacco)?: Never     Alcohol use     In the PAST 12 MONTHS, how often have you had 5 (men)/4 (women) or more drinks containing alcohol in one day?: Never     Prescription Drug Use     In the PAST 12 months, how often have you used any prescription medications just for the feeling, more than prescribed, or that were not prescribed for you? Prescriptions may include: opioids, benzodiazepines, medications for ADHD: Never           Illicit Drug Use   In the PAST 12 MONTHS, how often have you used any drugs, including marijuana, cocaine or crack, heroin, methamphetamine, hallucinogens, ecstasy/MDMA?: Never        Hearing Screen   Have you noticed any hearing difficulties?: Yes  After whispering 9-1-6 how many numbers did the patient repeat correctly?: 3  After whispering 4-7-8 how many numbers did the patient repeat correctly?: 3       Vision Screen             Urine Incontinence Screen   Urinary Incontinence Screen  Do you ever leak urine when you don't want to?: YES           OBJECTIVE:   BP 118/76 (Site: Left, Patient Position: Sitting, Cuff Size: Adult Large)   Pulse 86   Resp 16   Ht 1.727 m (5\' 8" )   Wt 93 kg (205 lb)   BMI 31.17 kg/m        Other appropriate exam:    Health Maintenance Due   Topic Date Due    Colonoscopy  Never done  ASSESSMENT & PLAN:  Problem List Items Addressed This Visit          Cardiovascular System    Hypertension associated with type 2 diabetes mellitus (CMS HCC)  (CMS HCC) (Chronic)    Mixed hyperlipidemia (Chronic)    Relevant Medications    rosuvastatin (CRESTOR) 10 mg Oral Tablet    Other Relevant Orders     LIPID PANEL    Primary hypertension (Chronic)    Relevant Medications    rosuvastatin (CRESTOR) 10 mg Oral Tablet    Other Relevant Orders    CBC    URINALYSIS, MACROSCOPIC AND MICROSCOPIC W/CULTURE REFLEX    COMPREHENSIVE METABOLIC PANEL, NON-FASTING       Endocrine    Diabetes mellitus, type 2 (CMS HCC) (Chronic)    Relevant Orders    LIPID PANEL    HGA1C (HEMOGLOBIN A1C WITH EST AVG GLUCOSE)    MICROALBUMIN URINE, RANDOM    Hereditary hemochromatosis (CMS HCC) (Chronic)    Relevant Orders    IRON    IRON TRANSFERRIN AND TIBC    FERRITIN    IRON TRANSFERRIN AND TIBC       Urology    BPH (benign prostatic hyperplasia) (Chronic)    Relevant Orders    PSA, DIAGNOSTIC       Other    Fatigue (Chronic)    Relevant Orders    THYROID STIMULATING HORMONE WITH FREE T4 REFLEX    Colon cancer screening    Relevant Orders    Referral to GENERAL SURGERY - [place] - DUREMDES, Sabino Gasser, HOPKINS     Other Visit Diagnoses       Encounter for Medicare annual wellness exam    -  Primary    DISH (disseminated idiopathic skeletal hyperostosis)  (Chronic)                Identified Risk Factors/ Recommended Actions         Urinary Incontinence Plan of Care: Lifestyle modifications    Patient declined Advanced Directives information.        Orders Placed This Encounter    CBC    LIPID PANEL    PSA, DIAGNOSTIC    HGA1C (HEMOGLOBIN A1C WITH EST AVG GLUCOSE)    URINALYSIS, MACROSCOPIC AND MICROSCOPIC W/CULTURE REFLEX    MICROALBUMIN URINE, RANDOM    COMPREHENSIVE METABOLIC PANEL, NON-FASTING    THYROID STIMULATING HORMONE WITH FREE T4 REFLEX    IRON    IRON TRANSFERRIN AND TIBC    FERRITIN    IRON TRANSFERRIN AND TIBC    Referral to GENERAL SURGERY -  - DUREMDES, MULLINS, HOPKINS    telmisartan (MICARDIS) 80 mg Oral Tablet    rosuvastatin (CRESTOR) 10 mg Oral Tablet    hydroCHLOROthiazide (HYDRODIURIL) 25 mg Oral Tablet          The patient has been educated about risk factors and recommended preventive care. Written Prevention  Plan completed/ updated and given to patient (see After Visit Summary).    Return in about 3 months (around 12/07/2022).    Lucia Gaskins, DO  09/07/2022 12:36

## 2022-09-09 ENCOUNTER — Telehealth (INDEPENDENT_AMBULATORY_CARE_PROVIDER_SITE_OTHER): Payer: Self-pay | Admitting: Internal Medicine

## 2022-09-09 NOTE — Telephone Encounter (Signed)
Durmdes was overruled and he needs to find a Careers adviser out of the area.

## 2022-09-09 NOTE — Telephone Encounter (Signed)
Pt called to check status of you speaking to dr duremedes?

## 2022-09-11 IMAGING — MR MRI SHOULDER RT W/O CONTRAST
7 series · 40 of 40 positions shown · non-contrast
Comparison: December 03, 2021

﻿EXAM:  67003   MRI SHOULDER RT W/O CONTRAST
INDICATION: Right shoulder pain radiating down right arm, limited range of motion.
TECHNIQUE: Noncontrast multiplanar, multisequence MRI was performed.

[Series 6: T1 · oblique · right · 3.5mm · 0.36mm/px · 6 of 18 slices shown]
[im 1/18]
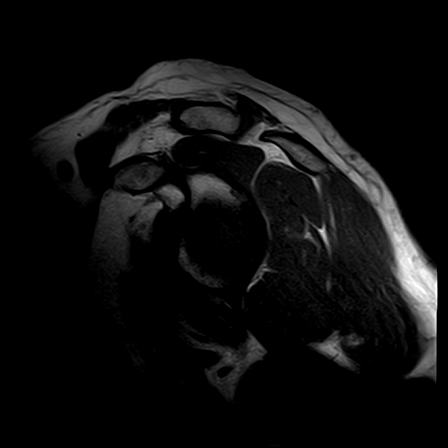
[im 4/18]
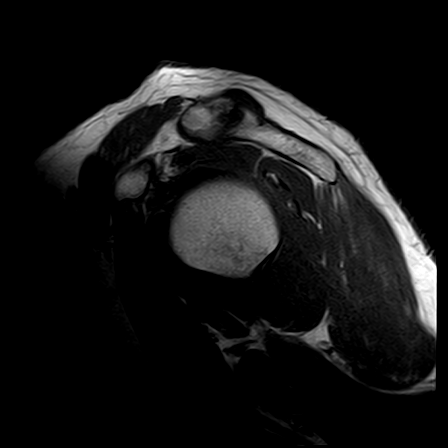
[im 7/18]
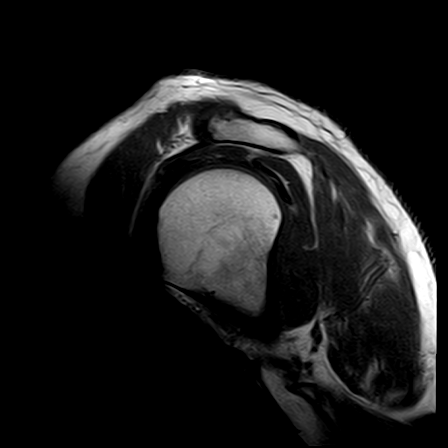
[im 11/18]
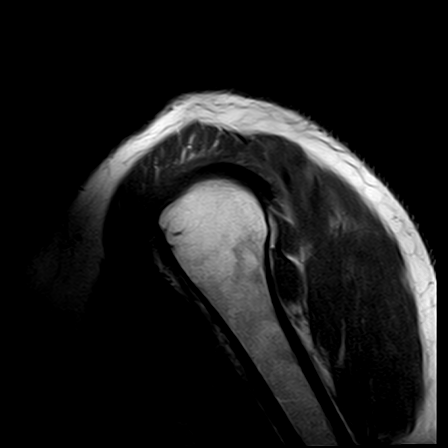
[im 14/18]
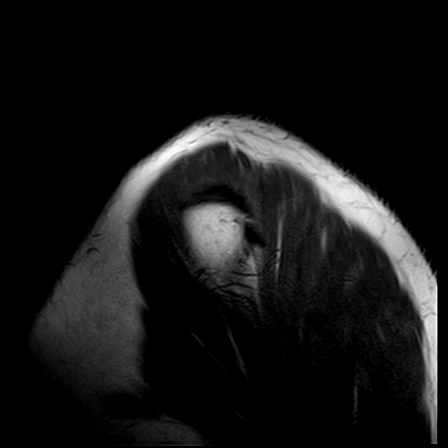
[im 18/18]
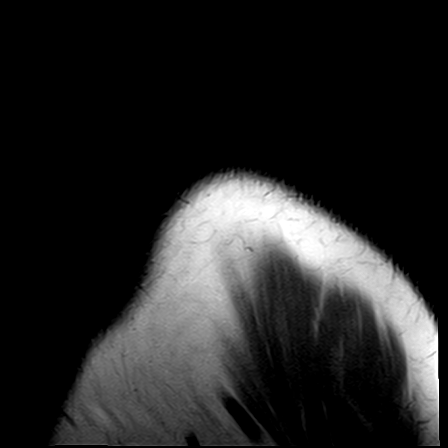

[Series 7: STIR · oblique · right · 3.5mm · 0.47mm/px · 6 of 18 slices shown (1 of 2)]
[im 1/18]
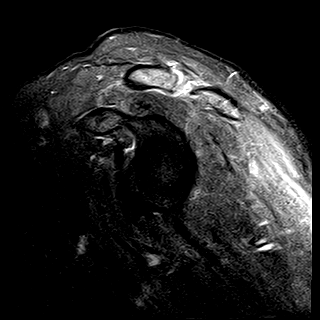
[im 4/18]
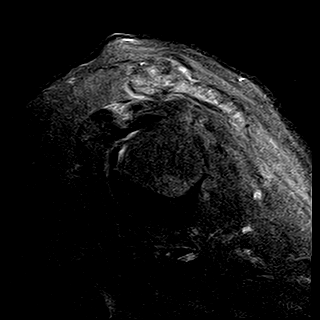
[im 7/18]
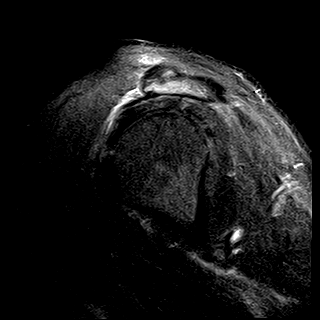
[im 11/18]
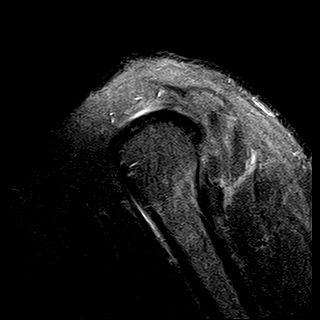
[im 14/18]
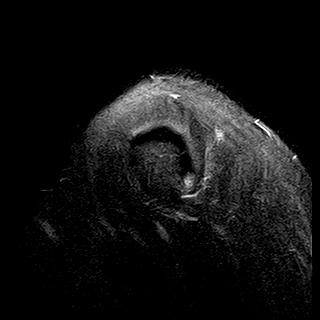
[im 18/18]
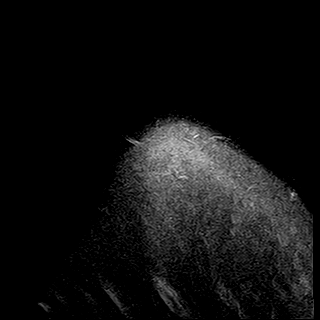

[Series 8: PD fat-sat · axial · right · 4.0mm · 0.50mm/px · z∈[-16,+61]mm · 6 of 18 slices shown (1 of 2)]
[im 1/18]
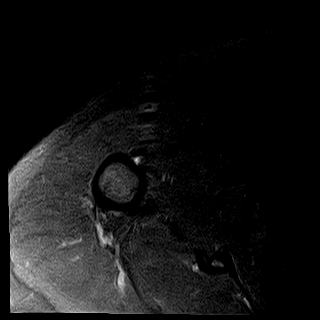
[im 4/18]
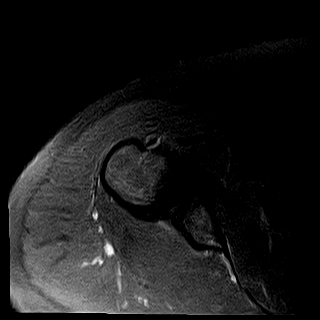
[im 7/18]
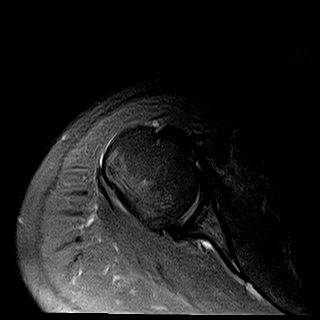
[im 11/18]
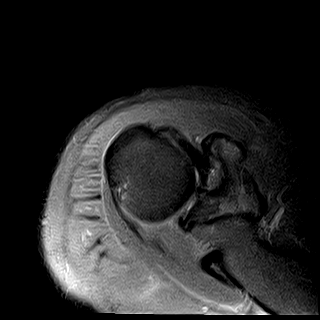
[im 14/18]
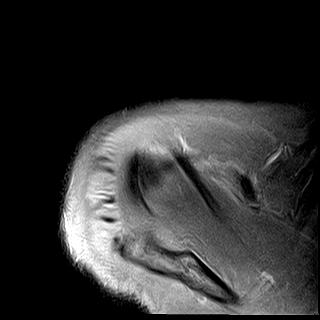
[im 18/18]
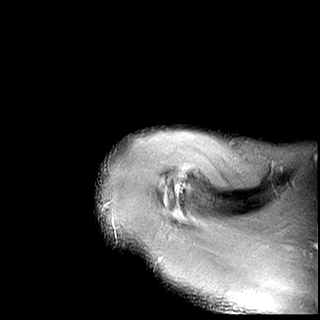

[Series 9: T2 fat-sat · axial · right · 4.0mm · 0.42mm/px · z∈[-29,+74]mm · 7 of 24 slices shown]
[im 1/24]
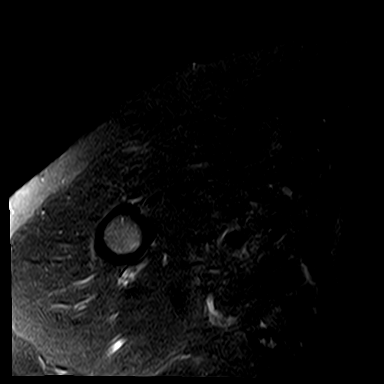
[im 4/24]
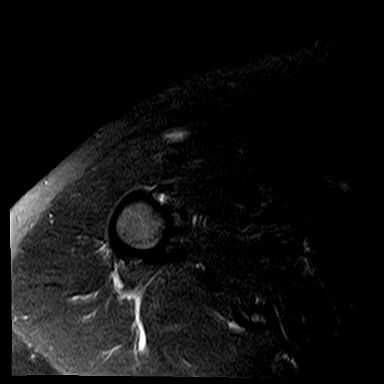
[im 8/24]
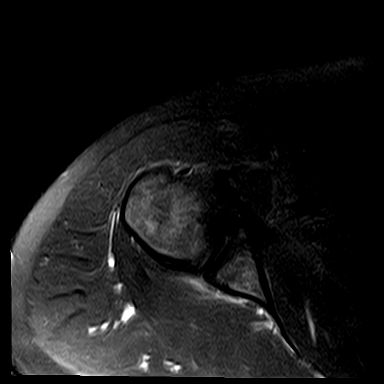
[im 12/24]
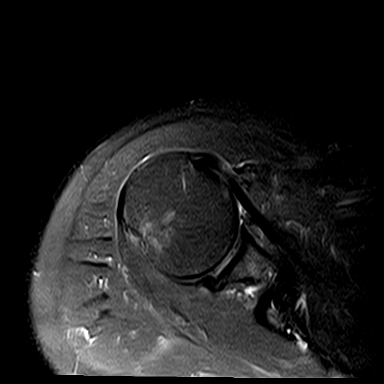
[im 16/24]
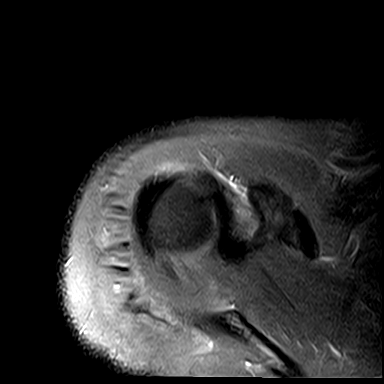
[im 20/24]
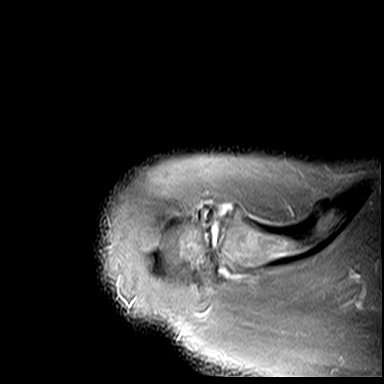
[im 24/24]
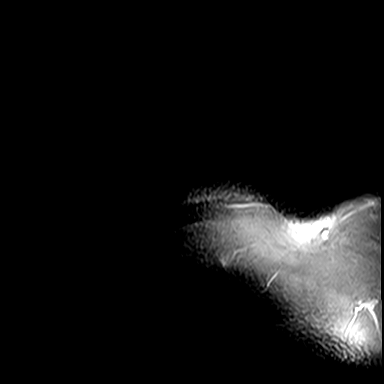

[Series 10: PD fat-sat · oblique · right · 3.5mm · 0.47mm/px · 5 of 18 slices shown (2 of 2)]
[im 1/18]
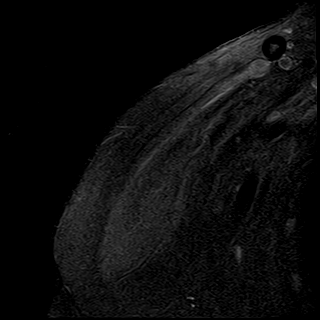
[im 5/18]
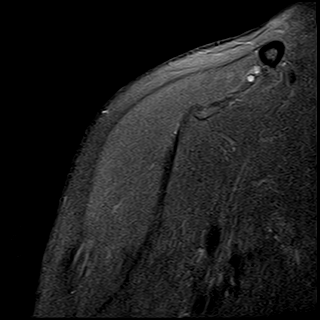
[im 9/18]
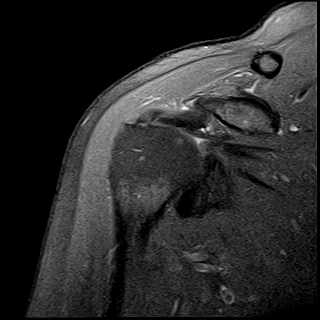
[im 13/18]
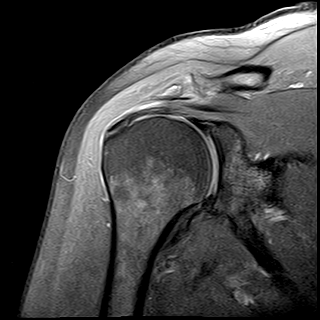
[im 18/18]
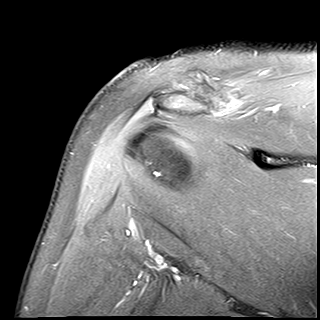

[Series 11: STIR · oblique · right · 3.5mm · 0.47mm/px · 5 of 18 slices shown (2 of 2)]
[im 1/18]
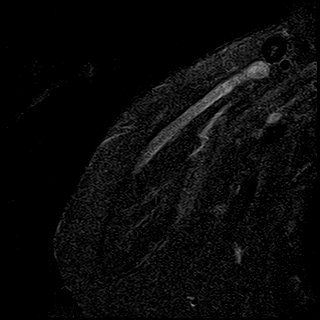
[im 5/18]
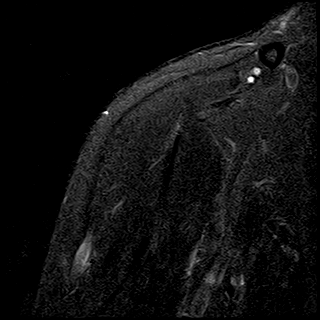
[im 9/18]
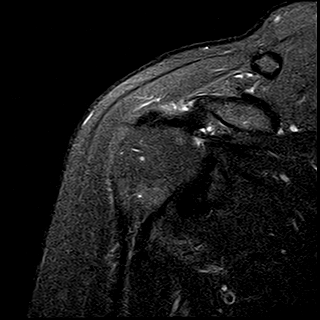
[im 13/18]
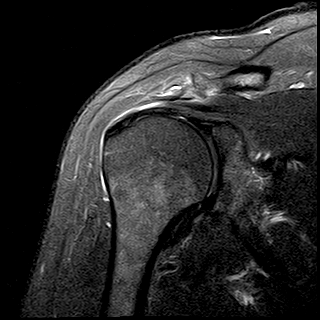
[im 18/18]
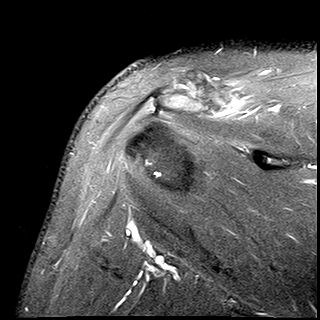

[Series 12: sag (id) · oblique · right · 3.5mm · 0.47mm/px · 5 of 18 slices shown]
[im 1/18]
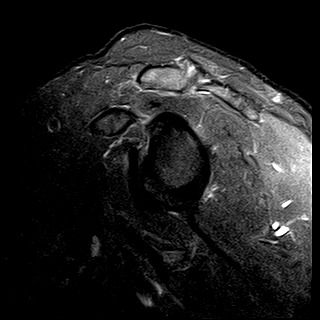
[im 5/18]
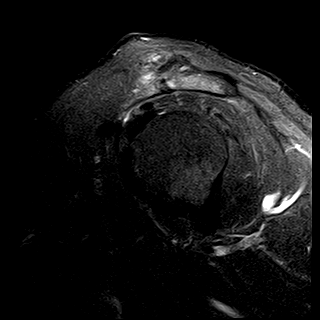
[im 9/18]
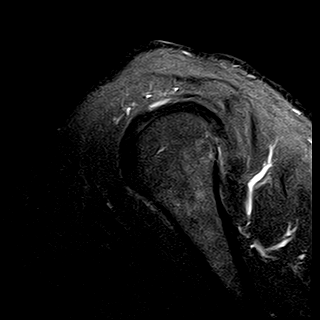
[im 13/18]
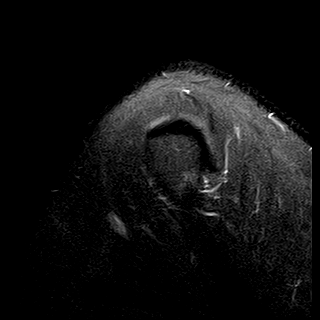
[im 18/18]
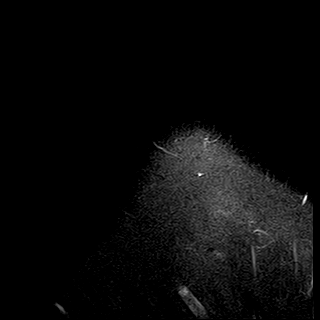

[40 of 40 positions shown; findings below may reference images not displayed]

FINDINGS: Compared to the prior exam dated December 03, 2021, there has been no significant change.  Again noted is supraspinatus tendinosis.  The remaining visualized tendons appear intact.  

The glenoid labrum is intact.  There is no fracture, dislocation, or significant marrow signal alteration.  There is no significant joint effusion.  

There are moderate degenerative changes involving the acromioclavicular joint.  The acromion is somewhat down sloping with a small spur impinging on the supraspinatus tendon.
IMPRESSION: 1. Supraspinatus tendinosis.

2. No rotator cuff tear is seen.

## 2022-09-18 ENCOUNTER — Other Ambulatory Visit: Payer: Self-pay

## 2022-09-23 ENCOUNTER — Other Ambulatory Visit (HOSPITAL_COMMUNITY): Payer: Self-pay | Admitting: Orthopaedic Surgery

## 2022-09-23 ENCOUNTER — Encounter (HOSPITAL_COMMUNITY): Payer: Self-pay

## 2022-09-23 ENCOUNTER — Ambulatory Visit (HOSPITAL_COMMUNITY)
Admission: RE | Admit: 2022-09-23 | Discharge: 2022-09-23 | Disposition: A | Discharge status: Still In Hospital | Payer: Medicare Other | Source: Ambulatory Visit

## 2022-09-23 ENCOUNTER — Other Ambulatory Visit: Payer: Self-pay

## 2022-09-23 DIAGNOSIS — M19011 Primary osteoarthritis, right shoulder: Secondary | ICD-10-CM

## 2022-09-23 DIAGNOSIS — M7581 Other shoulder lesions, right shoulder: Secondary | ICD-10-CM

## 2022-09-23 DIAGNOSIS — G5680 Other specified mononeuropathies of unspecified upper limb: Secondary | ICD-10-CM

## 2022-09-23 DIAGNOSIS — G8929 Other chronic pain: Secondary | ICD-10-CM

## 2022-09-23 NOTE — PT Evaluation (Signed)
[facility] Medicine [facility]  Outpatient Physical Therapy  [address]  [place], [address]  [phone]  (Fax) [phone]       Physical Therapy Upper Extremity Evaluation    Date: 09/23/2022  Patient's Name: Steven Henderson  Date of Birth: Dec 04, 1950  Physical Therapy Evaluation      PT diagnosis/Reason for Referral: R shoulder pain             SUBJECTIVE  Date of onset: 2 years     Mechanism of injury: insidious onset of pain     Previous episodes/treatments: previous PT ~4 months ago with patient reporting no progress made with treatment targeting R shoulder mobility and strengthening. Patient reports no therapy performed targeted scapulothoracic region.    PLOF: no issues prior to onset ~2 years ago with R shoulder     Medications for this problem:  not currently taking any pain medication for shoulder, received steroid injection in the first week of May with mild improvement noted     Diagnostic tests: recent MRI [facility] in [place], no significant results shown per patient     Patient goals: REDUCE PAIN and NORMALIZE FUNCTION    Occupation:  Research officer, [employer] (nursing home), requires a lot of manual labor     Next MD visit: 10/16/22     Pain location: R shoulder with patient indicating top of shoulder and bottom of shoulder blade                Pain description: STABBING    Pain frequency:  INTERMITTENT    Pain rating: Now 5   Best 5   Worst 8    Radiculopathy: none reported     Pain increases with: STAND, WALK, LIFTING, and raising arm overhead            decreases with : MASSAGE    Sensation: none reported     Weakness: R UE     Sleep affected: not affected     Headaches: no     Dizziness:  no    Subjective Functional Reports:    Sitting: LIMITED and painful     Standing: LIMITED and painful     Walking: WFL    Lifting: LIMITED and painful if lifting over 90 degrees shoulder elevation     Patient-Specific Functional Score:    Problem Score   1. Lifting objects at work  10    2. Lifting arm overhead  5       TOTAL 7.5     OBJECTIVE     Cervical screening: 20 flexion, 15 extension, 15 LSB, 15 RSB and painful, 60 LROT, 60 RROT      Shoulder AROM    Right (degrees) Left (degrees)   Flexion 93 150   Abduction 95 140   ER 50 65   IR T12 Contralateral inferior angle of scap         ROM comment: PROM patient able to achieve ~5-10 degrees more with flexion/ABD but limited with firm end-feel and painful      Strength:  RUE: 4+/5  LUE: 5/5    Elbow AROM: WFL     Joint mobility: BIL shoulder WNL     Palpation: TTP along supraspinatus muscle belly and along medial border or scapula     Posture: increased thoracic kyphosis, rounded shoulders, R scapular protraction and elevation     Shoulder special tests:    Open can/empy can: negative right     SCAPULAR  DYSKINESIA STATIC: POSITIVE RIGHT    SCAPULAR DYSKINESIA DYNAMIC: POSITIVE RIGHT    LIFT OFF TEST: NEGATIVE RIGHT      Treatment provided:REVIEW OF POC AND GOALS WITH PATIENT, ALL QUESTIONS ANSWERED, PATIENT EDUCATION, and THERAPEUTIC EXERCISE     Access Code: [email or web address]  URL: [email or web address]  Date: 09/23/2022  Prepared by: Unk Lightning    Exercises  - Scapular Retraction with Resistance  - 2 x daily - 7 x weekly - 2 sets - 10 reps  - Seated Cervical Sidebending Stretch  - 2 x daily - 7 x weekly - 2-3 sets - 30 reps          ASSESSMENT    Impression: Patient demonstrates limited R shoulder mobility with biggest limitations in ER, FLEX, ABD. Possible impingement occurring in subacromial space but no evident RC tear noted. Patient reports most recent MRI of the R shoulder was negative for any significant findings. Patient reports pain over superior GH joint line and medial border of scapula. He demo's poor sitting posture with rounded shoulder's and fwd head with a protracted and elevated scapula. During initial HEP training, patient reports less pain with symptom modification procedure of proper positioning of the R scapula before  performing mid row exercise. Patient will benefit from skilled PT services to address deficits noted during assessment and improve his functional mobility and QOL.     Rehab potential: FAIR      Short term goals (2 weeks):  -Subjective c/o intermittent verus Constant pain. Worst SPS rating less than 6/10   -Resting pain level and proximal stability WFL to permit normal posturing of affected UE at rest.   -10-15 degree increase in R shoulder AROM into flexion, abduction, and ER.       Long term goals (4weeks):  -AROM and strength WFL for use of involved UE with personal/household/work ADLs without compensatory mechanics due to weakness or pain.   -Max SPS rating = to or 3/10 or less.  -Patient will demo improved postural awareness and control of the R scapulothoracic region.             PLAN  Patient will attend 2 times per week x 4 weeks. 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        Evaluation complexity:   Personal factors impacting POC: FREQUENT OR CHRONIC PAIN   Co-morbidities impacting POC:  none   Complexity of physical exam: INCLUDING MUSCULOSKELETAL SYSTEM (POSTURE, ROM, STRENGTH, HEIGHT/WEIGHT) and INCLUDING ACTIVITY/MOBILITY RESTRICTIONS   Clinical Presentation: STABLE   Evaluation Complexity: LOW-HISTORY 0, EXAMINATION 1-2, STABLE PRESENTATION        Total Session Time 45, Timed code minutes 8, and Untimed code minutes 37        Intervention minutes: EVALUATION 37 minutes and THERAPEUTIC EXERCISE 8 minutes    Unk Lightning, PT  09/23/2022, 11:52    Start of Service: _________          Certification:    From:______  Through:_________    I certify the need for these services furnished under this plan of treatment and while under my care.    Referring Provider Signature: _______________      Date : _____________________    Printed Name of Referring Provider: __________________________________________

## 2022-09-29 ENCOUNTER — Ambulatory Visit (HOSPITAL_COMMUNITY)
Admission: RE | Admit: 2022-09-29 | Discharge: 2022-09-29 | Disposition: A | Discharge status: Still In Hospital | Payer: Medicare Other | Source: Ambulatory Visit

## 2022-09-29 ENCOUNTER — Other Ambulatory Visit: Payer: Self-pay

## 2022-09-29 NOTE — PT Treatment (Addendum)
[facility] Medicine [facility]  Outpatient Physical Therapy  [address]  [place], [address]  [phone]  (Fax) [phone]    Physical Therapy Treatment Note    Date: 09/29/2022  Patient's Name: Steven Henderson  Date of Birth: 21-Feb-1951  Physical Therapy Visit            Visit #/POC:2 of up to 8 planned  Authorization: medical necessity  POC Signed?: no  POC Ends: 10/28/22  Next Progress Note Due: by 10th visit or before 10/24/22      Evaluating Physical Therapist: Unk Lightning  PT diagnosis/Reason for Referral: right shoulder pain  Next Scheduled Physician Appointment: 10/16/22  Allergies/Contraindications: none stated          Subjective: Patient rates pain today 6/10. He states shoulder feels "heavy" when he tries to lift it. Shoulder does not limit sleep.     Objective: Treatment delivered as noted below:    Measured ROM: NT today  EXERCISE/ACTIVITY NAME REPETITIONS RESISTANCE COMPLETED THIS DOS   UBE    6 minutes    Yes    GH joint mobilization posterior and inferior    Grade I-II   Yes    Scapular mobilization       Yes    Sidelying scapular retraction against manual resistance   10 rep  manual Yes    KT tape postural correction   Two I strips   Yes    HEP progression       Yes    MFR trigger point super scapular, inferior scapula    Yes                     Assessment: Right scapula resting position more elevated than left. Mild scapular winging present compared to left side. Trial of KT tape for postural correction was done. Patient was educated on tape precautions and to remove if any irritation occurs. Patient did have reproduction of right arm symptoms was noted with trigger point palpation.     Short term goals (2 weeks):  -Subjective c/o intermittent verus Constant pain. Worst SPS rating less than 6/10   -Resting pain level and proximal stability WFL to permit normal posturing of affected UE at rest.   -10-15 degree increase in R shoulder AROM into flexion, abduction, and ER.          Long term goals (4weeks):  -AROM and strength WFL for use of involved UE with personal/household/work ADLs without compensatory mechanics due to weakness or pain.   -Max SPS rating = to or 3/10 or less.  -Patient will demo improved postural awareness and control of the R scapulothoracic region.     Plan: Reassess tolerance to today's additions and progress as able.     Total Session Time 40 and Timed code minutes 40  THERAPEUTIC EXERCISE 25 minutes and JOINT MOBILIZATION/MFR 15 minutes      Marlaina Coburn, PTA  09/29/2022, 10:58

## 2022-10-01 ENCOUNTER — Other Ambulatory Visit: Payer: Self-pay

## 2022-10-01 ENCOUNTER — Ambulatory Visit (HOSPITAL_COMMUNITY)
Admission: RE | Admit: 2022-10-01 | Discharge: 2022-10-01 | Disposition: A | Discharge status: Still In Hospital | Payer: Medicare Other | Source: Ambulatory Visit

## 2022-10-01 NOTE — PT Treatment (Signed)
[facility] Medicine [facility]  Outpatient Physical Therapy  [address]  [place], [address]  [phone]  (Fax) [phone]    Physical Therapy Treatment Note    Date: 10/01/2022  Patient's Name: Steven Henderson  Date of Birth: 06/15/1950  Physical Therapy Visit            Visit #/POC: 3 of up to 8 planned  Authorization: medical necessity  POC Signed?: no  POC Ends: 10/28/22  Next Progress Note Due: by 10th visit or before 10/24/22      Evaluating Physical Therapist: Unk Lightning  PT diagnosis/Reason for Referral: right shoulder pain  Next Scheduled Physician Appointment: 10/16/22  Allergies/Contraindications: none stated          Subjective: Reports shoulder was aggravated yesterday but thinks it was due to work. Does not think Tuesday's session aggravated anything. He does not think KT tape helped much. He states shoulder is feeling pretty good today.     Objective: Treatment delivered as noted below:    Measured ROM: NT today  EXERCISE/ACTIVITY NAME REPETITIONS RESISTANCE COMPLETED THIS DOS   UBE    6 minutes    Yes    GH joint mobilization posterior and inferior    Grade I-II   Yes    Scapular mobilization       Yes    Sidelying scapular retraction against manual resistance   10 rep  manual Yes    KT tape postural correction   Two I strips   No   HEP progression       Yes    MFR trigger point super scapular, inferior scapula    Yes   Seated row 10 rep 30# Yes    Serratus punch 10 rep  Yes    Scapular depression seated 10 rep  Yes          Assessment: Patient demonstrates significant difficulty with serratus anterior punch on right side. Had perform with non-affected UE and was able to perform with good form. Was able to perform 10 reps on right side after this with verbal cueing. Multiple trigger points along medial right scapular border. Wants to wear tape a couple more days to see if he feels it helps.     Short term goals (2 weeks):  -Subjective c/o intermittent verus Constant pain. Worst  SPS rating less than 6/10   -Resting pain level and proximal stability WFL to permit normal posturing of affected UE at rest.   -10-15 degree increase in R shoulder AROM into flexion, abduction, and ER.         Long term goals (4weeks):  -AROM and strength WFL for use of involved UE with personal/household/work ADLs without compensatory mechanics due to weakness or pain.   -Max SPS rating = to or 3/10 or less.  -Patient will demo improved postural awareness and control of the R scapulothoracic region.     Plan:  Continue progression of strength as patient tolerates. Manual as needed.     Total Session Time 37 and Timed code minutes 37  THERAPEUTIC EXERCISE 22 minutes and JOINT MOBILIZATION/MFR 15 minutes      [employer], PTA  10/01/2022 11:34

## 2022-10-06 ENCOUNTER — Ambulatory Visit (HOSPITAL_COMMUNITY)
Admission: RE | Admit: 2022-10-06 | Discharge: 2022-10-06 | Disposition: A | Discharge status: Still In Hospital | Payer: Medicare Other | Source: Ambulatory Visit

## 2022-10-06 ENCOUNTER — Other Ambulatory Visit: Payer: Self-pay

## 2022-10-06 NOTE — PT Treatment (Signed)
[facility] Medicine [facility]  Outpatient Physical Therapy  [address]  [place], [address]  [phone]  (Fax) [phone]    Physical Therapy Treatment Note    Date: 10/06/2022  Patient's Name: Maxime Terlizzi  Date of Birth: 1951/01/27  Physical Therapy Visit            Visit #/POC: 4 of up to 8 planned  Authorization: medical necessity  POC Signed?: no  POC Ends: 10/28/22  Next Progress Note Due: by 10th visit or before 10/24/22      Evaluating Physical Therapist: Unk Lightning  PT diagnosis/Reason for Referral: right shoulder pain  Next Scheduled Physician Appointment: 10/23/22  Allergies/Contraindications: none stated          Subjective: Feeling good today. Reports top of shoulder feels "stiff" but not painful. Doesn't think tape helped much and has taken it off.     Objective: Treatment delivered as noted below:    Measured ROM: NT today  EXERCISE/ACTIVITY NAME REPETITIONS RESISTANCE COMPLETED THIS DOS   UBE    6 minutes    Yes    GH joint mobilization posterior and inferior    Grade I-II   Yes    Scapular mobilization       Yes    Sidelying scapular retraction against manual resistance   10 rep  manual Yes    KT tape postural correction   Two I strips   No   HEP progression       Yes    MFR trigger point super scapular, inferior scapula    Yes   Seated row 3 x 10 rep 35# Yes    Serratus punch 10 rep  Yes    Scapular depression seated 10 rep  Yes    Subscap lift off door 10 rep  Yes      Serratus press against ball at 90 degrees flexion 10 rep  Yes    Horizontal Abduction B/L/R 10 rep each Red Yes      Access Code: [email or web address]  URL: [email or web address]  Date: 10/06/2022  Prepared by: Camelia Eng    Exercises  - Supine Shoulder Horizontal Abduction with Resistance  - 1 x daily - 5 x weekly - 1 sets - 10 reps  - Standing Subscapularis Lift-Off  - 1 x daily - 5 x weekly - 1 sets - 10 reps    Assessment: Demonstrates subscap and serratus anterior weakness on right. Progressed exercise  today with good tolerance and no increase in pain. Patient given updated exercises for HEP and red band for home.       Short term goals (2 weeks):  -Subjective c/o intermittent verus Constant pain. Worst SPS rating less than 6/10   -Resting pain level and proximal stability WFL to permit normal posturing of affected UE at rest.   -10-15 degree increase in R shoulder AROM into flexion, abduction, and ER.         Long term goals (4weeks):  -AROM and strength WFL for use of involved UE with personal/household/work ADLs without compensatory mechanics due to weakness or pain.   -Max SPS rating = to or 3/10 or less.  -Patient will demo improved postural awareness and control of the R scapulothoracic region.     Plan:  Continue progression of strength as patient tolerates. Manual as needed.     Total Session Time 40 and Timed code minutes 40  THERAPEUTIC EXERCISE 40 minutes      [employer], PTA  10/06/2022  13:23

## 2022-10-08 ENCOUNTER — Ambulatory Visit (HOSPITAL_COMMUNITY)
Admission: RE | Admit: 2022-10-08 | Discharge: 2022-10-08 | Disposition: A | Discharge status: Still In Hospital | Payer: Medicare Other | Source: Ambulatory Visit

## 2022-10-08 ENCOUNTER — Other Ambulatory Visit: Payer: Self-pay

## 2022-10-08 NOTE — PT Treatment (Signed)
[facility] Medicine [facility]  Outpatient Physical Therapy  [address]  [place], [address]  [phone]  (Fax) [phone]    Physical Therapy Treatment Note    Date: 10/08/2022  Patient's Name: Steven Henderson  Date of Birth: 1950-11-10  Physical Therapy Visit        Visit #/POC: 5 of up to 8 planned  Authorization: medical necessity  POC Signed?: no  POC Ends: 10/28/22  Next Progress Note Due: by 10th visit or before 10/24/22        Evaluating Physical Therapist: Unk Lightning  PT diagnosis/Reason for Referral: right shoulder pain  Next Scheduled Physician Appointment: 10/23/22  Allergies/Contraindications: none stated              Subjective: Patient reports continued concordant pain in R shoulder blade region with stiffness on top of shoulder. No significant change since evaluation. States IR lift off exercise was painful to do at home.      Objective: Treatment delivered as noted below:     Measured ROM: NT today  EXERCISE/ACTIVITY NAME REPETITIONS RESISTANCE COMPLETED THIS DOS   UBE     6 minutes    Yes    GH joint mobilization posterior and inferior     Grade I-II   No     Scapular mobilization        No   Sidelying scapular retraction against manual resistance    10 rep  manual No   KT tape postural correction    Two I strips   No   HEP progression        No     MFR trigger point super scapular, inferior scapula     IASTM using blue wave tool to supraspinatus/UT, medial border scapula, RC external rotators         X12 min        Manual  No       Yes    Seated row 2 x 10 rep 35# Yes    Serratus punch supine  3x10 3# DB Yes    Scapular depression seated 10 rep   Yes    Subscap lift off door 10 rep   No        Serratus press against ball at 90 degrees flexion    Shoulder stability rolls 2# ball against wall at 90 flexion 10 rep      30" CW, 30" CCW        2# ball  No      Yes    Horizontal Abduction supie 2x10 rep alternating R/L Red TB Yes          Assessment: Patient denies blood thinner  use at this time. Moderate redness and petechiae noted throughout IASTM peri-scapular regions. Patient tolerated therex and progressions well today including added weight for serratus punch and increased reps for resisted horizontal ABD. Patient will benefit from continued skilled PT services at this time to address remaining deficits and improved functional mobility/QOL.        Short term goals (2 weeks):  -Subjective c/o intermittent verus Constant pain. Worst SPS rating less than 6/10   -Resting pain level and proximal stability WFL to permit normal posturing of affected UE at rest.   -10-15 degree increase in R shoulder AROM into flexion, abduction, and ER.         Long term goals (4weeks):  -AROM and strength WFL for use of involved UE with personal/household/work ADLs without compensatory mechanics due  to weakness or pain.   -Max SPS rating = to or 3/10 or less.  -Patient will demo improved postural awareness and control of the R scapulothoracic region.      Plan:  Continue progression of peri-scapular strength as patient tolerates. Manual as needed.     Total Session Time 41 and Timed code minutes 41  THERAPEUTIC EXERCISE 29 minutes and JOINT MOBILIZATION/MFR 12 minutes      Unk Lightning, PT  10/08/2022, 11:39

## 2022-10-13 ENCOUNTER — Other Ambulatory Visit: Payer: Self-pay

## 2022-10-13 ENCOUNTER — Ambulatory Visit (HOSPITAL_COMMUNITY)
Admission: RE | Admit: 2022-10-13 | Discharge: 2022-10-13 | Disposition: A | Discharge status: Still In Hospital | Payer: Medicare Other | Source: Ambulatory Visit

## 2022-10-13 DIAGNOSIS — G5680 Other specified mononeuropathies of unspecified upper limb: Secondary | ICD-10-CM

## 2022-10-13 DIAGNOSIS — G8929 Other chronic pain: Secondary | ICD-10-CM

## 2022-10-13 NOTE — PT Treatment (Signed)
[facility] Medicine [facility]  Outpatient Physical Therapy  [address]  [place], [address]  [phone]  (Fax) [phone]    Physical Therapy Treatment Note    Date: 10/13/2022  Patient's Name: Steven Henderson  Date of Birth: 27-Sep-1950  Physical Therapy Visit        Visit #/POC: 6 of up to 8 planned  Authorization: medical necessity  POC Signed?: no  POC Ends: 10/28/22  Next Progress Note Due: by 10th visit or before 10/24/22        Evaluating Physical Therapist: Unk Lightning  PT diagnosis/Reason for Referral: right shoulder pain  Next Scheduled Physician Appointment: 10/23/22  Allergies/Contraindications: none stated              Subjective: Continues to report pain around inferior border of right scapula.      Objective: Treatment delivered as noted below:     Measured ROM: NT today  EXERCISE/ACTIVITY NAME REPETITIONS RESISTANCE COMPLETED THIS DOS   UBE     6 minutes    Yes    GH joint mobilization posterior and inferior     Grade I-II   No     Scapular mobilization        No   Sidelying scapular retraction against manual resistance    10 rep  manual No   KT tape postural correction    Two I strips   No   HEP progression        No     MFR trigger point superior angle of scapula, inferior scapula     IASTM to supraspinatus/UT, medial border scapula, RC external rotators         [employer]/hands today        Manual  Yes       Yes    Seated row 2 x 15 rep 35# Yes    Serratus punch supine  3x10 3# DB No    Scapular depression seated 10 rep   No     Subscap lift off door 10 rep   Yes       Serratus press against ball at 90 degrees flexion    Shoulder stability rolls 2# ball against wall at 90 flexion 10 rep      30" CW, 30" CCW        2# ball  No      Yes    Horizontal Abduction supine 2x10 rep alternating R/L Red TB No    Cross body stretch  20 sec x 2  Yes          Assessment: Patient noted pain improvement post MFR. Large spasm noted near superior border of right scapula. Tolerated IASTM well  however, as noted in previous visit, had increased redness and small area of petechiae. No bruising or redness was noted at start of session today.        Short term goals (2 weeks):  -Subjective c/o intermittent verus Constant pain. Worst SPS rating less than 6/10   -Resting pain level and proximal stability WFL to permit normal posturing of affected UE at rest.   -10-15 degree increase in R shoulder AROM into flexion, abduction, and ER.         Long term goals (4weeks):  -AROM and strength WFL for use of involved UE with personal/household/work ADLs without compensatory mechanics due to weakness or pain.   -Max SPS rating = to or 3/10 or less.  -Patient will demo improved postural awareness and control  of the R scapulothoracic region.      Plan:  Continue progression of peri-scapular strength.    Total Session Time 40 and Timed code minutes 40  THERAPEUTIC EXERCISE 25 minutes and JOINT MOBILIZATION/MFR 15 minutes      [employer], PTA  10/13/2022 15:51

## 2022-10-15 ENCOUNTER — Ambulatory Visit
Admission: RE | Admit: 2022-10-15 | Discharge: 2022-10-15 | Disposition: A | Discharge status: Still In Hospital | Payer: Medicare Other | Source: Ambulatory Visit

## 2022-10-15 ENCOUNTER — Other Ambulatory Visit: Payer: Self-pay

## 2022-10-15 NOTE — PT Treatment (Signed)
[facility] Medicine [facility]  Outpatient Physical Therapy  [address]  [place], [address]  [phone]  (Fax) [phone]    Physical Therapy Treatment Note    Date: 10/15/2022  Patient's Name: Steven Henderson  Date of Birth: 05/25/1950  Physical Therapy Visit        Visit #/POC: 7 of up to 8 planned  Authorization: medical necessity  POC Signed?: no  POC Ends: 10/28/22  Next Progress Note Due: by 10th visit or before 10/24/22        Evaluating Physical Therapist: Unk Lightning  PT diagnosis/Reason for Referral: right shoulder pain  Next Scheduled Physician Appointment: 10/23/22  Allergies/Contraindications: none stated              Subjective: Feeling some better the past couple of days. He felt better after last session. He rates pain at present 6/10. He reports pain is worst when he is doing activities standing up.      Objective: Treatment delivered as noted below:     Measured ROM: Standing shoulder flexion: 124 degrees; abduction 105 degrees; IR to L1; ER to C2    EXERCISE/ACTIVITY NAME REPETITIONS RESISTANCE COMPLETED THIS DOS   UBE     6 minutes    Yes    GH joint mobilization posterior and inferior     Grade I-II   No     Scapular mobilization        No   Sidelying scapular retraction against manual resistance    10 rep  manual No   KT tape postural correction    Two I strips   No   HEP progression        No     MFR trigger point superior angle of scapula, inferior scapula     IASTM to supraspinatus/UT, medial border scapula, RC external rotators         Rockblade/hands today        Manual  Yes       Yes    Seated row 2 x 15 rep 35# Yes    Serratus punch supine  2 x 15  3# DB Yes   Scapular depression seated 10 rep   No     Subscap lift off door 10 rep   Yes       Serratus press against ball at 90 degrees flexion    Shoulder stability rolls 2# ball against wall at 90 flexion 10 rep      30" CW, 30" CCW        2# ball  No      Yes    Horizontal Abduction supine 2x10 rep alternating R/L  Red TB No    Cross body stretch  20 sec x 2  Yes        Patient-Specific Functional Score:     Problem Score   1. Lifting objects at work  8   2. Lifting arm overhead  8         TOTAL 8        Assessment: Patient estimates 80% overall improvement. Continues to report periscapular pain however relates it is primarily with standing activity. He is progressing well toward goals.      Short term goals (2 weeks):  -Subjective c/o intermittent verus Constant pain. Worst SPS rating less than 6/10 (Progressing 10/15/22 Intermittent worst 8/10)  -Resting pain level and proximal stability WFL to permit normal posturing of affected UE at rest. (Progressing 10/15/22)  -10-15 degree  increase in R shoulder AROM into flexion, abduction, and ER. MET 10/15/22        Long term goals (4weeks):  -AROM and strength WFL for use of involved UE with personal/household/work ADLs without compensatory mechanics due to weakness or pain.   -Max SPS rating = to or 3/10 or less.  -Patient will demo improved postural awareness and control of the R scapulothoracic region.      Plan:  Continue progression of peri-scapular strength.    Total Session Time 48 and Timed code minutes 48  THERAPEUTIC EXERCISE 33 minutes and JOINT MOBILIZATION/MFR 15 minutes      [employer], PTA  10/15/2022 14:49

## 2022-10-20 ENCOUNTER — Ambulatory Visit (HOSPITAL_COMMUNITY): Payer: Self-pay

## 2022-10-21 ENCOUNTER — Other Ambulatory Visit: Payer: Self-pay

## 2022-10-21 ENCOUNTER — Other Ambulatory Visit: Payer: Medicare Other | Attending: MEDICAL ONCOLOGY

## 2022-10-21 LAB — CBC WITH DIFF
BASOPHIL #: 0.1 10*3/uL (ref 0.00–0.10)
BASOPHIL %: 1 % (ref 0–1)
EOSINOPHIL #: 0.2 10*3/uL (ref 0.00–0.50)
EOSINOPHIL %: 3 %
HCT: 43.5 % (ref 36.7–47.1)
HGB: 15.2 g/dL (ref 12.5–16.3)
LYMPHOCYTE #: 1.7 10*3/uL (ref 1.00–3.00)
LYMPHOCYTE %: 25 % (ref 16–44)
MCH: 32.2 pg (ref 23.8–33.4)
MCHC: 34.9 g/dL (ref 32.5–36.3)
MCV: 92.3 fL (ref 73.0–96.2)
MONOCYTE #: 0.7 10*3/uL (ref 0.30–1.00)
MONOCYTE %: 11 % (ref 5–13)
MPV: 9.7 fL (ref 7.4–11.4)
NEUTROPHIL #: 4.1 10*3/uL (ref 1.85–7.80)
NEUTROPHIL %: 60 % (ref 43–77)
PLATELETS: 147 10*3/uL (ref 140–440)
RBC: 4.71 10*6/uL (ref 4.06–5.63)
RDW: 13.1 % (ref 12.1–16.2)
WBC: 6.7 10*3/uL (ref 3.6–10.2)

## 2022-10-21 LAB — COMPREHENSIVE METABOLIC PANEL, NON-FASTING
ALBUMIN/GLOBULIN RATIO: 1.6 — ABNORMAL HIGH (ref 0.8–1.4)
ALBUMIN: 4.4 g/dL (ref 3.5–5.7)
ALKALINE PHOSPHATASE: 42 U/L (ref 34–104)
ALT (SGPT): 26 U/L (ref 7–52)
ANION GAP: 10 mmol/L (ref 4–13)
AST (SGOT): 21 U/L (ref 13–39)
BILIRUBIN TOTAL: 0.6 mg/dL (ref 0.3–1.2)
BUN/CREA RATIO: 19 (ref 6–22)
BUN: 24 mg/dL (ref 7–25)
CALCIUM, CORRECTED: 9.7 mg/dL (ref 8.9–10.8)
CALCIUM: 10 mg/dL (ref 8.6–10.3)
CHLORIDE: 105 mmol/L (ref 98–107)
CO2 TOTAL: 23 mmol/L (ref 21–31)
CREATININE: 1.25 mg/dL (ref 0.60–1.30)
ESTIMATED GFR: 62 mL/min/{1.73_m2} (ref >59)
GLOBULIN: 2.8 — ABNORMAL LOW (ref 2.9–5.4)
GLUCOSE: 139 mg/dL — ABNORMAL HIGH (ref 74–109)
OSMOLALITY, CALCULATED: 282 mOsm/kg (ref 270–290)
POTASSIUM: 4.3 mmol/L (ref 3.5–5.1)
PROTEIN TOTAL: 7.2 g/dL (ref 6.4–8.9)
SODIUM: 138 mmol/L (ref 136–145)

## 2022-10-21 LAB — IRON TRANSFERRIN AND TIBC
IRON (TRANSFERRIN) SATURATION: 29 % (ref 20–50)
IRON: 105 ug/dL (ref 50–212)
TOTAL IRON BINDING CAPACITY: 357 ug/dL (ref 250–450)
TRANSFERRIN: 255 mg/dL (ref 203–362)
UIBC: 252 ug/dL (ref 130–375)

## 2022-10-21 LAB — FERRITIN: FERRITIN: 23 ng/mL (ref 11–336)

## 2022-10-22 ENCOUNTER — Ambulatory Visit
Admission: RE | Admit: 2022-10-22 | Discharge: 2022-10-22 | Disposition: A | Discharge status: Still In Hospital | Payer: Medicare Other | Source: Ambulatory Visit | Attending: Orthopaedic Surgery | Admitting: Orthopaedic Surgery

## 2022-10-22 NOTE — Progress Notes (Addendum)
[facility] Medicine [facility]  Outpatient Physical Therapy  [address]  [place], [address]  [phone]  (Fax) [phone]    Physical Therapy Progress Note    Date: 10/22/2022  Patient's Name: Steven Henderson  Date of Birth: 12/26/1950  Physical Therapy Progress Note           Visit #/POC: 8 of up to 8 planned  Authorization: medical necessity  POC Signed?: no  POC Ends: 10/28/22  Next Progress Note Due: by 10th visit or before 10/24/22        Evaluating Physical Therapist: Unk Lightning  PT diagnosis/Reason for Referral: right shoulder pain  Next Scheduled Physician Appointment: 10/23/22  Allergies/Contraindications: none stated           Subjective: Patient reports ~80% improvement since starting evaluation. Reports 5/10 upon arrival, worst 7-8/10 over past 7 days. Notes he is making more progress this bout of therapy than his previous bout.      OBJECTIVE FROM EVAL    Patient-Specific Functional Score:     Problem Score 6/6    1. Lifting objects at work  10 10   2. Lifting arm overhead  5 7          TOTAL 7.5 8.5         Cervical AROM: 20 flexion (30 on 6/6), 15 extension (30 on 6/6), 15 LSB (25 on 6/6), 15 RSB (20 on 6/6) and painful, 60 LROT, 60 RROT      Shoulder AROM    Right  Left  Right (6/6)   Flexion 93 150 125   Abduction 95 140 110   ER 50 65 50 (stiff vs painful)   IR T12 Contralateral inferior angle of scap T12 and painful          ROM comment: PROM patient able to achieve ~10-15 degrees more with flexion/ABD and with firm end-feel      Strength:  RUE: 4+/5  LUE: 5/5      Objective: Treatment delivered as noted below:        EXERCISE/ACTIVITY NAME REPETITIONS RESISTANCE COMPLETED THIS DOS   UBE     5 minutes (half fwd/ half retro)   Yes    GH joint mobilization posterior and inferior     Grade I-II   No     Scapular mobilization        No   Sidelying scapular retraction against manual resistance    10 rep  manual No   KT tape postural correction    Two I strips   No   HEP  progression        No     MFR trigger point superior angle of scapula, inferior scapula      IASTM to supraspinatus/UT, medial border scapula, RC external rotators     Rockblade/hands today        Blue wave tool   Manual    No           Yes    Seated row 2 x 15 rep 35# No    Serratus punch supine  2 x 15  3# DB No    Scapular depression seated 10 rep   No     Subscap lift off door 10 rep   No       Serratus press against ball at 90 degrees flexion     Shoulder stability rolls 2# ball against wall at 90 flexion 10 rep  30" CW, 30" CCW            2# ball  No          Yes    Horizontal Abduction supine 2x10 rep alternating R/L Red TB No    Cross body stretch  20 sec x 2   Yes    Standing pec major doorway stretch R UE  2x45" holds  Low position  Yes, HEP 6/6    R shoulder FLEX/ABD PROM X3'  manual Yes          Access Code: [email or web address]  URL: [email or web address]  Date: 10/22/2022  Prepared by: Unk Lightning    Exercises  - Doorway Pec Stretch at 60 Degrees Abduction with Arm Straight  - 2 x daily - 7 x weekly - 1 sets - 2-3 reps - 45 sec hold       Assessment: Patient participating in 8th skilled OPPT session this date since initial evaluation on 09/23/22. Patient has demonstrated good adherence to PT POC in relation to attendance and HEP performance. Patient has demonstrated progress towards functional goals as evidenced by meeting 1/6 goals established at initial evaluation in regards to improving R shoulder/scapulohumeral mobility/ROM. Patient continues to demonstrate the following deficits as noted by goals not met below including activity tolerance of R UE and continued peri-scapular pain and GH joint stiffness. Patient will benefit from continued skilled PT services at this time to address remaining deficits and improve functional mobility/QOL.       ADDENDUM 11/17/22: Patient's last attended session 10/22/22. PT requested new POC to continue with skilled PT services 10/22/22 with no POC signature returned.  Patient would be appropriate to return to PT, but will require new order. Patient appropriate for discharge from skilled PT services effective 11/17/22. Unk Lightning, PT, DPT        Short term goals (2 weeks):  -Subjective c/o intermittent verus Constant pain. Worst SPS rating less than 6/10 (PROGRESSING 10/22/22, Intermittent worst 7-8/10)  -Resting pain level and proximal stability WFL to permit normal posturing of affected UE at rest. (PROGRESSING 10/22/22)  -10-15 degree increase in R shoulder AROM into flexion, abduction, and ER. (MET 10/15/22)        Long term goals (4 weeks):  -AROM and strength WFL for use of involved UE with personal/household/work ADLs without compensatory mechanics due to weakness or pain. (PROGRESSING 10/22/22)  -Max SPS rating = to or 3/10 or less. (PROGRESSING 10/22/22)  -Patient will demo improved postural awareness and control of the R scapulothoracic region. (PROGRESSING 10/22/22)       Plan:  Continue progression of peri-scapular strength. Include GH joint mobility/PROM for stiffness noted this date. Patient would benefit from continued skilled PT services for an additional 2x week for 4 more weeks.     Total Session Time 42 and Timed code minutes 42    THERAPEUTIC EXERCISE 28 minutes and JOINT MOBILIZATION/MFR 14 minutes    Unk Lightning, PT  10/22/2022, 13:59        Start of Service:     Re-certification:           From:    Through:       Treatment Diagnosis:           I have reviewed this plan of treatment and re-certify a continuing need for service.      Referring Provider Signature:       Date:        Printed Name of Referring Provider: __________________________________________

## 2022-10-23 ENCOUNTER — Encounter (INDEPENDENT_AMBULATORY_CARE_PROVIDER_SITE_OTHER): Payer: Self-pay | Admitting: NURSE PRACTITIONER

## 2022-10-23 ENCOUNTER — Ambulatory Visit: Payer: Medicare Other | Attending: NURSE PRACTITIONER | Admitting: NURSE PRACTITIONER

## 2022-10-23 ENCOUNTER — Other Ambulatory Visit: Payer: Self-pay

## 2022-10-23 NOTE — Cancer Center Note (Signed)
[facility]       [facility]       CANCER CENTER NOTE     Date: 10/23/2022  Name: Steven Henderson  MRN: [id number]  Referring Physician: Rogers Seeds, MD  Primary Care Provider: Lucia Gaskins, DO    REASON FOR VISIT:   Chief Complaint   Patient presents with    Hemachromatosis        HISTORY OF PRESENT ILLNESS:   71 y.o.male from [facility] [id number] for evaluation and management of hemochromatosis.  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   He has been undergoing phlebotomy treatments which he has been tolerating very well.      His last phlebotomy was in February 2022 which he tolerated really well.  He did not need a phlebotomy after that because of low ferritin.    He is followed by Dr. Ewing Schlein for an elevated PSA at 17. States that for the last year has been having problems with urinary incontinence and frequent urination at night.  He has to wake up twice a night to use the restroom.  Complains of slow urinary stream.     REVIEW OF SYSTEMS:  Review of Systems   Constitutional:  Negative for appetite change, chills, fatigue and fever.   HENT:  Negative.     Eyes:  Positive for eye problems.   Respiratory: Negative.  Negative for cough and shortness of breath.    Cardiovascular: Negative.  Negative for chest pain, leg swelling and palpitations.   Gastrointestinal:  Positive for abdominal pain (intermittently to the lower abdoemen that is described as gnawing pain worse int he am, with some nausea), nausea and vomiting. Negative for blood in stool, constipation and diarrhea.   Genitourinary:  Positive for difficulty urinating. Negative for hematuria.    Musculoskeletal:  Positive for arthralgias (generalized).   Skin: Negative.    Neurological:  Positive for dizziness, light-headedness and numbness (to fingers Carpal tunnel).   Hematological:  Negative for adenopathy. Bruises/bleeds easily.   Psychiatric/Behavioral:   Negative for depression and sleep disturbance. The patient is not nervous/anxious.          PAST MEDICAL HISTORY:  Past Medical History:   Diagnosis Date    Anxiety state     BPH (benign prostatic hyperplasia)     Depression, acute     Diabetes mellitus, type 2 (CMS HCC)     Elevated PSA     Generalized OA     Hemochromatosis     Hyperlipidemia     Hypertension     Hypomagnesemia     Insomnia     Testicular hypofunction     Vitamin D deficiency      MEDICATIONS:  Current Outpatient Medications   Medication Sig    albuterol sulfate (PROVENTIL OR VENTOLIN OR PROAIR) 90 mcg/actuation Inhalation oral inhaler Take 1-2 Puffs by inhalation Every 6 hours as needed    amLODIPine (NORVASC) 10 mg Oral Tablet TAKE (1) TABLET DAILY    aspirin (ECOTRIN) 81 mg Oral Tablet, Delayed Release (E.C.) Take 1 Tablet (81 mg total) by mouth Once a day    finasteride (PROSCAR) 5 mg Oral Tablet Take 1 Tablet (5 mg total) by mouth Once a day    glimepiride (AMARYL) 4 mg Oral Tablet Take 1 Tablet (4 mg total) by mouth Twice daily (Patient taking differently: Take 1 Tablet (4 mg total) by mouth Every evening)    hydroCHLOROthiazide (HYDRODIURIL) 25 mg Oral  Tablet Take 1 Tablet (25 mg total) by mouth Once a day    meloxicam (MOBIC) 15 mg Oral Tablet TAKE (1) TABLET DAILY WITH FOOD. (Patient taking differently: Take 1 Tablet (15 mg total) by mouth Once a day)    MetFORMIN (GLUCOPHAGE) 1,000 mg Oral Tablet Take 1 Tablet (1,000 mg total) by mouth Twice daily with food    methocarbamoL (ROBAXIN) 500 mg Oral Tablet TAKE (1) TABLET FOUR TIMES DAILY. (Patient taking differently: Take 1 Tablet (500 mg total) by mouth Four times a day TAKE (1) TABLET FOUR TIMES DAILY.)    rosuvastatin (CRESTOR) 10 mg Oral Tablet Take 1 Tablet (10 mg total) by mouth Every evening    tamsulosin (FLOMAX) 0.4 mg Oral Capsule Take 1 Capsule (0.4 mg total) by mouth Every evening after dinner    telmisartan (MICARDIS) 80 mg Oral Tablet Take 1 Tablet (80 mg total) by mouth Once  a day     ALLERGIES:  No Known Allergies  PAST SURGICAL HISTORY:  Past Surgical History:   Procedure Laterality Date    CYSTOSCOPY  06/23/2017    HX APPENDECTOMY       SOCIAL HISTORY:  Social History     Tobacco Use   Smoking Status Never   Smokeless Tobacco Never      E-Cigarette Vaping History:  E-Cigarette/Vaping Use: Never User    Substances vaped:  Nicotine: No  CBD: No    Additional information:  Disposable: No  Refillable Tank: No     FAMILY HISTORY:  Family Medical History:       Problem Relation (Age of Onset)    Liver Cancer Father    Stroke Mother          PHYSICAL EXAMINATION:  Vitals:  Vitals:    10/23/22 0940   BP: (!) 145/92   Pulse: 86   Temp: 36.6 C (97.9 F)   TempSrc: Temporal   SpO2: 98%   Weight: 92.1 kg (203 lb)   Height: 1.727 m (5\' 8" )   BMI: 30.93     Body mass index is 30.87 kg/m.    ECOG PS 0  Physical Exam  Constitutional:       General: He is not in acute distress.     Appearance: Normal appearance.   HENT:      Head: Normocephalic and atraumatic.      Nose: Nose normal.      Mouth/Throat:      Mouth: Mucous membranes are moist.      Pharynx: Oropharynx is clear.   Eyes:      General: No scleral icterus.        Right eye: No discharge.         Left eye: No discharge.      Pupils: Pupils are equal, round, and reactive to light.   Cardiovascular:      Rate and Rhythm: Normal rate and regular rhythm.      Pulses: Normal pulses.      Heart sounds: Normal heart sounds.   Pulmonary:      Effort: Pulmonary effort is normal.      Breath sounds: Normal breath sounds.   Abdominal:      General: Abdomen is flat. Bowel sounds are normal.      Palpations: Abdomen is soft. There is no mass.      Tenderness: There is no abdominal tenderness.      Hernia: No hernia is present.   Musculoskeletal:  General: Normal range of motion.      Cervical back: Normal range of motion and neck supple.      Right lower leg: No edema.      Left lower leg: No edema.   Skin:     General: Skin is warm and dry.       Capillary Refill: Capillary refill takes 2 to 3 seconds.      Findings: Bruising present. No erythema.   Neurological:      General: No focal deficit present.      Mental Status: He is alert and oriented to person, place, and time.      Motor: No weakness.      Gait: Gait normal.   Psychiatric:         Mood and Affect: Mood normal.        LABORATORY:  CBC  Diff   Lab Results   Component Value Date/Time    WBC 6.7 10/21/2022 08:32 AM    HGB 15.2 10/21/2022 08:32 AM    HCT 43.5 10/21/2022 08:32 AM    PLTCNT 147 10/21/2022 08:32 AM    RBC 4.71 10/21/2022 08:32 AM    MCV 92.3 10/21/2022 08:32 AM    MCHC 34.9 10/21/2022 08:32 AM    MCH 32.2 10/21/2022 08:32 AM    RDW 13.1 10/21/2022 08:32 AM    MPV 9.7 10/21/2022 08:32 AM    Lab Results   Component Value Date/Time    PMNS 60 10/21/2022 08:32 AM    LYMPHOCYTES 25 10/21/2022 08:32 AM    EOSINOPHIL 3 10/21/2022 08:32 AM    MONOCYTES 11 10/21/2022 08:32 AM    BASOPHILS 1 10/21/2022 08:32 AM    BASOPHILS 0.10 10/21/2022 08:32 AM    PMNABS 4.10 10/21/2022 08:32 AM    LYMPHSABS 1.70 10/21/2022 08:32 AM    EOSABS 0.20 10/21/2022 08:32 AM    MONOSABS 0.70 10/21/2022 08:32 AM            Comprehensive Metabolic Profile    Lab Results   Component Value Date    SODIUM 138 10/21/2022    POTASSIUM 4.3 10/21/2022    CHLORIDE 105 10/21/2022    CO2 23 10/21/2022    ANIONGAP 10 10/21/2022    BUN 24 10/21/2022    CREATININE 1.25 10/21/2022    ALBUMIN 4.4 10/21/2022    CALCIUM 10.0 10/21/2022    GLUCOSENF 139 (H) 10/21/2022    ALKPHOS 42 10/21/2022    ALT 26 10/21/2022    AST 21 10/21/2022    TOTBILIRUBIN 0.6 10/21/2022    TOTALPROTEIN 7.2 10/21/2022         ESTIMATED GFR   Date Value Ref Range Status   10/21/2022 62 >59 mL/min/1.28m^2 Final       IRON   Date Value Ref Range Status   10/21/2022 105 50 - 212 ug/dL Final     FERRITIN   Date Value Ref Range Status   10/21/2022 23 11 - 336 ng/mL Final     TOTAL IRON BINDING CAPACITY   Date Value Ref Range Status   10/21/2022 357 250 - 450  ug/dL Final     UIBC   Date Value Ref Range Status   10/21/2022 252 130 - 375 ug/dL Final     IRON (TRANSFERRIN) SATURATION   Date Value Ref Range Status   10/21/2022 29 20 - 50 % Final     VITAMIN B 12   Date Value Ref Range Status   10/14/2021 720  180 - 914 pg/mL Final       IMAGING:    07/23/22 CT Chest   IMPRESSION:  Clear lungs   Lower esophageal wall thickening raises the suspicion for esophagitis.    07/23/2022 CT Abdomen and Pelvis   Nonspecific fluid distended loops of small bowel in the mid and lower abdomen. Findings could reflect ileus in the setting of infection or early partial small bowel obstruction, though no discrete transition point is identified.   Lower esophageal wall thickening may be correlated for signs and symptoms of esophageal reflux.     07/24/22 KUB- A nonobstructive bowel gas pattern is noted     07/30/2022 KUB NO ACUTE FINDINGS     08/06/2022 X-ray T Spine- MILD DEGENERATIVE DISC DISEASE.           ASSESSMENT:   1.  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  Last therapeutic phlebotomy 1-2 years ago, per patient. Will check again in 6 months     PLAN:  All relative external and internal medical records were reviewed including available H&Ps, progress notes, procedure notes, imaging's, laboratories, and pathology    1. Hereditary Hemochromatosis- stable with normal iron levels we will re-evaluate iron stores in 6 months.   2. RTC 6 months with CBC, iron panel. Day of visit     The patient was given the opportunity to ask questions, and these were answered to their satisfaction. They are welcome to call with any questions or concerns at any point.      On the day of the encounter, I spent a total of  24 minutes on this patient encounter including review of historical information, examination, documentation and post-visit activities.       Evie Lacks APRN, FNP-C 6/7/202409:59

## 2022-10-23 NOTE — Patient Instructions (Signed)
Labs in office in 6 months

## 2022-11-17 ENCOUNTER — Ambulatory Visit (INDEPENDENT_AMBULATORY_CARE_PROVIDER_SITE_OTHER): Payer: Medicare Other | Admitting: Surgery

## 2022-11-17 ENCOUNTER — Encounter (INDEPENDENT_AMBULATORY_CARE_PROVIDER_SITE_OTHER): Payer: Self-pay | Admitting: Surgery

## 2022-11-17 ENCOUNTER — Other Ambulatory Visit: Payer: Self-pay

## 2022-11-17 VITALS — BP 138/81 | HR 86 | Temp 98.4°F | Resp 18 | Ht 68.0 in | Wt 199.0 lb

## 2022-11-17 DIAGNOSIS — Z1211 Encounter for screening for malignant neoplasm of colon: Secondary | ICD-10-CM

## 2022-11-17 DIAGNOSIS — R933 Abnormal findings on diagnostic imaging of other parts of digestive tract: Secondary | ICD-10-CM

## 2022-11-17 DIAGNOSIS — R1319 Other dysphagia: Secondary | ICD-10-CM

## 2022-11-17 MED ORDER — SUTAB 1.479-0.188-0.225 GRAM TABLET
ORAL_TABLET | ORAL | 0 refills | Status: DC
Start: 2022-11-17 — End: 2022-11-25

## 2022-11-17 NOTE — Progress Notes (Deleted)
A fun Because you left me course and I blood in his so showed the time bag have

## 2022-11-17 NOTE — Progress Notes (Signed)
GENERAL SURGERY, [facility] GENERAL SURGERY  [address]  [place] [address]    History and Physical    Name: Steven Henderson MRN:  [id number]   Date: 11/17/2022 DOB:  1951-04-18 (72 y.o.)              Reason for Visit: Colonoscopy and EGD    History of Present Illness  Mr. Klomp presents today for colonoscopy and EGD because of dysphagia and screening colonoscopy.  The patient states that he wakes up in the mornings with nausea and has noticeable difficulty swallowing solid foods.  The patient was seen in the emergency room in March at which time a CT scan of the abdomen was performed which showed circumferential lower esophageal wall thickening and diverticulosis with no evidence of diverticulitis.  CT scan of the chest likewise showed the same findings of lower esophageal wall thickening raising the suspicion for esophagitis.    Positive diabetes  Negative blood thinner, family history colon cancer      Review of the result(s) of each unique test:  Patient underwent diagnostic testing ( as above) prior to this dates visit.  I have personally reviewed the results and that serves as a component of the medical decision making for this encounter       Review of prior external note(s) from each unique source:  Patients referral to this office including a recent assessment by the referring provider.  This was reviewed by me for this unique office visit for the indication and intent of the referral as well as any pertinent medical or surgical history relevant to the patients independent evaluation by me today.      Patient History  Past Medical History:   Diagnosis Date    Anxiety state     BPH (benign prostatic hyperplasia)     Depression, acute     Diabetes mellitus, type 2 (CMS HCC)     Elevated PSA     Generalized OA     Hemochromatosis     Hyperlipidemia     Hypertension     Hypomagnesemia     Insomnia     Testicular hypofunction     Vitamin D deficiency          Past Surgical History:    Procedure Laterality Date    CYSTOSCOPY  06/23/2017    HX APPENDECTOMY           Current Outpatient Medications   Medication Sig    albuterol sulfate (PROVENTIL OR VENTOLIN OR PROAIR) 90 mcg/actuation Inhalation oral inhaler Take 1-2 Puffs by inhalation Every 6 hours as needed    amLODIPine (NORVASC) 10 mg Oral Tablet TAKE (1) TABLET DAILY    aspirin (ECOTRIN) 81 mg Oral Tablet, Delayed Release (E.C.) Take 1 Tablet (81 mg total) by mouth Once a day    finasteride (PROSCAR) 5 mg Oral Tablet Take 1 Tablet (5 mg total) by mouth Once a day    glimepiride (AMARYL) 4 mg Oral Tablet Take 1 Tablet (4 mg total) by mouth Twice daily (Patient taking differently: Take 1 Tablet (4 mg total) by mouth Every evening)    hydroCHLOROthiazide (HYDRODIURIL) 25 mg Oral Tablet Take 1 Tablet (25 mg total) by mouth Once a day    meloxicam (MOBIC) 15 mg Oral Tablet TAKE (1) TABLET DAILY WITH FOOD. (Patient taking differently: Take 1 Tablet (15 mg total) by mouth Once a day)    MetFORMIN (GLUCOPHAGE) 1,000 mg Oral Tablet Take 1 Tablet (1,000 mg  total) by mouth Twice daily with food    methocarbamoL (ROBAXIN) 500 mg Oral Tablet TAKE (1) TABLET FOUR TIMES DAILY. (Patient taking differently: Take 1 Tablet (500 mg total) by mouth Four times a day TAKE (1) TABLET FOUR TIMES DAILY.)    rosuvastatin (CRESTOR) 10 mg Oral Tablet Take 1 Tablet (10 mg total) by mouth Every evening    sod sulf-pot chloride-mag sulf (SUTAB) 1.479-0.188- 0.225 gram Oral Tablet Take 12 pills at 10-10:30 am. Take 12 pills at 6-6:30pm. Drink plenty of fluids all day/evening.    tamsulosin (FLOMAX) 0.4 mg Oral Capsule Take 1 Capsule (0.4 mg total) by mouth Every evening after dinner    telmisartan (MICARDIS) 80 mg Oral Tablet Take 1 Tablet (80 mg total) by mouth Once a day     No Known Allergies  Family Medical History:       Problem Relation (Age of Onset)    Liver Cancer Father    Stroke Mother            Social History     Tobacco Use    Smoking status: Never     Smokeless tobacco: Never   Vaping Use    Vaping status: Never Used   Substance Use Topics    Alcohol use: Not Currently    Drug use: Never            Physical Examination:  Vitals:    11/17/22 1042   BP: 138/81   Pulse: 86   Resp: 18   Temp: 36.9 C (98.4 F)   SpO2: 98%   Weight: 90.3 kg (199 lb)   Height: 1.727 m (5\' 8" )   BMI: 30.32        General: appropriate for age. in no acute distress.    Vital signs are present above and have been reviewed by me     HEENT: Atraumatic, Normocephalic. PERRLA. EOMI. Nose clear. Throat clear    Lungs: Nonlabored breathing with symmetric expansion. Clear to auscultation bilaterally    Heart:Regular wth respect to rate and rythmn.    Abdomen:Soft. Nontender. Nondistended and benign    Extremities: Grossly normal. No major deformities     Neuro:  Grossly normal motor and sensory function    Psychiatric: Alert and oriented to person, place, and time. affect appropriate      Assessment and Plan  EGD and colonoscopy scheduled for 11/25/2022 at 12:30 p.m.    Discussed indications, risks and benefits of esophagogastroduodenoscopy and colonoscopy with the patient.  Discussed the possibility of polypectomy, biopsies, and possible repeat examinations.  Risks include bleeding, sedation risks, possibility of missed diagnosis of polyp or malignancy, and remote possibilities of perforation and death.  All questions were answered, and informed consent was clearly obtained.      Follow Up:  No follow-ups on file.      ICD-10-CM    1. Esophageal dysphagia  R13.19       2. Encounter for screening colonoscopy  Z12.11       3. Abnormal CT scan, gastrointestinal tract  R93.3           Carrianne Hyun B Nathanuel Cabreja, MD ,MBA,FACS    I appreciate the opportunity to be involved in the care of your patients.  If you have any questions or concerns regarding this encounter, please do not hesitate to contact me at your convenience.      This note may have been partially generated using MModal Fluency Direct system, and  there may be some  incorrect words, spellings, and punctuation that were not noted in checking the note before saving, though effort was made to avoid such errors.

## 2022-11-23 ENCOUNTER — Other Ambulatory Visit (INDEPENDENT_AMBULATORY_CARE_PROVIDER_SITE_OTHER): Payer: Self-pay | Admitting: Surgery

## 2022-11-23 MED ORDER — PEG 3350-ELECTROLYTES 236 GRAM-22.74 GRAM-6.74 GRAM-5.86 GRAM SOLUTION
4.0000 L | Freq: Once | ORAL | 0 refills | Status: DC
Start: 2022-11-23 — End: 2022-11-25

## 2022-11-25 ENCOUNTER — Encounter (HOSPITAL_COMMUNITY): Payer: Medicare Other | Admitting: Surgery

## 2022-11-25 ENCOUNTER — Encounter (HOSPITAL_COMMUNITY)
Admission: RE | Disposition: A | Discharge status: Home / Self Care | Payer: Self-pay | Source: Ambulatory Visit | Attending: Surgery

## 2022-11-25 ENCOUNTER — Other Ambulatory Visit: Payer: Self-pay

## 2022-11-25 ENCOUNTER — Inpatient Hospital Stay
Admission: RE | Admit: 2022-11-25 | Discharge: 2022-11-25 | Disposition: A | Discharge status: Home / Self Care | Payer: Medicare Other | Source: Ambulatory Visit | Attending: Surgery | Admitting: Surgery

## 2022-11-25 ENCOUNTER — Ambulatory Visit (HOSPITAL_COMMUNITY): Payer: Medicare Other | Admitting: Certified Registered"

## 2022-11-25 ENCOUNTER — Encounter (HOSPITAL_COMMUNITY): Payer: Self-pay | Admitting: Surgery

## 2022-11-25 DIAGNOSIS — I10 Essential (primary) hypertension: Secondary | ICD-10-CM | POA: Insufficient documentation

## 2022-11-25 DIAGNOSIS — K641 Second degree hemorrhoids: Secondary | ICD-10-CM | POA: Insufficient documentation

## 2022-11-25 DIAGNOSIS — K769 Liver disease, unspecified: Secondary | ICD-10-CM | POA: Insufficient documentation

## 2022-11-25 DIAGNOSIS — R1310 Dysphagia, unspecified: Secondary | ICD-10-CM | POA: Insufficient documentation

## 2022-11-25 DIAGNOSIS — E119 Type 2 diabetes mellitus without complications: Secondary | ICD-10-CM | POA: Insufficient documentation

## 2022-11-25 DIAGNOSIS — K2950 Unspecified chronic gastritis without bleeding: Secondary | ICD-10-CM

## 2022-11-25 DIAGNOSIS — Z7984 Long term (current) use of oral hypoglycemic drugs: Secondary | ICD-10-CM | POA: Insufficient documentation

## 2022-11-25 DIAGNOSIS — Z1211 Encounter for screening for malignant neoplasm of colon: Secondary | ICD-10-CM

## 2022-11-25 DIAGNOSIS — K209 Esophagitis, unspecified without bleeding: Secondary | ICD-10-CM | POA: Insufficient documentation

## 2022-11-25 DIAGNOSIS — K5730 Diverticulosis of large intestine without perforation or abscess without bleeding: Secondary | ICD-10-CM | POA: Insufficient documentation

## 2022-11-25 SURGERY — GASTROSCOPY WITH BIOPSY
Anesthesia: General | Wound class: Clean Contaminated Wounds-The respiratory, GI, Genital, or urinary

## 2022-11-25 MED ORDER — PROPOFOL 10 MG/ML IV BOLUS
INJECTION | Freq: Once | INTRAVENOUS | Status: DC | PRN
Start: 2022-11-25 — End: 2022-11-25
  Administered 2022-11-25: 100 mg via INTRAVENOUS
  Administered 2022-11-25: 50 mg via INTRAVENOUS
  Administered 2022-11-25: 100 mg via INTRAVENOUS

## 2022-11-25 MED ORDER — DEXTROSE 5 % AND LACTATED RINGERS INTRAVENOUS SOLUTION
INTRAVENOUS | Status: DC
Start: 2022-11-25 — End: 2022-11-25
  Administered 2022-11-25: 0 via INTRAVENOUS

## 2022-11-25 MED ORDER — BETHANECHOL CHLORIDE 10 MG TABLET
10.0000 mg | ORAL_TABLET | Freq: Three times a day (TID) | ORAL | 3 refills | Status: DC
Start: 2022-11-25 — End: 2023-02-19

## 2022-11-25 MED ORDER — LIDOCAINE (PF) 100 MG/5 ML (2 %) INTRAVENOUS SYRINGE
INJECTION | Freq: Once | INTRAVENOUS | Status: DC | PRN
Start: 2022-11-25 — End: 2022-11-25
  Administered 2022-11-25: 100 mg via INTRAVENOUS

## 2022-11-25 SURGICAL SUPPLY — 4 items
DETERGENT INSTR 22OZ TRNSPT GEL RINSE FREE NEUT PH PREKLENZ CLR PLSNT LF (MISCELLANEOUS PT CARE ITEMS) ×1 IMPLANT
FORCEPS BIOPSY MICROMESH TTH STREAMLINE CATH NEEDLE 240CM 2.4MM RJ 4 SS LRG CPC STRL DISP ORNG 2.8MM (ENDOSCOPIC SUPPLIES) ×1 IMPLANT
USE ITEM 60432 FORCEPS BIOPSY MICROMESH TTH STREAMLINE CATH NEEDLE 240CM 2.4MM RJ 4 SS LRG CPC STRL DISP ORNG 2.8MM (ENDOSCOPIC SUPPLIES) ×1 IMPLANT
VALVE AIR/H20 DEFENDO BUTTON KIT SUCT BIOPSY STRL DISP (ENDOSCOPIC SUPPLIES) ×1 IMPLANT

## 2022-11-25 NOTE — OR Surgeon (Signed)
[facility]      Patient Name: Steven, Henderson Number: [id number]  Date of Service: 11/25/2022   Date of Birth: 03/17/1951      Pre-Operative Diagnosis: Dysphagia  Colon Screening     Post-Operative Diagnosis: Mild gastritis  Grade B esophagitis  Mild sigmoid diverticulosis  Grade two hemorrhoids    Procedure(s)/Description:  EGD WITH BIOPSY: 43239 (CPT)  COLONOSCOPY: 54098 (CPT)     Attending Surgeon: Fidela Juneau, MD     Anesthesia:  CRNA: Magdalene Patricia, CRNA    Anesthesia Type: .General     Specimens Removed:   ID Type Source Tests Collected by Time Destination   1 : gastric antrum biopsy x1 Tissue Gastric SURGICAL PATHOLOGY SPECIMEN Gabrial Poppell, Cherylann Parr, MD 11/25/2022 1333      Order Name Source Comment Collection Info Order Time   SURGICAL PATHOLOGY SPECIMEN Gastric Pre-op diagnosis:  Dysphagia  Colon Screening Collected By: Fidela Juneau, MD 11/25/2022  1:34 PM     Release to patient   Automated            The patient indicates that they have read and understood the preoperative EGD with biopsy and colonoscopy consent form. The benefits, risks and alternatives to the procedure were discussed. I specifically discussed the risk of bleeding and/or perforation requiring operation.The patient indicates they have no further question and wish to proceed. Informed consent was obtained from the patient and/or medical power of attorney.  The patient was brought in to the endoscopy suite and placed on the stretcher in the left lateral decubitus position. The video gastroscope was then inserted into the mouth, down the esophagus and into the stomach after adequate IV and topical anesthetic was provided. The stomach was insufflated with air and examination of the stomach was performed. Antral biopsy was obtained and sent to the Pathology department for microscopic examination. Hemostasis was well obtained.  The scope was advanced to the pyloric channel.  The pylorus was cannulated and the  scope was advanced into the duodenum without any difficulty. Examination of the first and second portions of the duodenum was performed and the findings were noted as above. The scope was withdrawn back into the stomach in which an extensive examination was performed with the above noted findings. Retroflexion of the scope was performed which gave good visualization of the proximal stomach and GE junction from below. The scope was pulled back up into the proximal stomach and GE junction, with the above noted findings.  The scope was withdrawn back up into the esophagus and out the mouth without any difficulty. The patient tolerated the procedure well. No complications were encountered.   There were no unplanned events.  EKG, pulse, pulse oximetry and blood pressure were monitored throughout the entire procedure.  The patient was positioned on the stretcher in the left lateral decubitus position. After IV sedation was given, full finger digital rectal examination was performed with a circumferential sweep of the distal rectal mucosa. Subsequently, the flexible colonoscope was inserted into the rectum and passed without any difficulty. The colonoscope was then advanced up into the sigmoid colon, descending colon, transverse colon, right colon and cecum without any difficulty. Gross examination of each section of the colon was performed. Cecal intubation was achieved and the appendiceal orifice and ileocecal valve were identified. The operative findings were noted as described above. The colonoscope was withdrawn carefully examining the mucosa as the scope was being extracted with particular attention paid to the proximal sides  of folds, flexures, bends and rectal valves. At approximately 10 cm. from the anal verge, the colonoscope was retroflexed to fully examine the distal rectum. The colonoscope was removed and a repeat digital rectal examination was performed at the completion of the procedure. The patient  tolerated the procedure well. No intraoperative complications were encountered.  EKG, pulse, pulse oximetry and blood pressure were monitored throughout the entire procedure.  There were no unplanned events.    The patient was instructed to contact me if they have any problems with their stomach and/or colon such as nausea, vomiting, bleeding, pain or changes in bowel habits. They understood and agreed to do so.    The patient was dysphagia symptoms are more in the pharyngeal region and this may be in part secondary to his history of diabetes which can cause GI motility issues.  I will put the patient on bethanechol 3 times a day to see if this helps him.  I told him after 4-6 weeks of taking this medication if he notices no improvement then I would stop it.    The patient will not need another screening colonoscopy for 10 years which is according to ASGE guidelines. However, if in the future the patient has any problems with abdominal pain, changes in bowel habits, blood in stool, etc., then they should contact me because they may be a candidate for diagnostic colonoscopy before the 10 year time limit.  Steven Henderson B. Steven Cale, MD, MBA, FACS  [facility] -General Surgery

## 2022-11-25 NOTE — Anesthesia Postprocedure Evaluation (Signed)
Anesthesia Post Op Evaluation    Patient: Steven Henderson  Procedure(s):  EGD WITH BIOPSY  COLONOSCOPY    Last Vitals:Temperature: 36.8 C (98.3 F) (11/25/22 1229)  Heart Rate: 82 (11/25/22 1229)  BP (Non-Invasive): (!) 145/76 (11/25/22 1229)  Respiratory Rate: 20 (11/25/22 1229)  SpO2: 95 % (11/25/22 1229)    No notable events documented.    Patient is sufficiently recovered from the effects of anesthesia to participate in the evaluation and has returned to their pre-procedure level.  Patient location during evaluation: PACU       Patient participation: complete - patient participated  Level of consciousness: awake and alert and responsive to verbal stimuli    Pain management: adequate  Airway patency: patent    Anesthetic complications: no  Cardiovascular status: acceptable  Respiratory status: acceptable  Hydration status: acceptable  Patient post-procedure temperature: Pt Normothermic   PONV Status: Absent

## 2022-11-25 NOTE — Anesthesia Preprocedure Evaluation (Signed)
ANESTHESIA PRE-OP EVALUATION  Planned Procedure: EGD WITH BIOPSY  COLONOSCOPY  Review of Systems     anesthesia history negative     patient summary reviewed  nursing notes reviewed        Pulmonary  negative pulmonary ROS,    Cardiovascular    Hypertension, well controlled and ECG reviewed , Exercise Tolerance: > or = 4 METS        GI/Hepatic/Renal    liver disease        Endo/Other    drug induced coagulopathy,   type 2 diabetes    Neuro/Psych/MS   negative neuro/psych ROS,      Cancer    negative hematology/oncology ROS,               Physical Assessment      Airway       Mallampati: III                  Dental       Dentition intact             Pulmonary    Breath sounds clear to auscultation       Cardiovascular    Rhythm: regular  Rate: Normal       Other findings          Plan  ASA 3     Planned anesthesia type: general     general intravenous                    Intravenous induction       Anesthetic plan and risks discussed with patient  signed consent obtained          Patient's NPO status is appropriate for Anesthesia.

## 2022-11-25 NOTE — Discharge Instructions (Addendum)
SURGICAL DISCHARGE INSTRUCTIONS     Dr. Donnal Debar, Gene B, MD  performed your EGD WITH BIOPSY, COLONOSCOPY today at the [facility]    [facility]:  Monday through Friday from 8 a.m. - 4 p.m.: [phone]    For T&D: [phone]  Between 4 p.m. - 8 a.m., weekends and holidays:  Call ER [phone]    PLEASE SEE WRITTEN HANDOUTS AS DISCUSSED BY YOUR NURSE:  Victorino Dike, RN    ANESTHESIA INFORMATION   ANESTHESIA -- ADULT PATIENTS:  You have received intravenous sedation / general anesthesia, and you may feel drowsy and light-headed for several hours. You may even experience some forgetfulness of the procedure. DO NOT DRIVE A MOTOR VEHICLE or perform any activity requiring complete alertness or coordination until you feel fully awake in about 24-48 hours. Do not drink alcoholic beverages for at least 24 hours. Do not stay alone, you must have a responsible adult available to be with you. You may also experience a dry mouth or nausea for 24 hours. This is a normal side effect and will disappear as the effects of the medication wear off.    REMEMBER   If you experience any difficulty breathing, chest pain, bleeding that you feel is excessive, persistent nausea or vomiting or for any other concerns:  Call your physician Dr.  Donnal Debar, Cherylann Parr, MD   at [phone] . You may also ask to have the general doctor on call paged. They are available to you 24 hours a day.    SPECIAL INSTRUCTIONS / COMMENTS   Today's findings:   EGD: Mild gastritis,Grade B esophagitis  Colonoscopy: Mild sigmoid diverticulosis, Grade two hemorrhoids   New Prescription for Bethanechol. Take that in addition to your other medication.   Plan for a repeat Colonoscopy in 10 years.     FOLLOW-UP APPOINTMENTS   Please call your surgeon's office at the number listed to schedule a date / time of return for follow-up as needed.      Dr Stephanie Acre  [phone]

## 2022-11-25 NOTE — H&P (Signed)
[facility]  General Surgery  History and Physical    Date of Service:  11/25/2022  Steven, Henderson, 72 y.o. male  Date of Admission:  11/25/2022  Date of Birth:  1950/08/22  PCP: Lucia Gaskins, DO    Reason for admission:  EGD and colonoscopy    HPI:  Steven Henderson is a 72 y.o. White male who is admitted for Dysphagia  Colon Screening     Steven Henderson presents today for colonoscopy and EGD because of dysphagia and screening colonoscopy.  The patient states that he wakes up in the mornings with nausea and has noticeable difficulty swallowing solid foods.  The patient was seen in the emergency room in March at which time a CT scan of the abdomen was performed which showed circumferential lower esophageal wall thickening and diverticulosis with no evidence of diverticulitis.  CT scan of the chest likewise showed the same findings of lower esophageal wall thickening raising the suspicion for esophagitis.     Positive diabetes  Negative blood thinner, family history colon cancer        Review of the result(s) of each unique test:  Patient underwent diagnostic testing ( as above) prior to this dates visit.  I have personally reviewed the results and that serves as a component of the medical decision making for this encounter        Review of prior external note(s) from each unique source:  Patients referral to this office including a recent assessment by the referring provider.  This was reviewed by me for this unique office visit for the indication and intent of the referral as well as any pertinent medical or surgical history relevant to the patients independent evaluation by me today.    Past Medical History:   Diagnosis Date    Anxiety state     BPH (benign prostatic hyperplasia)     Depression, acute     Diabetes mellitus, type 2 (CMS HCC)     Elevated PSA     Generalized OA     Hemochromatosis     Hyperlipidemia     Hypertension     Hypomagnesemia     Insomnia     Testicular hypofunction     Vitamin D  deficiency       Past Surgical History:   Procedure Laterality Date    CYSTOSCOPY  06/23/2017    HX APPENDECTOMY        Social History     Tobacco Use    Smoking status: Never    Smokeless tobacco: Never   Vaping Use    Vaping status: Never Used   Substance Use Topics    Alcohol use: Not Currently    Drug use: Never       Family Medical History:       Problem Relation (Age of Onset)    Liver Cancer Father    Stroke Mother           Medications Prior to Admission       Prescriptions    albuterol sulfate (PROVENTIL OR VENTOLIN OR PROAIR) 90 mcg/actuation Inhalation oral inhaler    Take 1-2 Puffs by inhalation Every 6 hours as needed    amLODIPine (NORVASC) 10 mg Oral Tablet    TAKE (1) TABLET DAILY    aspirin (ECOTRIN) 81 mg Oral Tablet, Delayed Release (E.C.)    Take 1 Tablet (81 mg total) by mouth Once a day    finasteride (PROSCAR) 5 mg Oral Tablet  Take 1 Tablet (5 mg total) by mouth Once a day    glimepiride (AMARYL) 4 mg Oral Tablet    Take 1 Tablet (4 mg total) by mouth Twice daily    Patient taking differently:  Take 1 Tablet (4 mg total) by mouth Every evening    hydroCHLOROthiazide (HYDRODIURIL) 25 mg Oral Tablet    Take 1 Tablet (25 mg total) by mouth Once a day    meloxicam (MOBIC) 15 mg Oral Tablet    TAKE (1) TABLET DAILY WITH FOOD.    Patient taking differently:  Take 1 Tablet (15 mg total) by mouth Once a day    MetFORMIN (GLUCOPHAGE) 1,000 mg Oral Tablet    Take 1 Tablet (1,000 mg total) by mouth Twice daily with food    methocarbamoL (ROBAXIN) 500 mg Oral Tablet    TAKE (1) TABLET FOUR TIMES DAILY.    Patient taking differently:  Take 1 Tablet (500 mg total) by mouth Four times a day TAKE (1) TABLET FOUR TIMES DAILY.    PEG 3350-Electrolytes 236-22.74-6.74 -5.86 gram Oral Recon Soln    Take 4,000 mL by mouth One time for 1 dose    rosuvastatin (CRESTOR) 10 mg Oral Tablet    Take 1 Tablet (10 mg total) by mouth Every evening    sod sulf-pot chloride-mag sulf (SUTAB) 1.479-0.188- 0.225 gram Oral  Tablet    Take 12 pills at 10-10:30 am. Take 12 pills at 6-6:30pm. Drink plenty of fluids all day/evening.    tamsulosin (FLOMAX) 0.4 mg Oral Capsule    Take 1 Capsule (0.4 mg total) by mouth Every evening after dinner    telmisartan (MICARDIS) 80 mg Oral Tablet    Take 1 Tablet (80 mg total) by mouth Once a day           No Known Allergies       No data found.       General: appropriate for age. in no acute distress.    Vital signs are present above and have been reviewed by me     HEENT: Atraumatic, Normocephalic. PERRLA, EOMI. Nose clear. Throat clear.    Lungs: Nonlabored breathing with symmetric expansion.  Clear to auscultation bilaterally    Heart:Regular wth respect to rate and rythmn.    Abdomen:Soft. Nontender. Nondistended and benign    Extremities:  Grossly normal with good range of motion and no major deformities.    Neuro:  Grossly normal motor and sensory function. CN's II through XII intact.    Psychiatric: Alert and oriented to person, place, and time. affect appropriate    Laboratory Data:     No results found for any visits on 11/25/22 (from the past 24 hour(s)).    Imaging Studies:    No orders to display        Assessment/Plan:  Dysphagia  Colon Screening    EGD and colonoscopy scheduled for Wednesday November 25, 2022    Discussed indications, risks and benefits of esophagogastroduodenoscopy and colonoscopy with the patient.  Discussed the possibility of polypectomy, biopsies, and possible repeat examinations.  Risks include bleeding, sedation risks, possibility of missed diagnosis of polyp or malignancy, and remote possibilities of perforation and death.  All questions were answered, and informed consent was clearly obtained.    This note was partially created using voice recognition software and is inherently subject to errors including those of syntax and "sound alike " substitutions which may escape proof reading. In such instances, original meaning may be  extrapolated by contextual  derivation.    Fidela Juneau, MD, MBA, FACS

## 2022-11-26 LAB — SURGICAL PATHOLOGY SPECIMEN

## 2022-11-27 ENCOUNTER — Other Ambulatory Visit (INDEPENDENT_AMBULATORY_CARE_PROVIDER_SITE_OTHER): Payer: Self-pay | Admitting: Surgery

## 2022-12-07 ENCOUNTER — Encounter (INDEPENDENT_AMBULATORY_CARE_PROVIDER_SITE_OTHER): Payer: Self-pay | Admitting: Internal Medicine

## 2022-12-12 ENCOUNTER — Other Ambulatory Visit (INDEPENDENT_AMBULATORY_CARE_PROVIDER_SITE_OTHER): Payer: Self-pay | Admitting: Internal Medicine

## 2022-12-15 ENCOUNTER — Encounter (INDEPENDENT_AMBULATORY_CARE_PROVIDER_SITE_OTHER): Payer: Medicare Other | Admitting: Internal Medicine

## 2023-01-04 ENCOUNTER — Other Ambulatory Visit: Payer: Self-pay

## 2023-01-04 ENCOUNTER — Other Ambulatory Visit: Payer: Medicare Other | Attending: Internal Medicine | Admitting: Internal Medicine

## 2023-01-04 ENCOUNTER — Ambulatory Visit (INDEPENDENT_AMBULATORY_CARE_PROVIDER_SITE_OTHER): Payer: Medicare Other

## 2023-01-04 DIAGNOSIS — E1142 Type 2 diabetes mellitus with diabetic polyneuropathy: Secondary | ICD-10-CM | POA: Insufficient documentation

## 2023-01-04 DIAGNOSIS — R338 Other retention of urine: Secondary | ICD-10-CM | POA: Insufficient documentation

## 2023-01-04 DIAGNOSIS — E782 Mixed hyperlipidemia: Secondary | ICD-10-CM

## 2023-01-04 DIAGNOSIS — I10 Essential (primary) hypertension: Secondary | ICD-10-CM

## 2023-01-04 DIAGNOSIS — R5382 Chronic fatigue, unspecified: Secondary | ICD-10-CM | POA: Insufficient documentation

## 2023-01-04 DIAGNOSIS — N401 Enlarged prostate with lower urinary tract symptoms: Secondary | ICD-10-CM | POA: Insufficient documentation

## 2023-01-04 LAB — POCT URINE DIPSTICK
BLOOD: NEGATIVE
GLUCOSE: NEGATIVE
KETONE: 15 — AB
LEUKOCYTES: NEGATIVE
NITRITE: NEGATIVE
PH: 5.5
PROTEIN: 30 — AB
SPECIFIC GRAVITY: >=1.03
UROBILINOGEN: 0.2

## 2023-01-04 LAB — POCT MICROALBUMIN/CREATININE RATIO, URINE
CREATININE, UR RAND: 300 mg/dL
MICROALBUMIN RANDOM URINE: 30 mg/L
MICROALBUMIN/CREAT RATIO: <30 mg/g

## 2023-01-04 NOTE — Progress Notes (Signed)
Patient came in today for Lab Only, Venipuncture completed with no complications, patient discharged with no issues.Jonelle Sidle, MA  01/04/2023 11:58

## 2023-01-04 NOTE — Addendum Note (Signed)
Addended by: Magdalene Molly on: 01/04/2023 03:31 PM     Modules accepted: Orders

## 2023-01-06 ENCOUNTER — Other Ambulatory Visit: Payer: Self-pay

## 2023-01-06 ENCOUNTER — Other Ambulatory Visit (INDEPENDENT_AMBULATORY_CARE_PROVIDER_SITE_OTHER): Payer: Self-pay | Admitting: Internal Medicine

## 2023-01-06 ENCOUNTER — Encounter (INDEPENDENT_AMBULATORY_CARE_PROVIDER_SITE_OTHER): Payer: Self-pay | Admitting: Internal Medicine

## 2023-01-06 ENCOUNTER — Ambulatory Visit (INDEPENDENT_AMBULATORY_CARE_PROVIDER_SITE_OTHER): Payer: Medicare Other | Admitting: Internal Medicine

## 2023-01-06 VITALS — BP 138/77 | HR 78 | Resp 18 | Ht 68.0 in | Wt 208.0 lb

## 2023-01-06 DIAGNOSIS — E119 Type 2 diabetes mellitus without complications: Secondary | ICD-10-CM

## 2023-01-06 DIAGNOSIS — E1142 Type 2 diabetes mellitus with diabetic polyneuropathy: Secondary | ICD-10-CM

## 2023-01-06 DIAGNOSIS — E1159 Type 2 diabetes mellitus with other circulatory complications: Secondary | ICD-10-CM

## 2023-01-06 DIAGNOSIS — R5382 Chronic fatigue, unspecified: Secondary | ICD-10-CM

## 2023-01-06 DIAGNOSIS — Z7984 Long term (current) use of oral hypoglycemic drugs: Secondary | ICD-10-CM

## 2023-01-06 DIAGNOSIS — E782 Mixed hyperlipidemia: Secondary | ICD-10-CM | POA: Insufficient documentation

## 2023-01-06 LAB — COMPREHENSIVE METABOLIC PANEL, NON-FASTING
ALBUMIN/GLOBULIN RATIO: 1.5 — ABNORMAL HIGH (ref 0.8–1.4)
ALBUMIN: 4.3 g/dL (ref 3.5–5.7)
ALKALINE PHOSPHATASE: 50 U/L (ref 34–104)
ALT (SGPT): 39 U/L (ref 7–52)
ANION GAP: 8 mmol/L (ref 4–13)
AST (SGOT): 29 U/L (ref 13–39)
BILIRUBIN TOTAL: 0.7 mg/dL (ref 0.3–1.0)
BUN/CREA RATIO: 17 (ref 6–22)
BUN: 21 mg/dL (ref 7–25)
CALCIUM, CORRECTED: 9.6 mg/dL (ref 8.9–10.8)
CALCIUM: 9.8 mg/dL (ref 8.6–10.3)
CHLORIDE: 103 mmol/L (ref 98–107)
CO2 TOTAL: 25 mmol/L (ref 21–31)
CREATININE: 1.23 mg/dL (ref 0.60–1.30)
ESTIMATED GFR: 63 mL/min/{1.73_m2} (ref >59)
GLOBULIN: 2.8 — ABNORMAL LOW (ref 2.9–5.4)
GLUCOSE: 263 mg/dL — ABNORMAL HIGH (ref 74–109)
OSMOLALITY, CALCULATED: 284 mOsm/kg (ref 270–290)
POTASSIUM: 4.4 mmol/L (ref 3.5–5.1)
PROTEIN TOTAL: 7.1 g/dL (ref 6.4–8.9)
SODIUM: 136 mmol/L (ref 136–145)

## 2023-01-06 LAB — IRON TRANSFERRIN AND TIBC
IRON (TRANSFERRIN) SATURATION: 39 % (ref 20–50)
IRON: 135 ug/dL (ref 50–212)
TOTAL IRON BINDING CAPACITY: 346 ug/dL (ref 250–450)
TRANSFERRIN: 247 mg/dL (ref 203–362)
UIBC: 211 ug/dL (ref 130–375)

## 2023-01-06 LAB — CBC
HCT: 43.1 % (ref 36.7–47.1)
HGB: 14.8 g/dL (ref 12.5–16.3)
MCH: 32.5 pg (ref 23.8–33.4)
MCHC: 34.2 g/dL (ref 32.5–36.3)
MCV: 95 fL (ref 73.0–96.2)
MPV: 11 fL (ref 7.4–11.4)
PLATELETS: 149 10*3/uL (ref 140–440)
RBC: 4.53 10*6/uL (ref 4.06–5.63)
RDW: 13.1 % (ref 12.1–16.2)
WBC: 8.7 10*3/uL (ref 3.6–10.2)

## 2023-01-06 LAB — FERRITIN: FERRITIN: 26 ng/mL (ref 11–336)

## 2023-01-06 LAB — THYROID STIMULATING HORMONE WITH FREE T4 REFLEX: TSH: 1.89 u[IU]/mL (ref 0.450–5.330)

## 2023-01-06 LAB — LIPID PANEL
CHOL/HDL RATIO: 2.3
CHOLESTEROL: 110 mg/dL (ref <200)
HDL CHOL: 48 mg/dL (ref >=40)
LDL CALC: 38 mg/dL (ref 0–100)
TRIGLYCERIDES: 121 mg/dL (ref <=150)
VLDL CALC: 24 mg/dL (ref 0–50)

## 2023-01-06 LAB — HGA1C (HEMOGLOBIN A1C WITH EST AVG GLUCOSE): HEMOGLOBIN A1C: 7.2 % — ABNORMAL HIGH (ref 4.0–6.0)

## 2023-01-06 LAB — PSA, DIAGNOSTIC: PSA: 1.52 ng/mL (ref <=4.00)

## 2023-01-06 MED ORDER — GLIMEPIRIDE 4 MG TABLET
4.0000 mg | ORAL_TABLET | Freq: Two times a day (BID) | ORAL | 3 refills | Status: AC
Start: 2023-01-06 — End: ?

## 2023-01-06 MED ORDER — AMLODIPINE 10 MG TABLET
10.0000 mg | ORAL_TABLET | Freq: Every day | ORAL | 3 refills | Status: DC
Start: 2023-01-06 — End: 2023-02-12

## 2023-01-06 NOTE — Progress Notes (Signed)
INTERNAL MEDICINE, [facility]  [address]  [place] [address]       Name: Steven Henderson MRN:  [id number]   Date: 01/06/2023 Age: 72 y.o.       Chief Complaint:    Chief Complaint   Patient presents with    Follow Up     For chronic disease management.           HPI:  Steven Henderson is a 72 y.o. male who presents to the office today via follow-up of their diabetic control. Patient is actually doing relatively well and except for minor issues as described below which I have addressed in total with the patient.    Past Medical History:  Past Medical History:   Diagnosis Date    Anxiety state     BPH (benign prostatic hyperplasia)     Depression, acute     Diabetes mellitus, type 2 (CMS HCC)     Elevated PSA     Generalized OA     Hemochromatosis     Hyperlipidemia     Hypertension     Hypomagnesemia     Insomnia     Testicular hypofunction     Vitamin D deficiency          Past Surgical History:   Procedure Laterality Date    CYSTOSCOPY  06/23/2017    FINGER SURGERY Left     RING FINGER    HAND SURGERY Right     HX APPENDECTOMY        Current Outpatient Medications   Medication Sig    albuterol sulfate (PROVENTIL OR VENTOLIN OR PROAIR) 90 mcg/actuation Inhalation oral inhaler Take 1-2 Puffs by inhalation Every 6 hours as needed    amLODIPine (NORVASC) 10 mg Oral Tablet TAKE (1) TABLET DAILY    aspirin (ECOTRIN) 81 mg Oral Tablet, Delayed Release (E.C.) Take 1 Tablet (81 mg total) by mouth Once a day    bethanechol chloride (URECHOLINE) 10 mg Oral Tablet Take 1 Tablet (10 mg total) by mouth Three times a day    finasteride (PROSCAR) 5 mg Oral Tablet Take 1 Tablet (5 mg total) by mouth Once a day    glimepiride (AMARYL) 4 mg Oral Tablet Take 1 Tablet (4 mg total) by mouth Twice daily    hydroCHLOROthiazide (HYDRODIURIL) 25 mg Oral Tablet Take 1 Tablet (25 mg total) by mouth Once a day    meloxicam (MOBIC) 15 mg Oral Tablet TAKE (1) TABLET DAILY WITH FOOD. (Patient taking differently: Take 1  Tablet (15 mg total) by mouth Once a day)    MetFORMIN (GLUCOPHAGE) 1,000 mg Oral Tablet Take 1 Tablet (1,000 mg total) by mouth Twice daily with food    methocarbamoL (ROBAXIN) 500 mg Oral Tablet TAKE (1) TABLET FOUR TIMES DAILY. (Patient taking differently: Take 1 Tablet (500 mg total) by mouth Four times a day TAKE (1) TABLET FOUR TIMES DAILY PRN)    rosuvastatin (CRESTOR) 10 mg Oral Tablet Take 1 Tablet (10 mg total) by mouth Every evening    tamsulosin (FLOMAX) 0.4 mg Oral Capsule Take 1 Capsule (0.4 mg total) by mouth Every evening after dinner    telmisartan (MICARDIS) 80 mg Oral Tablet Take 1 Tablet (80 mg total) by mouth Once a day     No Known Allergies    Family History:  Family Medical History:       Problem Relation (Age of Onset)    Liver Cancer Father    Stroke  Mother              Social History:   Social History     Tobacco Use   Smoking Status Never   Smokeless Tobacco Never     Social History     Substance and Sexual Activity   Alcohol Use Not Currently     Social History     Occupational History    Not on file       Review of Systems:  Review of systems as discussed in HPI      Problem List:  Patient Active Problem List   Diagnosis    Anxiety state    BPH (benign prostatic hyperplasia)    Depression, acute    Diabetes mellitus, type 2 (CMS HCC)    Elevated PSA    Generalized OA    Hypomagnesemia    Insomnia    Major depression, recurrent (CMS HCC)    Chronic bilateral thoracic back pain    Hypertension associated with type 2 diabetes mellitus (CMS HCC)  (CMS HCC)    Fatigue    Hereditary hemochromatosis (CMS HCC)    Dupuytren's contracture of both hands    Ileus (CMS HCC)    Nausea, vomiting and diarrhea    Abdominal pain    Chronic cough    Partial small bowel obstruction (CMS HCC)    Dehydration    Nausea and vomiting, unspecified vomiting type    Colon cancer screening    Scapulothoracic syndrome    Chronic right shoulder pain    DM type 2 with diabetic mixed hyperlipidemia (CMS HCC)  (CMS HCC)        Physical Examination:  BP 138/77 (Site: Left Arm, Patient Position: Sitting, Cuff Size: Adult)   Pulse 78   Resp 18   Ht 1.727 m (5\' 8" )   Wt 94.3 kg (208 lb)   SpO2 97%   BMI 31.63 kg/m       Physical Exam  Vitals and nursing note reviewed.   Constitutional:       General: He is not in acute distress.     Appearance: Normal appearance.   HENT:      Head: Normocephalic.      Right Ear: Tympanic membrane normal.      Left Ear: Tympanic membrane normal.      Nose: Nose normal.      Mouth/Throat:      Mouth: Mucous membranes are moist.   Eyes:      General: No scleral icterus.     Extraocular Movements: Extraocular movements intact.      Conjunctiva/sclera: Conjunctivae normal.      Pupils: Pupils are equal, round, and reactive to light.   Neck:      Vascular: No carotid bruit.   Cardiovascular:      Rate and Rhythm: Normal rate and regular rhythm.      Pulses: Normal pulses.           Dorsalis pedis pulses are 2+ on the right side and 2+ on the left side.        Posterior tibial pulses are 2+ on the right side and 2+ on the left side.      Heart sounds: Normal heart sounds.   Pulmonary:      Effort: Pulmonary effort is normal.      Breath sounds: Normal breath sounds. No wheezing, rhonchi or rales.   Abdominal:      General: Abdomen is flat. Bowel sounds are  normal.      Tenderness: There is no abdominal tenderness. There is no guarding.   Musculoskeletal:         General: No swelling. Normal range of motion.      Cervical back: Normal range of motion. No tenderness.      Right lower leg: No edema.      Left lower leg: No edema.   Skin:     General: Skin is warm.      Capillary Refill: Capillary refill takes less than 2 seconds.      Findings: Ecchymosis present.   Neurological:      General: No focal deficit present.      Mental Status: He is alert and oriented to person, place, and time.      Cranial Nerves: No cranial nerve deficit.            Health Maintenance:  Health Maintenance   Topic Date Due     Diabetic A1C  11/06/2022    Diabetic Retinal Exam  Never done    Influenza Vaccine (1) 01/17/2023    Medicare Annual Wellness Visit  09/07/2023    Diabetic Kidney Health eGFR  10/21/2023    Diabetic Kidney Health Microalb/Cr Ratio  01/04/2024    Colonoscopy  11/24/2032    Pneumococcal Vaccination, Age 73+  Completed    Meningococcal Vaccine  Aged Out    Adult Tdap-Td  Discontinued    Hepatitis C screening  Discontinued    Shingles Vaccine  Discontinued    Covid-19 Vaccine  Discontinued    Prostate Cancer Screening  Discontinued        Assessment/Plan:      ICD-10-CM    1. Hypertension associated with type 2 diabetes mellitus (CMS HCC)  (CMS HCC)  E11.59     I15.2       2. Type 2 diabetes mellitus with diabetic polyneuropathy, without long-term current use of insulin (CMS HCC)  E11.42       3. DM type 2 with diabetic mixed hyperlipidemia (CMS HCC)  (CMS HCC)  E11.69     E78.2       4. Chronic fatigue  R53.82       5. Hereditary hemochromatosis (CMS HCC)  E83.110          No orders of the defined types were placed in this encounter.    There are no discontinued medications.                    Follow up:  No follow-ups on file.    This note was partially created using voice recognition software and is inherently subject to errors including those of syntax and "sound alike " substitutions which may escape proof reading.  In such instances, original meaning may be extrapolated by contextual derivation.    Lucia Gaskins, DO  01/06/2023, 10:26

## 2023-02-11 ENCOUNTER — Observation Stay (HOSPITAL_COMMUNITY): Payer: Medicare Other | Admitting: Internal Medicine

## 2023-02-11 ENCOUNTER — Other Ambulatory Visit: Payer: Self-pay

## 2023-02-11 ENCOUNTER — Observation Stay (HOSPITAL_COMMUNITY): Payer: Medicare Other

## 2023-02-11 ENCOUNTER — Observation Stay (HOSPITAL_BASED_OUTPATIENT_CLINIC_OR_DEPARTMENT_OTHER): Payer: Medicare Other

## 2023-02-11 ENCOUNTER — Observation Stay
Admission: EM | Admit: 2023-02-11 | Discharge: 2023-02-12 | Disposition: A | Discharge status: Home / Self Care | Payer: Medicare Other | Source: Home / Self Care | Attending: Emergency Medicine | Admitting: Emergency Medicine

## 2023-02-11 ENCOUNTER — Emergency Department (HOSPITAL_COMMUNITY): Payer: Medicare Other

## 2023-02-11 ENCOUNTER — Encounter (HOSPITAL_COMMUNITY): Payer: Self-pay

## 2023-02-11 DIAGNOSIS — Z7984 Long term (current) use of oral hypoglycemic drugs: Secondary | ICD-10-CM | POA: Insufficient documentation

## 2023-02-11 DIAGNOSIS — Z1152 Encounter for screening for COVID-19: Secondary | ICD-10-CM | POA: Insufficient documentation

## 2023-02-11 DIAGNOSIS — I10 Essential (primary) hypertension: Secondary | ICD-10-CM | POA: Diagnosis present

## 2023-02-11 DIAGNOSIS — R42 Dizziness and giddiness: Secondary | ICD-10-CM

## 2023-02-11 DIAGNOSIS — R519 Headache, unspecified: Secondary | ICD-10-CM | POA: Insufficient documentation

## 2023-02-11 DIAGNOSIS — R4781 Slurred speech: Secondary | ICD-10-CM | POA: Insufficient documentation

## 2023-02-11 DIAGNOSIS — R55 Syncope and collapse: Principal | ICD-10-CM | POA: Diagnosis present

## 2023-02-11 DIAGNOSIS — E1165 Type 2 diabetes mellitus with hyperglycemia: Secondary | ICD-10-CM

## 2023-02-11 DIAGNOSIS — Z794 Long term (current) use of insulin: Secondary | ICD-10-CM | POA: Insufficient documentation

## 2023-02-11 DIAGNOSIS — E119 Type 2 diabetes mellitus without complications: Secondary | ICD-10-CM | POA: Diagnosis present

## 2023-02-11 DIAGNOSIS — N400 Enlarged prostate without lower urinary tract symptoms: Secondary | ICD-10-CM | POA: Insufficient documentation

## 2023-02-11 DIAGNOSIS — H11009 Unspecified pterygium of unspecified eye: Secondary | ICD-10-CM | POA: Insufficient documentation

## 2023-02-11 DIAGNOSIS — Z823 Family history of stroke: Secondary | ICD-10-CM

## 2023-02-11 LAB — COVID-19, FLU A/B, RSV RAPID BY PCR
INFLUENZA VIRUS TYPE A: NOT DETECTED
INFLUENZA VIRUS TYPE B: NOT DETECTED
RESPIRATORY SYNCTIAL VIRUS (RSV): NOT DETECTED
SARS-CoV-2: NOT DETECTED

## 2023-02-11 LAB — URINALYSIS, MACROSCOPIC
BILIRUBIN: NEGATIVE mg/dL
BLOOD: NEGATIVE mg/dL
GLUCOSE: 50 mg/dL — AB
KETONES: NEGATIVE mg/dL
LEUKOCYTES: NEGATIVE WBCs/uL
NITRITE: NEGATIVE
PH: 5.5 (ref 5.0–9.0)
PROTEIN: 10 mg/dL
SPECIFIC GRAVITY: 1.022 (ref 1.002–1.030)
UROBILINOGEN: NORMAL mg/dL

## 2023-02-11 LAB — COMPREHENSIVE METABOLIC PANEL, NON-FASTING
ALBUMIN/GLOBULIN RATIO: 1.3 (ref 0.8–1.4)
ALBUMIN: 4 g/dL (ref 3.5–5.7)
ALKALINE PHOSPHATASE: 47 U/L (ref 34–104)
ALT (SGPT): 36 U/L (ref 7–52)
ANION GAP: 11 mmol/L (ref 4–13)
AST (SGOT): 28 U/L (ref 13–39)
BILIRUBIN TOTAL: 0.8 mg/dL (ref 0.3–1.0)
BUN/CREA RATIO: 17 (ref 6–22)
BUN: 21 mg/dL (ref 7–25)
CALCIUM, CORRECTED: 9.6 mg/dL (ref 8.9–10.8)
CALCIUM: 9.6 mg/dL (ref 8.6–10.3)
CHLORIDE: 103 mmol/L (ref 98–107)
CO2 TOTAL: 21 mmol/L (ref 21–31)
CREATININE: 1.23 mg/dL (ref 0.60–1.30)
ESTIMATED GFR: 63 mL/min/{1.73_m2} (ref >59)
GLOBULIN: 3 (ref 2.9–5.4)
GLUCOSE: 237 mg/dL — ABNORMAL HIGH (ref 74–109)
OSMOLALITY, CALCULATED: 281 mosm/kg (ref 270–290)
POTASSIUM: 3.7 mmol/L (ref 3.5–5.1)
PROTEIN TOTAL: 7 g/dL (ref 6.4–8.9)
SODIUM: 135 mmol/L — ABNORMAL LOW (ref 136–145)

## 2023-02-11 LAB — D-DIMER: D-DIMER: 451 ng{FEU}/mL (ref 215–500)

## 2023-02-11 LAB — ARTERIAL BLOOD GAS/LACTATE
%FIO2 (ARTERIAL): 21 %
BASE DEFICIT: 4.7 mmol/L — ABNORMAL HIGH (ref 0.0–2.0)
BICARBONATE (ARTERIAL): 21.3 mmol/L (ref 20.0–26.0)
CARBOXYHEMOGLOBIN: 1.5 % (ref <=1.5)
LACTATE: 2.9 mmol/L — ABNORMAL HIGH (ref <=2.0)
MET-HEMOGLOBIN: 0.6 % (ref <=2.0)
O2CT: 19.8 %
OXYHEMOGLOBIN: 94.9 % (ref 88.0–100.0)
PCO2 (ARTERIAL): 32 mm[Hg] — ABNORMAL LOW (ref 35–45)
PH (ARTERIAL): 7.4 (ref 7.35–7.45)
PO2 (ARTERIAL): 78 mm[Hg] — ABNORMAL LOW (ref 80–100)

## 2023-02-11 LAB — TROPONIN-I
TROPONIN I: 4 ng/L (ref <20)
TROPONIN I: 4 ng/L (ref <20)
TROPONIN I: 5 ng/L (ref <20)

## 2023-02-11 LAB — URINALYSIS, MICROSCOPIC
NON-SQUAMOUS EPITHELIAL CELLS URINE: <1 /[HPF] (ref <=1)
RBCS: 1 /[HPF] (ref <4)
WBCS: 1 /[HPF] (ref <6)

## 2023-02-11 LAB — LACTIC ACID - SECOND REFLEX: LACTIC ACID: 1.6 mmol/L (ref 0.5–2.2)

## 2023-02-11 LAB — CBC WITH DIFF
BASOPHIL #: 0 10*3/uL (ref 0.00–0.10)
BASOPHIL %: 0 % (ref 0–1)
EOSINOPHIL #: 0.1 10*3/uL (ref 0.00–0.50)
EOSINOPHIL %: 1 % (ref 1–8)
HCT: 42.8 % (ref 36.7–47.1)
HGB: 14.9 g/dL (ref 12.5–16.3)
LYMPHOCYTE #: 1.2 10*3/uL (ref 1.00–3.00)
LYMPHOCYTE %: 18 % (ref 16–44)
MCH: 32.5 pg (ref 23.8–33.4)
MCHC: 34.7 g/dL (ref 32.5–36.3)
MCV: 93.6 fL (ref 73.0–96.2)
MONOCYTE #: 0.6 10*3/uL (ref 0.30–1.00)
MONOCYTE %: 8 % (ref 5–13)
MPV: 10.1 fL (ref 7.4–11.4)
NEUTROPHIL #: 5.1 10*3/uL (ref 1.85–7.80)
NEUTROPHIL %: 73 % (ref 43–77)
PLATELETS: 148 10*3/uL (ref 140–440)
RBC: 4.58 10*6/uL (ref 4.06–5.63)
RDW: 13.1 % (ref 12.1–16.2)
WBC: 7 10*3/uL (ref 3.6–10.2)

## 2023-02-11 LAB — LACTIC ACID - FIRST REFLEX: LACTIC ACID: 2.1 mmol/L (ref 0.5–2.2)

## 2023-02-11 LAB — POC BLOOD GLUCOSE (RESULTS)
GLUCOSE, POC: 153 mg/dL — ABNORMAL HIGH (ref 70–100)
GLUCOSE, POC: 97 mg/dL (ref 70–100)
GLUCOSE, POC: 154 mg/dL — ABNORMAL HIGH (ref 70–100)

## 2023-02-11 LAB — ETHANOL, SERUM/PLASMA: ETHANOL: <10 mg/dL

## 2023-02-11 LAB — THYROID STIMULATING HORMONE WITH FREE T4 REFLEX: TSH: 3.072 u[IU]/mL (ref 0.450–5.330)

## 2023-02-11 LAB — GRAY TOP TUBE

## 2023-02-11 LAB — CREATINE KINASE (CK), TOTAL, SERUM OR PLASMA: CREATINE KINASE: 264 U/L — ABNORMAL HIGH (ref 30–223)

## 2023-02-11 MED ORDER — IPRATROPIUM 0.5 MG-ALBUTEROL 3 MG (2.5 MG BASE)/3 ML NEBULIZATION SOLN
3.0000 mL | INHALATION_SOLUTION | RESPIRATORY_TRACT | Status: DC | PRN
Start: 2023-02-11 — End: 2023-02-12

## 2023-02-11 MED ORDER — ONDANSETRON HCL (PF) 4 MG/2 ML INJECTION SOLUTION
4.0000 mg | Freq: Four times a day (QID) | INTRAMUSCULAR | Status: DC | PRN
Start: 2023-02-11 — End: 2023-02-12

## 2023-02-11 MED ORDER — ACETAMINOPHEN 325 MG TABLET
650.0000 mg | ORAL_TABLET | ORAL | Status: DC | PRN
Start: 2023-02-11 — End: 2023-02-12
  Administered 2023-02-11: 650 mg via ORAL
  Filled 2023-02-11: qty 2

## 2023-02-11 MED ORDER — GLUCAGON 1 MG/ML SOLUTION FOR INJECTION
1.0000 mg | Freq: Once | INTRAMUSCULAR | Status: DC | PRN
Start: 2023-02-11 — End: 2023-02-12

## 2023-02-11 MED ORDER — ATORVASTATIN 10 MG TABLET
20.0000 mg | ORAL_TABLET | Freq: Every evening | ORAL | Status: DC
Start: 2023-02-11 — End: 2023-02-12
  Administered 2023-02-11: 20 mg via ORAL
  Filled 2023-02-11: qty 2

## 2023-02-11 MED ORDER — TAMSULOSIN 0.4 MG CAPSULE
0.4000 mg | ORAL_CAPSULE | Freq: Every evening | ORAL | Status: DC
Start: 2023-02-11 — End: 2023-02-12
  Administered 2023-02-11: 0.4 mg via ORAL
  Filled 2023-02-11: qty 1

## 2023-02-11 MED ORDER — IOHEXOL 350 MG IODINE/ML INTRAVENOUS SOLUTION
75.0000 mL | INTRAVENOUS | Status: AC
Start: 2023-02-11 — End: 2023-02-11
  Administered 2023-02-11: 75 mL via INTRAVENOUS

## 2023-02-11 MED ORDER — DEXTROSE 40 % ORAL GEL
15.0000 g | ORAL | Status: DC | PRN
Start: 2023-02-11 — End: 2023-02-12

## 2023-02-11 MED ORDER — SODIUM CHLORIDE 0.9 % INTRAVENOUS SOLUTION
INTRAVENOUS | Status: DC
Start: 2023-02-11 — End: 2023-02-12

## 2023-02-11 MED ORDER — ASPIRIN 81 MG CHEWABLE TABLET
CHEWABLE_TABLET | ORAL | Status: AC
Start: 2023-02-11 — End: 2023-02-11
  Filled 2023-02-11: qty 1

## 2023-02-11 MED ORDER — DEXTROSE 50 % IN WATER (D50W) INTRAVENOUS SYRINGE
12.5000 g | INJECTION | INTRAVENOUS | Status: DC | PRN
Start: 2023-02-11 — End: 2023-02-12

## 2023-02-11 MED ORDER — INSULIN LISPRO 100 UNIT/ML SUB-Q SSIP VIAL
2.0000 [IU] | INJECTION | Freq: Four times a day (QID) | SUBCUTANEOUS | Status: DC
Start: 2023-02-11 — End: 2023-02-12
  Administered 2023-02-11 – 2023-02-12 (×4): 0 [IU] via SUBCUTANEOUS
  Filled 2023-02-11: qty 2

## 2023-02-11 MED ORDER — BETHANECHOL CHLORIDE 25 MG TABLET
6.2500 mg | ORAL_TABLET | Freq: Three times a day (TID) | ORAL | Status: DC
Start: 2023-02-11 — End: 2023-02-12
  Administered 2023-02-11 – 2023-02-12 (×3): 6.25 mg via ORAL
  Filled 2023-02-11 (×2): qty 1
  Filled 2023-02-11 (×5): qty 0.25

## 2023-02-11 MED ORDER — ASPIRIN 81 MG CHEWABLE TABLET
81.0000 mg | CHEWABLE_TABLET | Freq: Every day | ORAL | Status: DC
Start: 2023-02-11 — End: 2023-02-12
  Administered 2023-02-11 – 2023-02-12 (×2): 81 mg via ORAL
  Filled 2023-02-11: qty 1

## 2023-02-11 MED ORDER — SODIUM CHLORIDE 0.9 % INJECTION SOLUTION
2.0000 mL | Freq: Once | INTRAVENOUS | Status: DC | PRN
Start: 2023-02-11 — End: 2023-02-12
  Administered 2023-02-11: 4 mL via INTRAVENOUS

## 2023-02-11 MED ORDER — FINASTERIDE 5 MG TABLET
5.0000 mg | ORAL_TABLET | Freq: Every day | ORAL | Status: DC
Start: 2023-02-11 — End: 2023-02-12
  Administered 2023-02-11 – 2023-02-12 (×2): 5 mg via ORAL
  Filled 2023-02-11 (×4): qty 1

## 2023-02-11 MED ORDER — ENOXAPARIN 40 MG/0.4 ML SUBCUTANEOUS SYRINGE
40.0000 mg | INJECTION | SUBCUTANEOUS | Status: DC
Start: 2023-02-11 — End: 2023-02-12
  Administered 2023-02-11: 40 mg via SUBCUTANEOUS
  Filled 2023-02-11: qty 0.4

## 2023-02-11 NOTE — ED Triage Notes (Signed)
Syncopal episode for approx 1 min, slurred speech upon awakening for a few minutes. Pt was assisted to ground - no injuries reported.    PRS: BS 228, 20g right ac, monitor

## 2023-02-11 NOTE — ED Nurses Note (Signed)
Patient received to room 4   with complaints of syncopal episode this morning. Patient denies any pain at this time. Patient reports dizziness prior to syncopal episode. Patient placed on monitor, VSS, assessment and history completed. Call light in hand, encouraged to call for any needs. Awaiting providers orders at this time.

## 2023-02-11 NOTE — ED Nurses Note (Signed)
Report called to floor

## 2023-02-11 NOTE — ED Provider Notes (Signed)
[facility]  ED Primary Provider Note  Patient Name: Steven Henderson  Patient Age: 72 y.o.  Date of Birth: [birth year]    Chief Complaint: Syncope        History of Present Illness       Steven Henderson is a 72 y.o. male who had concerns including Syncope.  This patient is a 72 year old male who presents after a witnessed syncopal episode by a co-worker.  The patient works at [employer], and was walking through a door, became lightheaded, and syncopized without any obvious trauma reported.  He states he is prescribed a blood thinner, but is unclear of the medication.  He has history of hypertension, and diabetes.        Review of Systems     No other overt Review of Systems are noted to be positive except noted in the HPI.      Historical Data   History Reviewed This Encounter: Medical History  Surgical History  Family History  Social History      Physical Exam   ED Triage Vitals [02/11/23 0907]   BP (Non-Invasive) 125/70   Heart Rate 92   Respiratory Rate 16   Temperature 36.4 C (97.6 F)   SpO2 95 %   Weight 95.3 kg (210 lb)   Height 1.727 m (5\' 8" )         Nursing notes reviewed for what could be assessed. Past Medical, Surgical, and Social history reviewed for what has been completed.    Constitutional: NAD. Well-Developed. Well Nourished.  Head: Normocephalic, atraumatic.  Mouth/Throat:  Symmetric facial movement.  Eyes: EOM grossly intact, conjunctiva normal.  Patient has a pterygium noted.  Neck: Supple  Cardiovascular: Regular Rate and Rhythm, extremities well perfused.  Pulmonary/Chest: No respiratory distress. Lungs are symmetric to auscultation bilaterally.  Abdominal: Soft, non-tender, non-distended. Non peritoneal, no rebound, no guarding.  MSK: No Lower Extremity Edema.  Skin: Warm, dry, and intact  Neuro: Appropriate, CN II-XII grossly intact.   Psych: Pleasant              Procedures      Patient Data     Labs Ordered/Reviewed   COMPREHENSIVE METABOLIC PANEL,  NON-FASTING - Abnormal; Notable for the following components:       Result Value    SODIUM 135 (*)     GLUCOSE 237 (*)     All other components within normal limits    Narrative:     Estimated Glomerular Filtration Rate (eGFR) is calculated using the CKD-EPI (2021) equation, intended for patients 25 years of age and older. If gender is not documented or "unknown", there will be no eGFR calculation.     URINALYSIS, MACROSCOPIC - Abnormal; Notable for the following components:    GLUCOSE 50 (*)     All other components within normal limits   ARTERIAL BLOOD GAS/LACTATE - Abnormal; Notable for the following components:    PCO2 (ARTERIAL) 32 (*)     LACTATE 2.9 (*)     PO2 (ARTERIAL) 78 (*)     BASE DEFICIT 4.7 (*)     All other components within normal limits   ETHANOL, SERUM/PLASMA - Normal   TROPONIN-I - Normal   TROPONIN-I - Normal   TROPONIN-I - Normal   THYROID STIMULATING HORMONE WITH FREE T4 REFLEX - Normal   D-DIMER - Normal    Narrative:     D-Dimers are reported in FEU per ng/mL.    IF PATIENT  IS EXHIBITING SYMPTOMS DVT/PE, THIS D-DIMER RESULT MAY INDICATE A NEED FOR FURTHER TESTING FOR THESE CONDITIONS. IF PATIENT IS SUSPECTED OF DIC AND SYMPTOMS WORSEN OR PERSIST, A REPEAT DIC WORKUP SHOULD BE CONSIDERED.    NOTE: ALTHOUGH THE NORMAL RANGE FOR THIS TEST IS 215-500 ng/mL FEU, LITERATURE RECOMMENDS FURTHER TESTING FOR ANY RESULT >500ng/mL FEU.    A cut off value of 500ng/mL FEU or below can be used as an aid in the diagnosis of Thromboembolism when used in conjunction with the patient's medical history, clinical presentation and other findings. Results of 500ng/mL FEU or below have a negative predictive value of 100%.     CBC WITH DIFF - Normal   URINALYSIS, MICROSCOPIC - Normal   LACTIC ACID - FIRST REFLEX - Normal   LACTIC ACID - SECOND REFLEX - Normal   COVID-19, FLU A/B, RSV RAPID BY PCR - Normal    Narrative:     Results are for the simultaneous qualitative identification of SARS-CoV-2 (formerly  2019-nCoV), Influenza A, Influenza B, and RSV RNA. These etiologic agents are generally detectable in nasopharyngeal and nasal swabs during the ACUTE PHASE of infection. Hence, this test is intended to be performed on respiratory specimens collected from individuals with signs and symptoms of upper respiratory tract infection who meet Centers for Disease Control and Prevention (CDC) clinical and/or epidemiological criteria for Coronavirus Disease 2019 (COVID-19) testing. CDC COVID-19 criteria for testing on human specimens is available at [facility] webpage information for Healthcare Professionals: Coronavirus Disease 2019 (COVID-19) ([employer]).     False-negative results may occur if the virus has genomic mutations, insertions, deletions, or rearrangements or if performed very early in the course of illness. Otherwise, negative results indicate virus specific RNA targets are not detected, however negative results do not preclude SARS-CoV-2 infection/COVID-19, Influenza, or Respiratory syncytial virus infection. Results should not be used as the sole basis for patient management decisions. Negative results must be combined with clinical observations, patient history, and epidemiological information. If upper respiratory tract infection is still suspected based on exposure history together with other clinical findings, re-testing should be considered.    Test methodology:   Cepheid Xpert Xpress SARS-CoV-2/Flu/RSV Assay real-time polymerase chain reaction (RT-PCR) test on the GeneXpert Dx and Xpert Xpress systems.   CBC/DIFF    Narrative:     The following orders were created for panel order CBC/DIFF.  Procedure                               Abnormality         Status                     ---------                               -----------         ------                     CBC WITH NWGN[[id number]]                Normal              Final result                 Please view  results for these tests on the individual orders.   URINALYSIS, MACROSCOPIC AND MICROSCOPIC W/CULTURE REFLEX  Narrative:     The following orders were created for panel order URINALYSIS, MACROSCOPIC AND MICROSCOPIC W/CULTURE REFLEX.  Procedure                               Abnormality         Status                     ---------                               -----------         ------                     URINALYSIS, MACROSCOPIC[[id number]]      Abnormal            Final result               URINALYSIS, MICROSCOPIC[[id number]]      Normal              Final result                 Please view results for these tests on the individual orders.   EXTRA TUBES    Narrative:     The following orders were created for panel order EXTRA TUBES.  Procedure                               Abnormality         Status                     ---------                               -----------         ------                     Josepha Pigg                                    Final result                 Please view results for these tests on the individual orders.   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 NO RECENT INFARCT IDENTIFIED.            Radiologist location ID: [id number]         CT ANGIO INTRA-EXTRA CRANIAL W IV CONTRAST   Final Result by Edi, Radresults In (09/26 1359)   1. RIGHT CAROTID: NO SIGNIFICANT STENOSIS   2. LEFT CAROTID: LESS THAN 50% STENOSIS   3. VERTEBRALS: PATENT   4. INTRACRANIAL: NO LARGE VESSEL OCCLUSION, HIGH-GRADE STENOSIS, OR ANEURYSM.      One or more dose reduction techniques were used (e.g., Automated exposure control, adjustment of the mA and/or kV according to patient size, use of  iterative reconstruction technique).         Radiologist location ID: [id number]         CT  BRAIN WO IV CONTRAST   Final Result by Edi, Radresults In (09/26 8119)   NO ACUTE FINDINGS         One or more dose reduction techniques were used (e.g., Automated exposure control, adjustment of the mA and/or kV according to patient size, use of iterative reconstruction technique).         Radiologist location ID: [id number]         XR CHEST AP AND LATERAL   Final Result by Edi, Radresults In (09/26 0936)   NO ACUTE FINDINGS.         Radiologist location ID: [id number]             Medical Decision Making          Medical Decision Making          Studies Assessed:  Lab, EKG, radiology    EKG:   This EKG interpreted by me shows:    Rate:  81 beats per minute    Interpretation:  PR 162, No consistent ST Elevation, No Acute STEMI Identified.      MDM Narrative:  This patient is a 19 old male who presents after a syncopal episode with a brief episode of unresponsiveness thereafter.  The patient awoke with slurred speech, and progressively improved while in the emergency department.  CT head was obtained with no acute abnormality.  Patient was D-dimer is not elevated.  Differential does include cardiac dysrhythmia, vasovagal episode, metabolic abnormality.  After follow up discussion, shared decision-making, the patient was agreeable to inpatient evaluation.        Differential includes but not limited to:  Cardiac dysrhythmia: Although not identified, this is concerning given the patient's age, syncopal episode.  Hospitalist was contacted for admission evaluation.  PE: Unlikely setting and normal D-dimer.      Follow-Up Discussion:  Patient was updated.    Please Henderson documentation above for specific labs and radiology.      Decision for High Risk/High Medical Decision Making and Treatment in the ED:  Decision of hospitalization or escalation of hospital-level care.                              Medications Administered in the ED       Patient will be admitted to the  service for further workup and  management.    Disposition: Admitted             Based upon the clinical setting, the likely diagnosis/impression include:    Clinical Impression   Syncope, unspecified syncope type (Primary)   Headache         Current Discharge Medication List            /R. Tobey Bride, MD, Lacie Scotts  Department of Emergency Medicine  [facility]

## 2023-02-11 NOTE — H&P (Signed)
[place] [facility]    HOSPITALIST H&P    Steven Henderson 72 y.o. male ED04/ED04   Date of Service: 02/11/2023    Date of Admission:  02/11/2023   PCP: Lucia Gaskins, DO Code Status:Prior       Chief Complaint: syncope    HPI:  Patient arrives in the ER today after witnessed syncopal episode at work at a local [employer].  Patient did not fall, co-worker was standing next to him and lowered him to the floor.  He states he prior to this episode, denies chest pain, palpitations, nausea vomiting or diarrhea.  He states that when he woke up he did feel like he was having difficulty breathing that subsided quickly.  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  He was on blood pressure medications and denies recent medication adjustments.  He states that he faithfully monitors his blood pressure at home and it usually runs 130s over 80s.  Blood pressure today is 123/64, heart rate 79 sinus rhythm, 97% saturation on room air.  CBC is unremarkable.  BNP does show elevated glucose at 2:37 a.m., otherwise unremarkable.  Head CT showed no acute findings.  Troponins x2 are 4.  Urine is unremarkable.  No reports of seizure activity or trauma.  He states that he has headaches right frontal area for the past several days and took Tylenol this morning.  He denies alcohol, drugs or smoking.      ED medications:   Medications Administered in the ED         PMHx:    Past Medical History:   Diagnosis Date    Anxiety state     BPH (benign prostatic hyperplasia)     Depression, acute     Diabetes mellitus, type 2 (CMS HCC)     Elevated PSA     Generalized OA     Hemochromatosis     Hyperlipidemia     Hypertension     Hypomagnesemia     Insomnia     Testicular hypofunction     Vitamin D deficiency     PSHx:   Past Surgical History:   Procedure Laterality Date    CYSTOSCOPY  06/23/2017    FINGER SURGERY Left     RING FINGER    HAND SURGERY Right     HX  APPENDECTOMY         Allergies:    No Known Allergies Social History  Social History     Tobacco Use    Smoking status: Never    Smokeless tobacco: Never   Vaping Use    Vaping status: Never Used   Substance Use Topics    Alcohol use: Not Currently    Drug use: Never       Family History  Family Medical History:       Problem Relation (Age of Onset)    Liver Cancer Father    Stroke Mother               Home Meds:      Prior to Admission medications    Medication Sig Start Date End Date Taking? Authorizing Provider   albuterol sulfate (PROVENTIL OR VENTOLIN OR PROAIR) 90 mcg/actuation Inhalation oral inhaler Take 1-2 Puffs by inhalation Every 6 hours as needed   Yes Provider, Historical   amLODIPine (NORVASC) 10 mg Oral Tablet Take 1 Tablet (10 mg total) by mouth Once a day 01/06/23  Yes Lacey Jensen A, DO   amLODIPine (NORVASC) 10 mg Oral Tablet TAKE (1) TABLET DAILY 01/06/23  Yes Lacey Jensen A, DO   aspirin (ECOTRIN) 81 mg Oral Tablet, Delayed Release (E.C.) Take 1 Tablet (81 mg total) by mouth Once a day   Yes Provider, Historical   bethanechol chloride (URECHOLINE) 10 mg Oral Tablet Take 1 Tablet (10 mg total) by mouth Three times a day 11/25/22  Yes Duremdes, Gene B, MD   finasteride (PROSCAR) 5 mg Oral Tablet Take 1 Tablet (5 mg total) by mouth Once a day 10/21/20  Yes Provider, Historical   glimepiride (AMARYL) 4 mg Oral Tablet Take 1 Tablet (4 mg total) by mouth Twice daily 01/06/23  Yes Lacey Jensen A, DO   hydroCHLOROthiazide (HYDRODIURIL) 25 mg Oral Tablet Take 1 Tablet (25 mg total) by mouth Once a day 09/07/22  Yes Katrinka Blazing, Todd A, DO   meloxicam (MOBIC) 15 mg Oral Tablet TAKE (1) TABLET DAILY WITH FOOD. 01/06/23  Yes Lacey Jensen A, DO   MetFORMIN (GLUCOPHAGE) 1,000 mg Oral Tablet Take 1 Tablet (1,000 mg total) by mouth Twice daily with food 07/28/22  Yes Smith, Todd A, DO   methocarbamoL (ROBAXIN) 500 mg Oral Tablet TAKE (1) TABLET FOUR TIMES DAILY.  Patient taking differently: Take 1 Tablet (500 mg total) by mouth  Four times a day TAKE (1) TABLET FOUR TIMES DAILY PRN 05/26/22   Lacey Jensen A, DO   rosuvastatin (CRESTOR) 10 mg Oral Tablet Take 1 Tablet (10 mg total) by mouth Every evening 09/07/22  Yes Lacey Jensen A, DO   tamsulosin (FLOMAX) 0.4 mg Oral Capsule Take 1 Capsule (0.4 mg total) by mouth Every evening after dinner 10/21/20  Yes Provider, Historical   telmisartan (MICARDIS) 80 mg Oral Tablet Take 1 Tablet (80 mg total) by mouth Once a day 09/07/22  Yes Smith, Todd A, DO          ROS:   General: No fever or chills. No weight changes, fatigue, weakness.   HEENT: No headaches, dizziness, changes in vision, changes in hearing, or difficulty swallowing.    Skin:  No rashes, erythema or bruises.   Cardiac: No chest pain, palpitations, or arrhythmia.    Respiratory: No shortness of breath, cough, or wheezing.  GI: No nausea or vomiting. No abdominal pain.   Urinary: No dysuria, hematuria, or change in frequency.    Vascular: No edema.     Musculoskeletal: No muscle weakness, pain, or decreased range of motion.   Neurologic: No loss of sensation, numbness or tingling.   Endocrine: No heat or cold intolerance or polydipsia.   Psychiatric: No insomnia, depression or anxiety.      Results for orders placed or performed during the hospital encounter of 02/11/23 (from the past 24 hour(s))   ECG 12 LEAD   Result Value Ref Range    Ventricular rate 81 BPM    Atrial Rate 81 BPM    PR Interval 162 ms    QRS Duration 98 ms    QT Interval 378 ms    QTC Calculation 439 ms    Calculated P Axis 42 degrees    Calculated R Axis 85 degrees    Calculated T Axis 10 degrees   COMPREHENSIVE METABOLIC PANEL, NON-FASTING   Result Value Ref Range    SODIUM 135 (L) 136 - 145 mmol/L    POTASSIUM 3.7 3.5 - 5.1 mmol/L    CHLORIDE 103 98 - 107 mmol/L    CO2  TOTAL 21 21 - 31 mmol/L    ANION GAP 11 4 - 13 mmol/L    BUN 21 7 - 25 mg/dL    CREATININE 0.86 5.78 - 1.30 mg/dL    BUN/CREA RATIO 17 6 - 22    ESTIMATED GFR 63 >59 mL/min/1.85m^2    ALBUMIN 4.0 3.5 -  5.7 g/dL    CALCIUM 9.6 8.6 - 46.9 mg/dL    GLUCOSE 629 (H) 74 - 109 mg/dL    ALKALINE PHOSPHATASE 47 34 - 104 U/L    ALT (SGPT) 36 7 - 52 U/L    AST (SGOT) 28 13 - 39 U/L    BILIRUBIN TOTAL 0.8 0.3 - 1.0 mg/dL    PROTEIN TOTAL 7.0 6.4 - 8.9 g/dL    ALBUMIN/GLOBULIN RATIO 1.3 0.8 - 1.4    OSMOLALITY, CALCULATED 281 270 - 290 mOsm/kg    CALCIUM, CORRECTED 9.6 8.9 - 10.8 mg/dL    GLOBULIN 3.0 2.9 - 5.4   ETHANOL, SERUM/PLASMA   Result Value Ref Range    ETHANOL <10 0 mg/dL   TROPONIN NOW   Result Value Ref Range    TROPONIN I 4 <20 ng/L   THYROID STIMULATING HORMONE WITH FREE T4 REFLEX   Result Value Ref Range    TSH 3.072 0.450 - 5.330 uIU/mL   D-DIMER   Result Value Ref Range    D-DIMER 451 215 - 500 ng/mL FEU   CBC WITH DIFF   Result Value Ref Range    WBC 7.0 3.6 - 10.2 x10^3/uL    RBC 4.58 4.06 - 5.63 x10^6/uL    HGB 14.9 12.5 - 16.3 g/dL    HCT 52.8 41.3 - 24.4 %    MCV 93.6 73.0 - 96.2 fL    MCH 32.5 23.8 - 33.4 pg    MCHC 34.7 32.5 - 36.3 g/dL    RDW 01.0 27.2 - 53.6 %    PLATELETS 148 140 - 440 x10^3/uL    MPV 10.1 7.4 - 11.4 fL    NEUTROPHIL % 73 43 - 77 %    LYMPHOCYTE % 18 16 - 44 %    MONOCYTE % 8 5 - 13 %    EOSINOPHIL % 1 1 - 8 %    BASOPHIL % 0 0 - 1 %    NEUTROPHIL # 5.10 1.85 - 7.80 x10^3/uL    LYMPHOCYTE # 1.20 1.00 - 3.00 x10^3/uL    MONOCYTE # 0.60 0.30 - 1.00 x10^3/uL    EOSINOPHIL # 0.10 0.00 - 0.50 x10^3/uL    BASOPHIL # 0.00 0.00 - 0.10 x10^3/uL   ARTERIAL BLOOD GAS/LACTATE   Result Value Ref Range    PH (ARTERIAL) 7.40 7.35 - 7.45    PCO2 (ARTERIAL) 32 (L) 35 - 45 mm/Hg    BICARBONATE (ARTERIAL) 21.3 20.0 - 26.0 mmol/L    MET-HEMOGLOBIN 0.6 <=2.0 %    LACTATE 2.9 (H) <=2.0 mmol/L    CARBOXYHEMOGLOBIN 1.5 <=1.5 %    O2CT 19.8 %    %FIO2 (ARTERIAL) 21 %    PO2 (ARTERIAL) 78 (L) 80 - 100 mm/Hg    OXYHEMOGLOBIN 94.9 88.0 - 100.0 %    ALLEN TEST yes     DRAW SITE left radial     BASE DEFICIT 4.7 (H) 0.0 - 2.0 mmol/L   GRAY TOP TUBE   Result Value Ref Range    RAINBOW/EXTRA TUBE AUTO RESULT Yes     TROPONIN IN ONE HOUR   Result Value Ref  Range    TROPONIN I 4 <20 ng/L   LACTIC ACID - FIRST REFLEX   Result Value Ref Range    LACTIC ACID 2.1 0.5 - 2.2 mmol/L   URINALYSIS, MACROSCOPIC   Result Value Ref Range    COLOR Light Yellow Colorless, Light Yellow, Yellow    APPEARANCE Clear Clear    SPECIFIC GRAVITY 1.022 1.002 - 1.030    PH 5.5 5.0 - 9.0    LEUKOCYTES Negative Negative, 100  WBCs/uL    NITRITE Negative Negative    PROTEIN 10 Negative, 10 , 20  mg/dL    GLUCOSE 50 (A) Negative, 30  mg/dL    KETONES Negative Negative, Trace mg/dL    BILIRUBIN Negative Negative, 0.5 mg/dL    BLOOD Negative Negative, 0.03 mg/dL    UROBILINOGEN Normal Normal mg/dL   URINALYSIS, MICROSCOPIC   Result Value Ref Range    MUCOUS Rare Rare, Occasional, Few /hpf    RBCS 1 <4 /hpf    WBCS 1 <6 /hpf    NON-SQUAMOUS EPITHELIAL CELLS URINE <1 <=1 /hpf   TROPONIN IN THREE HOURS   Result Value Ref Range    TROPONIN I 5 <20 ng/L   LACTIC ACID - SECOND REFLEX   Result Value Ref Range    LACTIC ACID 1.6 0.5 - 2.2 mmol/L          Physical:  Filed Vitals:    02/11/23 1115 02/11/23 1130 02/11/23 1145 02/11/23 1200   BP: (!) 140/78 132/72 130/70 123/64   Pulse: 80 75 77 76   Resp: 15 15 13 13    Temp:   36.6 C (97.9 F)    SpO2: 97% 95% 94% 97%      General: Patient is alert and oriented to person, place, and time. No acute distress. Communicates appropriately.   Head: Normocephalic and atraumatic.    Eyes: Pupils equally round and react to light and accommodate. Extraocular movements intact.  Conjunctiva normal. Sclerae are normal.    Nose: Nasal passages clear. Mucosa moist.    Throat: Moist oral mucosa. No erythema or exudate of the pharynx. Clear oropharynx.    Neck: Supple. No cervical lymphadenopathy or supraclavicular nodes detected. Trachea midline   Heart: Regular rate and rhythm. S1 & S2 present. No S3 or S4. No rubs, gallops, or murmurs appreciated.   Lungs: Clear to auscultation bilaterally with no wheezes or rales. Equal chest  excursion.  No conversational dyspnea. No respiratory distress noted.   Abdomen: Soft, nontender, nondistended belly. Bowel sounds are present in all four quadrants. No rigidity.  No guarding.  No ascites.   Extremities: No edema, cyanosis, or clubbing. Grossly moves all extremities.    Skin: Warm and dry without lesions. No ecchymosis noted.    Neurologic: Cranial nerves II through XII are grossly intact.Strength 5/5 in upper extremities and lower extremities bilaterally.    Genitourinary:  No urinary incontinence or Foley catheter   Psychiatric: Judgment and insight are intact. Mood and affect are appropriate for the situation.       Diagnostic studies:  No results found.       EKG interpretation:     @PEVF @    Assessment:  Active Hospital Problems   (*Primary Problem)    Diagnosis    *Syncope    Diabetes mellitus, type 2 (CMS HCC)     Chronic    Hypertension     Chronic       Plan:  Patient will be placed in observation  Syncope  Diabetes type 2  Hypertension    Consult cardiology  Normal saline at 100 mL/hour  Echocardiogram  Brain CTA  Brain MRI  Orthostatic vitals  Monitor glucose and sliding scale coverage    DVT px: SCDs    The hospitalist has examined patient, and reviewed all material, and agrees with the above medical management at this time.      DVT prophylaxis: SCDs    Valentina Shaggy, [facility]    [facility] MEDICINE HOSPITALIST

## 2023-02-11 NOTE — Care Plan (Signed)
Problem: Adult Inpatient Plan of Care  Goal: Plan of Care Review  Outcome: Ongoing (see interventions/notes)  Goal: Patient-Specific Goal (Individualized)  Reactivated  Flowsheets (Taken 02/11/2023 1642)  Individualized Care Needs: monitor  Anxieties, Fears or Concerns: none voiced at this time  Patient-Specific Goals (Include Timeframe): dc home when stable  Plan of Care Reviewed With: patient  Goal: Absence of Hospital-Acquired Illness or Injury  Reactivated  Goal: Optimal Comfort and Wellbeing  Reactivated  Goal: Rounds/Family Conference  Reactivated     Problem: Fall Injury Risk  Goal: Absence of Fall and Fall-Related Injury  Reactivated     Patient admitted to room 319A. Alert and orients, VSS, denied needs at this time. Oriented to unit. Family at bedside. Call bell in reach. Plan of care initiated.

## 2023-02-12 DIAGNOSIS — K56600 Partial intestinal obstruction, unspecified as to cause: Secondary | ICD-10-CM

## 2023-02-12 DIAGNOSIS — N400 Enlarged prostate without lower urinary tract symptoms: Secondary | ICD-10-CM

## 2023-02-12 DIAGNOSIS — I10 Essential (primary) hypertension: Secondary | ICD-10-CM

## 2023-02-12 DIAGNOSIS — M546 Pain in thoracic spine: Secondary | ICD-10-CM

## 2023-02-12 DIAGNOSIS — G8929 Other chronic pain: Secondary | ICD-10-CM

## 2023-02-12 DIAGNOSIS — Z7984 Long term (current) use of oral hypoglycemic drugs: Secondary | ICD-10-CM

## 2023-02-12 DIAGNOSIS — R55 Syncope and collapse: Secondary | ICD-10-CM

## 2023-02-12 DIAGNOSIS — G4700 Insomnia, unspecified: Secondary | ICD-10-CM

## 2023-02-12 DIAGNOSIS — E782 Mixed hyperlipidemia: Secondary | ICD-10-CM

## 2023-02-12 DIAGNOSIS — E119 Type 2 diabetes mellitus without complications: Secondary | ICD-10-CM

## 2023-02-12 DIAGNOSIS — F32A Depression, unspecified: Secondary | ICD-10-CM

## 2023-02-12 LAB — COMPREHENSIVE METABOLIC PANEL, NON-FASTING
ALBUMIN/GLOBULIN RATIO: 1.5 — ABNORMAL HIGH (ref 0.8–1.4)
ALBUMIN: 3.7 g/dL (ref 3.5–5.7)
ALKALINE PHOSPHATASE: 43 U/L (ref 34–104)
ALT (SGPT): 31 U/L (ref 7–52)
ANION GAP: 6 mmol/L (ref 4–13)
AST (SGOT): 22 U/L (ref 13–39)
BILIRUBIN TOTAL: 0.6 mg/dL (ref 0.3–1.0)
BUN/CREA RATIO: 16 (ref 6–22)
BUN: 18 mg/dL (ref 7–25)
CALCIUM, CORRECTED: 9.1 mg/dL (ref 8.9–10.8)
CALCIUM: 8.9 mg/dL (ref 8.6–10.3)
CHLORIDE: 104 mmol/L (ref 98–107)
CO2 TOTAL: 26 mmol/L (ref 21–31)
CREATININE: 1.12 mg/dL (ref 0.60–1.30)
ESTIMATED GFR: 70 mL/min/{1.73_m2} (ref >59)
GLOBULIN: 2.5 — ABNORMAL LOW (ref 2.9–5.4)
GLUCOSE: 104 mg/dL (ref 74–109)
OSMOLALITY, CALCULATED: 274 mosm/kg (ref 270–290)
POTASSIUM: 4.1 mmol/L (ref 3.5–5.1)
PROTEIN TOTAL: 6.2 g/dL — ABNORMAL LOW (ref 6.4–8.9)
SODIUM: 136 mmol/L (ref 136–145)

## 2023-02-12 LAB — CBC WITH DIFF
BASOPHIL #: 0 10*3/uL (ref 0.00–0.10)
BASOPHIL %: 1 % (ref 0–1)
EOSINOPHIL #: 0.2 10*3/uL (ref 0.00–0.50)
EOSINOPHIL %: 3 % (ref 1–8)
HCT: 40.6 % (ref 36.7–47.1)
HGB: 14.3 g/dL (ref 12.5–16.3)
LYMPHOCYTE #: 1.7 10*3/uL (ref 1.00–3.00)
LYMPHOCYTE %: 30 % (ref 16–44)
MCH: 32.7 pg (ref 23.8–33.4)
MCHC: 35.2 g/dL (ref 32.5–36.3)
MCV: 93 fL (ref 73.0–96.2)
MONOCYTE #: 0.7 10*3/uL (ref 0.30–1.00)
MONOCYTE %: 13 % (ref 5–13)
MPV: 10 fL (ref 7.4–11.4)
NEUTROPHIL #: 3 10*3/uL (ref 1.85–7.80)
NEUTROPHIL %: 53 % (ref 43–77)
PLATELETS: 137 10*3/uL — ABNORMAL LOW (ref 140–440)
RBC: 4.37 10*6/uL (ref 4.06–5.63)
RDW: 12.8 % (ref 12.1–16.2)
WBC: 5.5 10*3/uL (ref 3.6–10.2)

## 2023-02-12 LAB — POC BLOOD GLUCOSE (RESULTS)
GLUCOSE, POC: 153 mg/dL — ABNORMAL HIGH (ref 70–100)
GLUCOSE, POC: 205 mg/dL — ABNORMAL HIGH (ref 70–100)

## 2023-02-12 LAB — MAGNESIUM: MAGNESIUM: 1.7 mg/dL — ABNORMAL LOW (ref 1.9–2.7)

## 2023-02-12 NOTE — Care Plan (Signed)
Problem: Adult Inpatient Plan of Care  Goal: Plan of Care Review  Outcome: Ongoing (see interventions/notes)  Goal: Patient-Specific Goal (Individualized)  Outcome: Ongoing (see interventions/notes)  Flowsheets (Taken 02/12/2023 0900)  Individualized Care Needs: dc home today  Anxieties, Fears or Concerns: none voiced  Patient-Specific Goals (Include Timeframe): dc home today  Plan of Care Reviewed With: patient  Goal: Absence of Hospital-Acquired Illness or Injury  Outcome: Ongoing (see interventions/notes)  Intervention: Identify and Manage Fall Risk  Recent Flowsheet Documentation  Taken 02/12/2023 0900 by Haynes Dage, RN  Safety Promotion/Fall Prevention: activity supervised  Intervention: Prevent Skin Injury  Recent Flowsheet Documentation  Taken 02/12/2023 0900 by Haynes Dage, RN  Skin Protection: adhesive use limited  Intervention: Prevent and Manage VTE (Venous Thromboembolism) Risk  Recent Flowsheet Documentation  Taken 02/12/2023 0948 by Haynes Dage, RN  VTE Prevention/Management: ambulation promoted  Taken 02/12/2023 0800 by Haynes Dage, RN  VTE Prevention/Management: ambulation promoted  Goal: Optimal Comfort and Wellbeing  Outcome: Ongoing (see interventions/notes)  Goal: Rounds/Family Conference  Outcome: Ongoing (see interventions/notes)     Problem: Fall Injury Risk  Goal: Absence of Fall and Fall-Related Injury  Outcome: Ongoing (see interventions/notes)  Intervention: Promote Injury-Free Environment  Recent Flowsheet Documentation  Taken 02/12/2023 0900 by Haynes Dage, RN  Safety Promotion/Fall Prevention: activity supervised

## 2023-02-12 NOTE — Nurses Notes (Signed)
Patient discharged home with family.  AVS reviewed with patient/care giver.  A written copy of the AVS and discharge instructions was given to the patient/care giver.  Questions sufficiently answered as needed.  Patient/care giver encouraged to follow up with PCP as indicated.  In the event of an emergency, patient/care giver instructed to call 911 or go to the nearest emergency room.

## 2023-02-12 NOTE — Discharge Summary (Signed)
[facility]  DISCHARGE SUMMARY    PATIENT NAME:  Steven Henderson, Steven Henderson  MRN:  [id number]  DOB:  October 23, 1950    ENCOUNTER DATE:  02/11/2023  INPATIENT ADMISSION DATE:   DISCHARGE DATE:  02/12/2023    ATTENDING PHYSICIAN: No att. providers found  SERVICE: PRN HOSPITALIST 3  PRIMARY CARE PHYSICIAN: Lucia Gaskins, DO       No lay caregiver identified.    PRIMARY DISCHARGE DIAGNOSIS: Syncope  Active Hospital Problems    Diagnosis Date Noted    Principal Problem: Syncope [R55] 02/11/2023    Diabetes mellitus, type 2 (CMS HCC) [E11.9] 10/14/2021    Hypertension [I10] 10/14/2021      Resolved Hospital Problems   No resolved problems to display.     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    Dupuytren's contracture of both hands 05/26/2022    Hypertension associated with type 2 diabetes mellitus (CMS HCC)  (CMS HCC) 02/04/2022    Fatigue 02/04/2022    Anxiety state 10/14/2021    BPH (benign prostatic hyperplasia) 10/14/2021    Depression, acute 10/14/2021    Elevated PSA 10/14/2021    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        Details   aspirin 81 mg Tablet, Delayed Release (E.C.)  Commonly known as: ECOTRIN   Take 1 Tablet (81 mg total)  by mouth Once a day  Refills: 0     bethanechol chloride 10 mg Tablet  Commonly known as: URECHOLINE   Take 1 Tablet (10 mg total) by mouth Three times a day  Qty: 90 Tablet  Refills: 3     finasteride 5 mg Tablet  Commonly known as: PROSCAR   Take 1 Tablet (5 mg total) by mouth Once a day  Refills: 0     glimepiride 4 mg Tablet  Commonly known as: AMARYL   Take 1 Tablet (4 mg total) by mouth Twice daily  Qty: 180 Tablet  Refills: 3     meloxicam 15 mg Tablet  Commonly known as: MOBIC   TAKE (1) TABLET DAILY WITH FOOD.  Qty: 30 Tablet  Refills: 1     rosuvastatin 10 mg Tablet  Commonly known as: CRESTOR   Take 1 Tablet (10 mg total) by mouth Every evening  Qty: 90 Tablet  Refills: 3     tamsulosin 0.4 mg Capsule  Commonly known as: FLOMAX   Take 1 Capsule (0.4 mg total) by mouth Every evening after dinner  Refills: 0            STOP taking these medications.  albuterol sulfate 90 mcg/actuation oral inhaler  Commonly known as: PROVENTIL or VENTOLIN or PROAIR     amLODIPine 10 mg Tablet  Commonly known as: NORVASC     hydroCHLOROthiazide 25 mg Tablet  Commonly known as: HYDRODIURIL     MetFORMIN 1,000 mg Tablet  Commonly known as: GLUCOPHAGE     methocarbamoL 500 mg Tablet  Commonly known as: ROBAXIN     telmisartan 80 mg Tablet  Commonly known as: MICARDIS            Discharge med list refreshed?  YES     No Known Allergies  HOSPITAL PROCEDURE(S):   No orders of the defined types were placed in this encounter.      REASON FOR HOSPITALIZATION AND HOSPITAL COURSE   BRIEF HPI:  This is a 72 y.o., male admitted for syncope, orthostatic hypotension,  BRIEF HOSPITAL NARRATIVE: See H&P for admission details    Syncope likely secondary to hypotension:  Patient's blood pressure upon presentation was 120s over 70s.  Patient was on several blood pressure medications including Norvasc, HCTZ, telmisartan.  Blood pressure medication were placed on hold during hospitalization patient did not require resumption of blood pressure  medications.  He will not continue with blood pressure medications at home.  He was instructed to check blood pressure 3 times a day and keep record for PCP in case he needs blood pressure medication adjustment.  Patient was given IV fluids and did not have any further episodes of syncope throughout hospitalization.  Tele cardiology consulted-see consult note.    CONDITION ON DISCHARGE:  A. Ambulation: Full ambulation  B. Self-care Ability: Complete  C. Cognitive Status Alert and Oriented x 3  D. Code status at discharge:       LINES/DRAINS/WOUNDS AT DISCHARGE:   Patient Lines/Drains/Airways Status       Active Line / Dialysis Catheter / Dialysis Graft / Drain / Airway / Wound       None                    DISCHARGE DISPOSITION:  Home discharge  DISCHARGE INSTRUCTIONS:  Post-Discharge Follow Up Appointments       Follow up with Lucia Gaskins, DO in 1 week    Phone: [phone]    Where: [facility]    Follow up with Dyke Brackett, DO in 2 weeks    Phone: [phone]    Where: [facility]      Tuesday Apr 13, 2023    Return Patient Visit with Lucia Gaskins, DO at  9:45 AM      Friday Apr 23, 2023    Lab with Lab Chair 1, [facility] [place] at  8:30 AM    Return Patient Visit with Guss Bunde, FNP-C at  9:00 AM      Wednesday Sep 08, 2023    Return Patient Visit with Lucia Gaskins, DO at  8:30 AM      Hematology/Oncology, [facility], [place]  [address]  [place] [address]  [phone] Internal Medicine, [facility] Internal Medicine  [facility] Internal Medicine, [place]  [address]  [place] [address]  [phone] Laboratory Services, [facility], [place]  [address]  [place] [address]  [phone]          No discharge procedures on file.       Antony Contras, DO    Copies sent to  Care Team         Relationship Specialty Notifications Start End    Lucia Gaskins, DO PCP - General  INTERNAL MEDICINE Admissions 08/25/21     Phone: [phone] Fax: [phone]         [address] [place] [address]    Marvene Staff, RN Oncology Nurse Navigator  Abnormal results only, Admissions 10/23/21             Referring providers can utilize [email or web address] to access their referred [facility] Medicine patient's information.

## 2023-02-12 NOTE — Telemedicine Consult (Signed)
[facility]  Cardiology   Consult      TELEMEDICINE CARDIOLOGY CONSULT NOTE    Telemedicine Documentation:   Modality: Video  Provider Physical Location: [place], [facility]  Patient/family aware of provider location: Yes  Patient/family consent for telemedicine: Yes  Examination observed and performed by: Hassel Neth, PA-C, Dr. Despina Hidden  Patient Location: [facility]    Name:  [name]   DOB: [birth year]   MRN: [id number]   Date:  02/11/2023     PCP: Lucia Gaskins, DO   Hospital Day:  LOS: 0 days   Chief Complaint: Syncope      IMPRESSION/PLAN:  Syncope  Type II DM  HTN  BPH    Based on overall symptomatology sounds like vasovagal versus orthostatic syncope.  Echo normal.  No further inpatient cardiac workup necessary.  Patient can follow up with his regular PCP.    Echo:  Ordered by the hospitalist than already performed.  EF 65.6%.  There was no wall motion abnormalities.  Right ventricular size and function normal limits.  No significant valvular disease.    HPI:  Steven Henderson is a 72 y.o. male who presented to the [facility] ER after witnessed syncopal episode a local funeral home.  Patient works at [employer] and states that he became very lightheaded and passed out.  He did not fall due to a co-worker standing next to him lowered him to the floor.  Prior to the syncopal episode he denied any chest pain, palpitations, nausea vomiting or diarrhea.  When he did come to he did feel like he was having some difficulty breathing.  Patient does have known history of BPH, type 2 diabetes, hypertension, hemochromatosis, anxiety and depression.  He was on blood pressure medications but has not had any recent adjustments to these medications.  He does monitor his blood pressure at home and states it typically runs in the 130s over 80s.  Blood pressure at presentation was 123/64.  EKG with sinus rhythm.  CBC was unremarkable.  Blood sugar was elevated at 237.  Head CT  showed no acute findings.  Troponins were negative at 4 and 4.  There was no reported seizure activity or trauma.  He has had a headache in the right frontal area for the last several days and did take some Tylenol yesterday morning for this.      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    Chronic right shoulder pain 09/23/2022    Colon cancer screening 09/07/2022    Partial small bowel obstruction (CMS HCC) 07/24/2022    Dehydration 07/24/2022    Nausea and vomiting, unspecified vomiting type 07/24/2022    Ileus (CMS HCC) 07/23/2022    Nausea, vomiting and diarrhea  07/23/2022    Abdominal pain 07/23/2022    Chronic cough 07/23/2022    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    Dupuytren's contracture of both hands 05/26/2022    Hypertension associated with type 2 diabetes mellitus (CMS HCC)  (CMS HCC) 02/04/2022    Fatigue 02/04/2022    Anxiety state 10/14/2021    BPH (benign prostatic hyperplasia) 10/14/2021    Depression, acute 10/14/2021    Diabetes mellitus, type 2 (CMS HCC) 10/14/2021    Elevated PSA 10/14/2021    Generalized OA 10/14/2021    Hypertension 10/14/2021    Hypomagnesemia 10/14/2021    Insomnia 10/14/2021    Major depression, 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/2021    Chronic bilateral thoracic back pain 10/14/2021      Past Surgical History:   Procedure Laterality Date    CYSTOSCOPY  06/23/2017    FINGER SURGERY Left     RING FINGER    HAND SURGERY Right     HX APPENDECTOMY            acetaminophen (TYLENOL)  tablet, 650 mg, Oral, Q4H PRN  aspirin chewable tablet 81 mg, 81 mg, Oral, Daily  atorvastatin (LIPITOR) tablet, 20 mg, Oral, QPM  bethanechol (URECHOLINE) tablet, 6.25 mg, Oral, 3x/day  Correction/SSIP insulin lispro (HumaLOG) 100 units/mL injection, 2-9 Units, Subcutaneous, 4x/day AC  dextrose (GLUTOSE) 40% oral gel, 15 g, Oral, Q15 Min PRN  dextrose 50% (0.5 g/mL) injection - syringe, 12.5 g, Intravenous, Q15 Min PRN  enoxaparin PF (LOVENOX) 40 mg/0.4 mL SubQ injection, 40 mg, Subcutaneous, Q24H  finasteride (PROSCAR) tablet, 5 mg, Oral, Daily  glucagon (GLUCAGEN DIAGNOSTIC KIT) injection 1 mg, 1 mg, IntraMUSCULAR, Once PRN  ipratropium-albuterol 0.5 mg-3 mg(2.5 mg base)/3 mL Solution for Nebulization, 3 mL, Nebulization, Q4H PRN  NS premix infusion, , Intravenous, Continuous  ondansetron (ZOFRAN) 2 mg/mL injection, 4 mg, Intravenous, Q6H PRN  perflutren lipid microspheres (DEFINITY) 1.3 mL in NS 10 mL (tot vol) injection, 2 mL, Intravenous, Cardiology Once PRN  tamsulosin (FLOMAX) capsule, 0.4 mg, Oral, Daily after Dinner         No Known Allergies  Family Medical History:       Problem Relation (Age of Onset)    Liver Cancer Father    Stroke Mother            Social History     Tobacco Use    Smoking status: Never     Passive exposure: Never    Smokeless tobacco: Never   Substance Use Topics    Alcohol use: Not Currently       ROS: All other ROS are negative except for what is noted in HPI     I/O last 24 hours:    Intake/Output Summary (Last 24 hours) at 02/12/2023 0826  Last data filed at 02/11/2023 1800  Gross per 24 hour   Intake 240 ml   Output --   Net 240 ml     I/O current shift:  No intake/output data recorded.    DIAGNOSTIC STUDIES:    Results for orders placed or performed during the hospital encounter of 02/11/23 (from the past 24 hour(s))   CBC/DIFF    Narrative    The following orders were created for panel order CBC/DIFF.  Procedure                               Abnormality  Status                      ---------                               -----------         ------                     CBC WITH ZOXW[[id number]]                Normal              Final result                 Please view results for these tests on the individual orders.   COMPREHENSIVE METABOLIC PANEL, NON-FASTING   Result Value Ref Range    SODIUM 135 (L) 136 - 145 mmol/L    POTASSIUM 3.7 3.5 - 5.1 mmol/L    CHLORIDE 103 98 - 107 mmol/L    CO2 TOTAL 21 21 - 31 mmol/L    ANION GAP 11 4 - 13 mmol/L    BUN 21 7 - 25 mg/dL    CREATININE 1.19 1.47 - 1.30 mg/dL    BUN/CREA RATIO 17 6 - 22    ESTIMATED GFR 63 >59 mL/min/1.60m^2    ALBUMIN 4.0 3.5 - 5.7 g/dL    CALCIUM 9.6 8.6 - 82.9 mg/dL    GLUCOSE 562 (H) 74 - 109 mg/dL    ALKALINE PHOSPHATASE 47 34 - 104 U/L    ALT (SGPT) 36 7 - 52 U/L    AST (SGOT) 28 13 - 39 U/L    BILIRUBIN TOTAL 0.8 0.3 - 1.0 mg/dL    PROTEIN TOTAL 7.0 6.4 - 8.9 g/dL    ALBUMIN/GLOBULIN RATIO 1.3 0.8 - 1.4    OSMOLALITY, CALCULATED 281 270 - 290 mOsm/kg    CALCIUM, CORRECTED 9.6 8.9 - 10.8 mg/dL    GLOBULIN 3.0 2.9 - 5.4    Narrative    Estimated Glomerular Filtration Rate (eGFR) is calculated using the CKD-EPI (2021) equation, intended for patients 40 years of age and older. If gender is not documented or "unknown", there will be no eGFR calculation.     ETHANOL, SERUM/PLASMA   Result Value Ref Range    ETHANOL <10 0 mg/dL   URINALYSIS, MACROSCOPIC AND MICROSCOPIC W/CULTURE REFLEX    Specimen: Urine, Clean Catch    Narrative    The following orders were created for panel order URINALYSIS, MACROSCOPIC AND MICROSCOPIC W/CULTURE REFLEX.  Procedure                               Abnormality         Status                     ---------                               -----------         ------                     URINALYSIS, MACROSCOPIC[[id number]]      Abnormal            Final result  URINALYSIS, MICROSCOPIC[[id number]]      Normal              Final result                 Please view results for these tests on the  individual orders.   TROPONIN NOW   Result Value Ref Range    TROPONIN I 4 <20 ng/L   TROPONIN IN ONE HOUR   Result Value Ref Range    TROPONIN I 4 <20 ng/L   TROPONIN IN THREE HOURS   Result Value Ref Range    TROPONIN I 5 <20 ng/L   THYROID STIMULATING HORMONE WITH FREE T4 REFLEX   Result Value Ref Range    TSH 3.072 0.450 - 5.330 uIU/mL   D-DIMER   Result Value Ref Range    D-DIMER 451 215 - 500 ng/mL FEU    Narrative    D-Dimers are reported in FEU per ng/mL.    IF PATIENT IS EXHIBITING SYMPTOMS DVT/PE, THIS D-DIMER RESULT MAY INDICATE A NEED FOR FURTHER TESTING FOR THESE CONDITIONS. IF PATIENT IS SUSPECTED OF DIC AND SYMPTOMS WORSEN OR PERSIST, A REPEAT DIC WORKUP SHOULD BE CONSIDERED.    NOTE: ALTHOUGH THE NORMAL RANGE FOR THIS TEST IS 215-500 ng/mL FEU, LITERATURE RECOMMENDS FURTHER TESTING FOR ANY RESULT >500ng/mL FEU.    A cut off value of 500ng/mL FEU or below can be used as an aid in the diagnosis of Thromboembolism when used in conjunction with the patient's medical history, clinical presentation and other findings. Results of 500ng/mL FEU or below have a negative predictive value of 100%.     CBC WITH DIFF   Result Value Ref Range    WBC 7.0 3.6 - 10.2 x10^3/uL    RBC 4.58 4.06 - 5.63 x10^6/uL    HGB 14.9 12.5 - 16.3 g/dL    HCT 41.6 60.6 - 30.1 %    MCV 93.6 73.0 - 96.2 fL    MCH 32.5 23.8 - 33.4 pg    MCHC 34.7 32.5 - 36.3 g/dL    RDW 60.1 09.3 - 23.5 %    PLATELETS 148 140 - 440 x10^3/uL    MPV 10.1 7.4 - 11.4 fL    NEUTROPHIL % 73 43 - 77 %    LYMPHOCYTE % 18 16 - 44 %    MONOCYTE % 8 5 - 13 %    EOSINOPHIL % 1 1 - 8 %    BASOPHIL % 0 0 - 1 %    NEUTROPHIL # 5.10 1.85 - 7.80 x10^3/uL    LYMPHOCYTE # 1.20 1.00 - 3.00 x10^3/uL    MONOCYTE # 0.60 0.30 - 1.00 x10^3/uL    EOSINOPHIL # 0.10 0.00 - 0.50 x10^3/uL    BASOPHIL # 0.00 0.00 - 0.10 x10^3/uL   URINALYSIS, MACROSCOPIC   Result Value Ref Range    COLOR Light Yellow Colorless, Light Yellow, Yellow    APPEARANCE Clear Clear    SPECIFIC GRAVITY 1.022  1.002 - 1.030    PH 5.5 5.0 - 9.0    LEUKOCYTES Negative Negative, 100  WBCs/uL    NITRITE Negative Negative    PROTEIN 10 Negative, 10 , 20  mg/dL    GLUCOSE 50 (A) Negative, 30  mg/dL    KETONES Negative Negative, Trace mg/dL    BILIRUBIN Negative Negative, 0.5 mg/dL    BLOOD Negative Negative, 0.03 mg/dL    UROBILINOGEN Normal Normal mg/dL   URINALYSIS, MICROSCOPIC   Result  Value Ref Range    MUCOUS Rare Rare, Occasional, Few /hpf    RBCS 1 <4 /hpf    WBCS 1 <6 /hpf    NON-SQUAMOUS EPITHELIAL CELLS URINE <1 <=1 /hpf   ARTERIAL BLOOD GAS/LACTATE   Result Value Ref Range    PH (ARTERIAL) 7.40 7.35 - 7.45    PCO2 (ARTERIAL) 32 (L) 35 - 45 mm/Hg    BICARBONATE (ARTERIAL) 21.3 20.0 - 26.0 mmol/L    MET-HEMOGLOBIN 0.6 <=2.0 %    LACTATE 2.9 (H) <=2.0 mmol/L    CARBOXYHEMOGLOBIN 1.5 <=1.5 %    O2CT 19.8 %    %FIO2 (ARTERIAL) 21 %    PO2 (ARTERIAL) 78 (L) 80 - 100 mm/Hg    OXYHEMOGLOBIN 94.9 88.0 - 100.0 %    ALLEN TEST yes     DRAW SITE left radial     BASE DEFICIT 4.7 (H) 0.0 - 2.0 mmol/L   EXTRA TUBES    Narrative    The following orders were created for panel order EXTRA TUBES.  Procedure                               Abnormality         Status                     ---------                               -----------         ------                     Josepha Pigg                                    Final result                 Please view results for these tests on the individual orders.   GRAY TOP TUBE   Result Value Ref Range    RAINBOW/EXTRA TUBE AUTO RESULT Yes    LACTIC ACID - FIRST REFLEX   Result Value Ref Range    LACTIC ACID 2.1 0.5 - 2.2 mmol/L   LACTIC ACID - SECOND REFLEX   Result Value Ref Range    LACTIC ACID 1.6 0.5 - 2.2 mmol/L   COVID-19, FLU A/B, RSV RAPID BY PCR   Result Value Ref Range    SARS-CoV-2 Not Detected Not Detected    INFLUENZA VIRUS TYPE A Not Detected Not Detected    INFLUENZA VIRUS TYPE B Not Detected Not Detected    RESPIRATORY SYNCTIAL VIRUS (RSV) Not Detected Not Detected     Narrative    Results are for the simultaneous qualitative identification of SARS-CoV-2 (formerly 2019-nCoV), Influenza A, Influenza B, and RSV RNA. These etiologic agents are generally detectable in nasopharyngeal and nasal swabs during the ACUTE PHASE of infection. Hence, this test is intended to be performed on respiratory specimens collected from individuals with signs and symptoms of upper respiratory tract infection who meet Centers for Disease Control and Prevention (CDC) clinical and/or epidemiological criteria for Coronavirus Disease 2019 (COVID-19) testing. CDC COVID-19 criteria for testing on human specimens is available at [facility] webpage information for Healthcare Professionals: Coronavirus Disease 2019 (COVID-19) ([employer]).  False-negative results may occur if the virus has genomic mutations, insertions, deletions, or rearrangements or if performed very early in the course of illness. Otherwise, negative results indicate virus specific RNA targets are not detected, however negative results do not preclude SARS-CoV-2 infection/COVID-19, Influenza, or Respiratory syncytial virus infection. Results should not be used as the sole basis for patient management decisions. Negative results must be combined with clinical observations, patient history, and epidemiological information. If upper respiratory tract infection is still suspected based on exposure history together with other clinical findings, re-testing should be considered.    Test methodology:   Cepheid Xpert Xpress SARS-CoV-2/Flu/RSV Assay real-time polymerase chain reaction (RT-PCR) test on the GeneXpert Dx and Xpert Xpress systems.   CREATINE KINASE (CK), TOTAL, SERUM OR PLASMA   Result Value Ref Range    CREATINE KINASE 264 (H) 30 - 223 U/L   CBC/DIFF    Narrative    The following orders were created for panel order CBC/DIFF.  Procedure                               Abnormality         Status                      ---------                               -----------         ------                     CBC WITH ZOXW[[id number]]                Abnormal            Final result                 Please view results for these tests on the individual orders.   COMPREHENSIVE METABOLIC PANEL, NON-FASTING   Result Value Ref Range    SODIUM 136 136 - 145 mmol/L    POTASSIUM 4.1 3.5 - 5.1 mmol/L    CHLORIDE 104 98 - 107 mmol/L    CO2 TOTAL 26 21 - 31 mmol/L    ANION GAP 6 4 - 13 mmol/L    BUN 18 7 - 25 mg/dL    CREATININE 1.19 1.47 - 1.30 mg/dL    BUN/CREA RATIO 16 6 - 22    ESTIMATED GFR 70 >59 mL/min/1.70m^2    ALBUMIN 3.7 3.5 - 5.7 g/dL    CALCIUM 8.9 8.6 - 82.9 mg/dL    GLUCOSE 562 74 - 130 mg/dL    ALKALINE PHOSPHATASE 43 34 - 104 U/L    ALT (SGPT) 31 7 - 52 U/L    AST (SGOT) 22 13 - 39 U/L    BILIRUBIN TOTAL 0.6 0.3 - 1.0 mg/dL    PROTEIN TOTAL 6.2 (L) 6.4 - 8.9 g/dL    ALBUMIN/GLOBULIN RATIO 1.5 (H) 0.8 - 1.4    OSMOLALITY, CALCULATED 274 270 - 290 mOsm/kg    CALCIUM, CORRECTED 9.1 8.9 - 10.8 mg/dL    GLOBULIN 2.5 (L) 2.9 - 5.4    Narrative    Estimated Glomerular Filtration Rate (eGFR) is calculated using the CKD-EPI (2021) equation, intended for patients 60 years of age and older. If gender is not documented or "unknown", there will be no eGFR  calculation.     MAGNESIUM   Result Value Ref Range    MAGNESIUM 1.7 (L) 1.9 - 2.7 mg/dL   CBC WITH DIFF   Result Value Ref Range    WBC 5.5 3.6 - 10.2 x10^3/uL    RBC 4.37 4.06 - 5.63 x10^6/uL    HGB 14.3 12.5 - 16.3 g/dL    HCT 08.6 57.8 - 46.9 %    MCV 93.0 73.0 - 96.2 fL    MCH 32.7 23.8 - 33.4 pg    MCHC 35.2 32.5 - 36.3 g/dL    RDW 62.9 52.8 - 41.3 %    PLATELETS 137 (L) 140 - 440 x10^3/uL    MPV 10.0 7.4 - 11.4 fL    NEUTROPHIL % 53 43 - 77 %    LYMPHOCYTE % 30 16 - 44 %    MONOCYTE % 13 5 - 13 %    EOSINOPHIL % 3 1 - 8 %    BASOPHIL % 1 0 - 1 %    NEUTROPHIL # 3.00 1.85 - 7.80 x10^3/uL    LYMPHOCYTE # 1.70 1.00 - 3.00 x10^3/uL    MONOCYTE # 0.70 0.30 - 1.00 x10^3/uL     EOSINOPHIL # 0.20 0.00 - 0.50 x10^3/uL    BASOPHIL # 0.00 0.00 - 0.10 x10^3/uL   POC BLOOD GLUCOSE (RESULTS)   Result Value Ref Range    GLUCOSE, POC 97 70 - 100 mg/dl   POC BLOOD GLUCOSE (RESULTS)   Result Value Ref Range    GLUCOSE, POC 154 (H) 70 - 100 mg/dl   POC BLOOD GLUCOSE (RESULTS)   Result Value Ref Range    GLUCOSE, POC 153 (H) 70 - 100 mg/dl   POC BLOOD GLUCOSE (RESULTS)   Result Value Ref Range    GLUCOSE, POC 153 (H) 70 - 100 mg/dl        Cardiopulmonary/Radiology:  MRI BRAIN WO CONTRAST    Result Date: 02/11/2023  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 NO RECENT INFARCT IDENTIFIED. Radiologist location ID: [id number]     CT ANGIO INTRA-EXTRA CRANIAL W IV CONTRAST    Result Date: 02/11/2023  Impression 1. RIGHT CAROTID: NO SIGNIFICANT STENOSIS 2. LEFT CAROTID: LESS THAN 50% STENOSIS 3. VERTEBRALS: PATENT 4. INTRACRANIAL: NO LARGE VESSEL OCCLUSION, HIGH-GRADE STENOSIS, OR ANEURYSM. One or more dose reduction techniques were used (e.g., Automated exposure control, adjustment of the mA and/or kV according to patient size, use of iterative reconstruction technique). Radiologist location ID: [id number]     CT BRAIN WO IV CONTRAST    Result Date: 02/11/2023  Impression NO ACUTE FINDINGS One or more dose reduction techniques were used (e.g., Automated exposure control, adjustment of the mA and/or kV according to patient size, use of iterative reconstruction technique). Radiologist location ID: [id number]     XR CHEST AP AND LATERAL    Result Date: 02/11/2023  Impression NO ACUTE FINDINGS. Radiologist location ID: [id number]      No results found for this or any previous visit (from the past 2400 hour(s)).     PHYSICAL EXAMINATION:  BP 117/67   Pulse 69   Temp 36.6 C (97.8 F)   Resp 18   Ht 1.727 m (5\' 8" )   Wt 92.4 kg (203 lb 12.8 oz)   SpO2 94%   BMI 30.99 kg/m     General: No acute distress and appears stated age.    HEENT: Head normocephalic, atraumatic.   Mucouse membranes moist.    Neck:  No JVD, no carotid bruit.    Lungs: Clear to auscultation bilaterally.    Cardiovascular: Regular rate and rhythm, normal S1-S2 without murmur, no gallop or rub.    Abdomen: Soft, bowel sounds normal.    Extremities: No edema.  Skin: Skin warm and dry.    Neurologic: Alert and oriented x3.  Psych: Mood and affect congruent for age and gender     I participated and evaluated the patient as part of a collaborative telemedicine service.  See Dr. Despina Hidden 's addendum for additional information.  My findings from this visit are as stated above.  The co-signing physician did not participate in the management of this patient unless otherwise noted.      Hassel Neth, PA-C 02/12/2023 08:26        I personally saw and evaluated the patient. See mid-level's note for additional details. My findings/participation are vasovagal syncope, echo normal.     John Giovanni, DO

## 2023-02-13 LAB — ECG 12 LEAD
Atrial Rate: 81 {beats}/min
Calculated P Axis: 42 degrees
Calculated R Axis: 85 degrees
Calculated T Axis: 10 degrees
PR Interval: 162 ms
QRS Duration: 98 ms
QT Interval: 378 ms
QTC Calculation: 439 ms
Ventricular rate: 81 {beats}/min

## 2023-02-15 ENCOUNTER — Telehealth (HOSPITAL_COMMUNITY): Payer: Self-pay

## 2023-02-15 NOTE — Telephone Encounter (Signed)
Transition of Care Contact Information  Discharge Date: 02/12/2023  Transition Facility Type--Hospital (Inpatient or Observation)  Facility Name--[facility]  Interactive Contact(s): Completed or attempted contact indicated by Date/Time  Completed Contact: 02/15/2023 10:33 AM  Contact Method(s)-- Patient/Caregiver Telephone  Clinical Staff Name/Role who contacted--Charmelle Soh RN  Transition Assessment  Discharge Summary obtained?--Yes  How are you recovering?--Not Any Better  Discharge Meds obtained?--Yes  Discharge medication changes reviewed?--Yes  Full Medication Reconciliation Completed?--No  Medication understanding --no new discharge meds prescribed  Medication Concerns?--Yes  Have everything needed for recovery?--Yes  Care Coordination:   Patient has transition follow-up appointment date and time?--No  Follow up appointment date:  Specialist Transition Visit planned?--Yes  Specialist Transition Visit date:  Patient/caregiver plans to attend transition visit?--Yes  Primary Follow-up Barrier--follow-up appointment needed  Interventions provided --reinforced discharge instructions--med list reviewed with patient--patient expresses understanding of follow-up plan--referral needs discussed  Home Health or DME ordered at discharge?--No  Clinician/Team notified?--No  Primary reason clinician notified?  Transition Note:Plan:      Patient requests transportation referral for follow up appointment? No  Instructions:     Reviewed After Visit Summery (AVS) and discharge instructions as outlined on AVS. Yes  Agrees to contact PCP for non-acute symptoms during the hours of 8 a.m. - 4 p.m., PCP number provided/reinforced. Yes  Nurse Navigator toll free number, resources available, and 24/7 hours of operation provided to patient and/or caregiver. Yes    Referrals to population health? No    Additional information/assessment:  Reports he did have one episode of dizziness since discharge Friday. This occurred  this morning. He sat down and it resolved in approximately 15 to 20 minutes. His BP was 143/73. No "feeling fine". Reviewed discontinued medications. Asking if he can restart metformin. His BS is 220s. He does take Amaryl. Advised he would need to contact his PCP who manages his diabetic meds. Agreeable. Per documentation both PCP and neurology will call for scheduling.     Jobie Quaker RN  [employer]

## 2023-02-16 ENCOUNTER — Telehealth (INDEPENDENT_AMBULATORY_CARE_PROVIDER_SITE_OTHER): Payer: Self-pay | Admitting: Internal Medicine

## 2023-02-16 NOTE — Telephone Encounter (Signed)
[facility] CALLED PT  GOT DISCHARGED ON 02-12-23 COMING 10-4 RE

## 2023-02-16 NOTE — Telephone Encounter (Signed)
Spoke with patient transitional care note completed ks,lpn

## 2023-02-18 ENCOUNTER — Ambulatory Visit (INDEPENDENT_AMBULATORY_CARE_PROVIDER_SITE_OTHER): Payer: Medicare Other | Admitting: NEUROLOGY

## 2023-02-18 ENCOUNTER — Encounter (INDEPENDENT_AMBULATORY_CARE_PROVIDER_SITE_OTHER): Payer: Self-pay | Admitting: NEUROLOGY

## 2023-02-18 ENCOUNTER — Other Ambulatory Visit: Payer: Self-pay

## 2023-02-18 VITALS — BP 149/74 | HR 94 | Temp 98.0°F | Ht 68.0 in | Wt 210.2 lb

## 2023-02-18 DIAGNOSIS — R55 Syncope and collapse: Secondary | ICD-10-CM

## 2023-02-18 DIAGNOSIS — R200 Anesthesia of skin: Secondary | ICD-10-CM

## 2023-02-19 ENCOUNTER — Encounter (INDEPENDENT_AMBULATORY_CARE_PROVIDER_SITE_OTHER): Payer: Self-pay | Admitting: Internal Medicine

## 2023-02-19 ENCOUNTER — Ambulatory Visit (INDEPENDENT_AMBULATORY_CARE_PROVIDER_SITE_OTHER): Payer: Medicare Other | Admitting: Internal Medicine

## 2023-02-19 VITALS — BP 162/82 | HR 78 | Resp 18 | Ht 68.0 in | Wt 208.0 lb

## 2023-02-19 DIAGNOSIS — E1142 Type 2 diabetes mellitus with diabetic polyneuropathy: Secondary | ICD-10-CM

## 2023-02-19 DIAGNOSIS — R338 Other retention of urine: Secondary | ICD-10-CM

## 2023-02-19 DIAGNOSIS — I10 Essential (primary) hypertension: Secondary | ICD-10-CM

## 2023-02-19 DIAGNOSIS — E782 Mixed hyperlipidemia: Secondary | ICD-10-CM

## 2023-02-19 DIAGNOSIS — I152 Hypertension secondary to endocrine disorders: Secondary | ICD-10-CM

## 2023-02-19 DIAGNOSIS — Z09 Encounter for follow-up examination after completed treatment for conditions other than malignant neoplasm: Secondary | ICD-10-CM

## 2023-02-19 DIAGNOSIS — E1159 Type 2 diabetes mellitus with other circulatory complications: Secondary | ICD-10-CM

## 2023-02-19 DIAGNOSIS — E1169 Type 2 diabetes mellitus with other specified complication: Secondary | ICD-10-CM

## 2023-02-19 DIAGNOSIS — F5101 Primary insomnia: Secondary | ICD-10-CM

## 2023-02-19 DIAGNOSIS — N401 Enlarged prostate with lower urinary tract symptoms: Secondary | ICD-10-CM

## 2023-02-19 MED ORDER — AMLODIPINE 10 MG TABLET
10.0000 mg | ORAL_TABLET | Freq: Every day | ORAL | Status: DC
Start: 2023-02-19 — End: 2024-01-10

## 2023-02-19 MED ORDER — TELMISARTAN 80 MG TABLET
80.0000 mg | ORAL_TABLET | Freq: Every day | ORAL | Status: DC
Start: 2023-02-19 — End: 2023-10-28

## 2023-02-19 MED ORDER — HYDROCHLOROTHIAZIDE 25 MG TABLET
25.0000 mg | ORAL_TABLET | Freq: Every day | ORAL | Status: DC
Start: 2023-02-19 — End: 2023-09-14

## 2023-02-19 NOTE — Progress Notes (Signed)
INTERNAL MEDICINE, [facility]  [address]  [place] [address]    Transitional Care Management Note    Name: Steven Henderson MRN:  [id number]   Date: 02/19/2023 Age: 72 y.o.     Chief Complaint: Hospital Discharge Transition and Hospital Discharge Transition       SUBJECTIVE:  Steven Henderson is a 72 y.o. male presenting today for follow-up after being discharged. The main problem requiring admission was syncope and diagnosed with hypotension..       OBJECTIVE:   BP (!) 162/82 (Site: Left Arm, Patient Position: Sitting, Cuff Size: Adult Large) Comment: Manual  Pulse 78   Resp 18   Ht 1.727 m (5\' 8" )   Wt 94.3 kg (208 lb)   SpO2 97%   BMI 31.63 kg/m          Transition of Care Contact Information  Discharge date: Discharge Date: 02/12/2023  Transition Facility Type--Hospital (Inpatient or Observation)  Facility Name--[facility] Interactive Contact(s):  Completed Contact: 02/16/2023 11:02 AM  Contact Method(s)-- Patient/Caregiver Telephone  Clinical Staff Name/Role who contacted--Kim Sexton LPN     Data Reviewed  Medication Reconciliation completed    Assessment and Plan    ICD-10-CM    1. Hospital discharge follow-up  Z09 Exam Performed     Medical Decision making MODERATE      2. Hypertension associated with type 2 diabetes mellitus (CMS HCC)  (CMS HCC)  E11.59 The patient's blood pressure today is elevated and they state that at home on a calibrated blood pressure cuff has been stable with systolic pressures less than 130 and diastolic blood pressures less than 90.  I have informed them that they should continue to monitor their blood pressures at home and also to have their blood pressure machine brought to the office for calibration they understand and will comply.     hydroCHLOROthiazide (HYDRODIURIL) 25 mg Oral Tablet    I15.2 amLODIPine (NORVASC) 10 mg Oral Tablet     telmisartan (MICARDIS) 80 mg Oral Tablet      3. Benign prostatic hyperplasia with urinary  retention  N40.1 The patient's condition is unchanged from prior.  We have discussed whether there is need for any therapeutic changes and the patient states that they are tolerating current treatments and will just continue with current lifestyle.     R33.8       4. DM type 2 with diabetic mixed hyperlipidemia (CMS HCC)  (CMS HCC)  E11.69 The patient's condition is unchanged from prior.  We have discussed whether there is need for any therapeutic changes and the patient states that they are tolerating current treatments and will just continue with current lifestyle.     E78.2       5. Primary hypertension  I10 The patient's blood pressure today is elevated and they state that at home on a calibrated blood pressure cuff has been stable with systolic pressures less than 130 and diastolic blood pressures less than 90.  I have informed them that they should continue to monitor their blood pressures at home and also to have their blood pressure machine brought to the office for calibration they understand and will comply.       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        7. Type 2 diabetes mellitus with diabetic polyneuropathy, without long-term current use of insulin (CMS HCC)  E11.42 Labs ordered here and on this visit will be  be addressed by me. I will contact patient and address therapy options once completed.       8. 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    Lucia Gaskins, DO

## 2023-02-23 NOTE — Assessment & Plan Note (Signed)
He is a 72 year old man who is referred for recent syncopal event. By description, I have no concern for primary neurologic process. It does not sound as though he actually lost consciousness. He was found to have hypotension in the hospital which is the most likely etiology.     -no further neurologic workup

## 2023-03-10 ENCOUNTER — Other Ambulatory Visit (INDEPENDENT_AMBULATORY_CARE_PROVIDER_SITE_OTHER): Payer: Self-pay | Admitting: Internal Medicine

## 2023-04-13 ENCOUNTER — Encounter (INDEPENDENT_AMBULATORY_CARE_PROVIDER_SITE_OTHER): Payer: Self-pay | Admitting: Internal Medicine

## 2023-04-16 ENCOUNTER — Other Ambulatory Visit (INDEPENDENT_AMBULATORY_CARE_PROVIDER_SITE_OTHER): Payer: Self-pay | Admitting: Internal Medicine

## 2023-04-22 NOTE — Cancer Center Note (Signed)
[facility]       [facility]       CANCER CENTER NOTE     Date: 04/23/2023  Name: Steven Henderson  MRN: [id number]  Referring Physician: Rogers Seeds, MD  Primary Care Provider: Lucia Gaskins, DO    REASON FOR VISIT:   Chief Complaint   Patient presents with    Follow Up     Hemochromatosis, unspecified hemochromatosis type        HISTORY OF PRESENT ILLNESS:   72 y.o.male from [facility] [address] for evaluation and management of hemochromatosis.  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   He has been undergoing phlebotomy treatments which he has been tolerating very well.      His last phlebotomy was in February 2022 which he tolerated really well.  He did not need a phlebotomy after that because of low ferritin.      REVIEW OF SYSTEMS:  Review of Systems   Constitutional:  Negative for appetite change, chills, fatigue and fever.   HENT:  Negative.     Eyes:  Positive for eye problems (worsening Right eye only under care of Opthamologist).   Respiratory: Negative.  Negative for cough and shortness of breath.    Cardiovascular: Negative.  Negative for chest pain, leg swelling and palpitations.   Gastrointestinal:  Positive for abdominal pain (intermittently to the lower abdoemen that is described as gnawing pain worse int he am, with some nausea) and nausea. Negative for blood in stool, constipation, diarrhea and vomiting.   Genitourinary:  Positive for difficulty urinating (unchanged). Negative for hematuria.    Musculoskeletal:  Positive for arthralgias (generalized) and back pain.   Skin: Negative.    Neurological:  Positive for dizziness, light-headedness and numbness (to fingers Carpal tunnel).   Hematological:  Negative for adenopathy. Bruises/bleeds easily.   Psychiatric/Behavioral:  Negative for depression and sleep disturbance. The patient is not nervous/anxious.          PAST MEDICAL HISTORY:  Past Medical History:    Diagnosis Date    Anxiety state     BPH (benign prostatic hyperplasia)     Depression, acute     Diabetes mellitus, type 2 (CMS HCC)     Elevated PSA     Generalized OA     Hemochromatosis     Hyperlipidemia     Hypertension     Hypomagnesemia     Insomnia     Syncope     Testicular hypofunction     Vitamin D deficiency      MEDICATIONS:  Current Outpatient Medications   Medication Sig    amLODIPine (NORVASC) 10 mg Oral Tablet Take 1 Tablet (10 mg total) by mouth Once a day    aspirin (ECOTRIN) 81 mg Oral Tablet, Delayed Release (E.C.) Take 1 Tablet (81 mg total) by mouth Once a day    finasteride (PROSCAR) 5 mg Oral Tablet Take 1 Tablet (5 mg total) by mouth Once a day    glimepiride (AMARYL) 4 mg Oral Tablet Take 1 Tablet (4 mg total) by mouth Twice daily    hydroCHLOROthiazide (HYDRODIURIL) 25 mg Oral Tablet Take 1 Tablet (25 mg total) by mouth Once a day    meloxicam (MOBIC) 15 mg Oral Tablet TAKE (1) TABLET DAILY WITH FOOD.    MetFORMIN (GLUCOPHAGE) 1,000 mg Oral Tablet Take 1 Tablet (1,000 mg total) by mouth Twice daily with food    rosuvastatin (CRESTOR) 10 mg Oral  Tablet Take 1 Tablet (10 mg total) by mouth Every evening    tamsulosin (FLOMAX) 0.4 mg Oral Capsule Take 1 Capsule (0.4 mg total) by mouth Every evening after dinner    telmisartan (MICARDIS) 80 mg Oral Tablet Take 1 Tablet (80 mg total) by mouth Once a day     ALLERGIES:  No Known Allergies  PAST SURGICAL HISTORY:  Past Surgical History:   Procedure Laterality Date    CYSTOSCOPY  06/23/2017    FINGER SURGERY Left     RING FINGER    HAND SURGERY Right     HX APPENDECTOMY       SOCIAL HISTORY:  Social History     Tobacco Use   Smoking Status Never    Passive exposure: Never   Smokeless Tobacco Never      E-Cigarette Vaping History:  E-Cigarette/Vaping Use: Never User    Substances vaped:  Nicotine: No  CBD: No    Additional information:  Disposable: No  Refillable Tank: No     FAMILY HISTORY:  Family Medical History:       Problem Relation (Age  of Onset)    Liver Cancer Father    Stroke Mother          PHYSICAL EXAMINATION:  Vitals:  Vitals:    04/23/23 0846   BP: (!) 163/80   Pulse: 82   Temp: 36.6 C (97.9 F)   TempSrc: Temporal   SpO2: 99%   Weight: 95.5 kg (210 lb 8 oz)   Height: 1.727 m (5\' 8" )   BMI: 32.01       Body mass index is 32.01 kg/m.    ECOG PS 0  Physical Exam  Constitutional:       General: He is not in acute distress.     Appearance: Normal appearance.   HENT:      Head: Normocephalic and atraumatic.      Nose: Nose normal.      Mouth/Throat:      Mouth: Mucous membranes are moist.      Pharynx: Oropharynx is clear.   Eyes:      General: No scleral icterus.        Right eye: No discharge.         Left eye: No discharge.      Pupils: Pupils are equal, round, and reactive to light.   Cardiovascular:      Rate and Rhythm: Normal rate and regular rhythm.      Pulses: Normal pulses.      Heart sounds: Normal heart sounds.   Pulmonary:      Effort: Pulmonary effort is normal.      Breath sounds: Normal breath sounds.   Abdominal:      General: Abdomen is flat. Bowel sounds are normal.      Palpations: Abdomen is soft. There is no mass.      Tenderness: There is no abdominal tenderness.      Hernia: No hernia is present.   Musculoskeletal:         General: Normal range of motion.      Cervical back: Normal range of motion and neck supple.      Right lower leg: No edema.      Left lower leg: No edema.   Skin:     General: Skin is warm and dry.      Capillary Refill: Capillary refill takes 2 to 3 seconds.      Findings: Bruising present. No  erythema.   Neurological:      General: No focal deficit present.      Mental Status: He is alert and oriented to person, place, and time.      Motor: No weakness.      Gait: Gait normal.   Psychiatric:         Mood and Affect: Mood normal.        Diagnostic Data Reviewed    Liver Biopsy dated 02/23/2006-  Diffuse fatty infiltration with focal deposits of intracytoplasmic iron granules suggestive of  hemochromatosis.  No evidence of cirrhosis.      LABORATORY:  CBC  Diff   Lab Results   Component Value Date/Time    WBC 7.0 04/23/2023 08:20 AM    HGB 15.4 04/23/2023 08:20 AM    HCT 43.3 04/23/2023 08:20 AM    PLTCNT 161 04/23/2023 08:20 AM    RBC 4.69 04/23/2023 08:20 AM    MCV 92.3 04/23/2023 08:20 AM    MCHC 35.7 04/23/2023 08:20 AM    MCH 32.9 04/23/2023 08:20 AM    RDW 13.0 04/23/2023 08:20 AM    MPV 9.9 04/23/2023 08:20 AM    Lab Results   Component Value Date/Time    PMNS 56 04/23/2023 08:20 AM    LYMPHOCYTES 23 04/23/2023 08:20 AM    EOSINOPHIL 8 04/23/2023 08:20 AM    MONOCYTES 12 04/23/2023 08:20 AM    BASOPHILS 1 04/23/2023 08:20 AM    BASOPHILS 0.00 04/23/2023 08:20 AM    PMNABS 3.90 04/23/2023 08:20 AM    LYMPHSABS 1.60 04/23/2023 08:20 AM    EOSABS 0.60 (H) 04/23/2023 08:20 AM    MONOSABS 0.80 04/23/2023 08:20 AM            Comprehensive Metabolic Profile    Lab Results   Component Value Date    SODIUM 136 04/23/2023    POTASSIUM 4.5 04/23/2023    CHLORIDE 103 04/23/2023    CO2 24 04/23/2023    ANIONGAP 9 04/23/2023    BUN 24 04/23/2023    CREATININE 1.14 04/23/2023    ALBUMIN 4.5 04/23/2023    CALCIUM 10.1 04/23/2023    GLUCOSENF 201 (H) 04/23/2023    ALKPHOS 47 04/23/2023    ALT 44 04/23/2023    AST 27 04/23/2023    TOTBILIRUBIN 0.8 04/23/2023    TOTALPROTEIN 7.5 04/23/2023         ESTIMATED GFR   Date Value Ref Range Status   04/23/2023 69 >59 mL/min/1.68m^2 Final       IRON   Date Value Ref Range Status   04/23/2023 182 50 - 212 ug/dL Final     FERRITIN   Date Value Ref Range Status   04/23/2023 33 24 - 336 ng/mL Final     TOTAL IRON BINDING CAPACITY   Date Value Ref Range Status   04/23/2023 351 250 - 450 ug/dL Final     UIBC   Date Value Ref Range Status   04/23/2023 169 130 - 375 ug/dL Final     IRON (TRANSFERRIN) SATURATION   Date Value Ref Range Status   04/23/2023 52 (H) 20 - 50 % Final     VITAMIN B 12   Date Value Ref Range Status   10/14/2021 720 180 - 914 pg/mL Final     04/23/2023 PSA  1.68    IMAGING:    07/23/22 CT Chest   IMPRESSION:  Clear lungs   Lower esophageal wall thickening raises the suspicion for esophagitis.  07/23/2022 CT Abdomen and Pelvis   Nonspecific fluid distended loops of small bowel in the mid and lower abdomen. Findings could reflect ileus in the setting of infection or early partial small bowel obstruction, though no discrete transition point is identified.   Lower esophageal wall thickening may be correlated for signs and symptoms of esophageal reflux.     07/24/22 KUB- A nonobstructive bowel gas pattern is noted     07/30/2022 KUB NO ACUTE FINDINGS     08/06/2022 X-ray T Spine- MILD DEGENERATIVE DISC DISEASE.           ASSESSMENT:   1.  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  Last therapeutic phlebotomy 1-2 years ago, per patient.     PLAN:  All relative external and internal medical records were reviewed including available H&Ps, progress notes, procedure notes, imaging's, laboratories, and pathology    1. Hereditary Hemochromatosis- PER [employer]  "Heterozygosity for HFE C282Y generally produces an unaffected carrier state with similar risk of iron overload as the general population. In some heterozygotes, concomitant liver disease may cause abnormal iron studies.   compound heterozygosity with C282Y (C282Y/H63D), generally in combination with other causes of liver disease that contribute to iron overload, such as alcohol, hepatitis C virus (HCV) infection, or metabolic dysfunction-associated steatotic liver disease   heterozygosity (C282Y/H63D) is seen in a few percent of individuals with HH, usually with mild, non-progressive iron overload and with other contributory factors such as liver disease from excess alcohol intake, HCV infection, or metabolic dysfunction-associated steatotic liver disease; such other factors need to be searched for and corrected whenever possible "  The  patient's current iron saturation is greater than 45% at 56 % today.  His ferritin though remains within normal range at 33.  No evidence of iron low at overload at this time.  We will repeat ultrasound of the abdomen to evaluate for hepatomegaly.  Returned for follow up with CBC, CMP, iron panel, ferritin 3 months.  PSA was drawn today for patient for follow up with Dr. Ewing Schlein       2. RTC 3 months     The patient was given the opportunity to ask questions, and these were answered to their satisfaction. They are welcome to call with any questions or concerns at any point.      On the day of the encounter, I spent a total of  33  minutes on this patient encounter including review of historical information, examination, documentation and post-visit activities.       Evie Lacks APRN, FNP-C 12/6/202412:03

## 2023-04-23 ENCOUNTER — Encounter (INDEPENDENT_AMBULATORY_CARE_PROVIDER_SITE_OTHER): Payer: Self-pay | Admitting: NURSE PRACTITIONER

## 2023-04-23 ENCOUNTER — Other Ambulatory Visit: Payer: Self-pay

## 2023-04-23 ENCOUNTER — Ambulatory Visit: Payer: Medicare Other | Attending: NURSE PRACTITIONER | Admitting: NURSE PRACTITIONER

## 2023-04-23 ENCOUNTER — Ambulatory Visit (INDEPENDENT_AMBULATORY_CARE_PROVIDER_SITE_OTHER)
Admission: RE | Admit: 2023-04-23 | Discharge: 2023-04-23 | Disposition: A | Discharge status: Home / Self Care | Payer: Medicare Other | Source: Ambulatory Visit | Attending: NURSE PRACTITIONER | Admitting: NURSE PRACTITIONER

## 2023-04-23 DIAGNOSIS — N400 Enlarged prostate without lower urinary tract symptoms: Secondary | ICD-10-CM

## 2023-04-23 DIAGNOSIS — K760 Fatty (change of) liver, not elsewhere classified: Secondary | ICD-10-CM | POA: Insufficient documentation

## 2023-04-23 LAB — CBC WITH DIFF
BASOPHIL #: 0 10*3/uL (ref 0.00–0.10)
BASOPHIL %: 1 % (ref 0–1)
EOSINOPHIL #: 0.6 10*3/uL — ABNORMAL HIGH (ref 0.00–0.50)
EOSINOPHIL %: 8 % (ref 1–8)
HCT: 43.3 % (ref 36.7–47.1)
HGB: 15.4 g/dL (ref 12.5–16.3)
LYMPHOCYTE #: 1.6 10*3/uL (ref 1.00–3.00)
LYMPHOCYTE %: 23 % (ref 16–44)
MCH: 32.9 pg (ref 23.8–33.4)
MCHC: 35.7 g/dL (ref 32.5–36.3)
MCV: 92.3 fL (ref 73.0–96.2)
MONOCYTE #: 0.8 10*3/uL (ref 0.30–1.00)
MONOCYTE %: 12 % (ref 5–13)
MPV: 9.9 fL (ref 7.4–11.4)
NEUTROPHIL #: 3.9 10*3/uL (ref 1.85–7.80)
NEUTROPHIL %: 56 % (ref 43–77)
PLATELETS: 161 10*3/uL (ref 140–440)
RBC: 4.69 10*6/uL (ref 4.06–5.63)
RDW: 13 % (ref 12.1–16.2)
WBC: 7 10*3/uL (ref 3.6–10.2)

## 2023-04-23 LAB — COMPREHENSIVE METABOLIC PANEL, NON-FASTING
ALBUMIN/GLOBULIN RATIO: 1.5 — ABNORMAL HIGH (ref 0.8–1.4)
ALBUMIN: 4.5 g/dL (ref 3.5–5.7)
ALKALINE PHOSPHATASE: 47 U/L (ref 34–104)
ALT (SGPT): 44 U/L (ref 7–52)
ANION GAP: 9 mmol/L (ref 4–13)
AST (SGOT): 27 U/L (ref 13–39)
BILIRUBIN TOTAL: 0.8 mg/dL (ref 0.3–1.0)
BUN/CREA RATIO: 21 (ref 6–22)
BUN: 24 mg/dL (ref 7–25)
CALCIUM, CORRECTED: 9.7 mg/dL (ref 8.9–10.8)
CALCIUM: 10.1 mg/dL (ref 8.6–10.3)
CHLORIDE: 103 mmol/L (ref 98–107)
CO2 TOTAL: 24 mmol/L (ref 21–31)
CREATININE: 1.14 mg/dL (ref 0.60–1.30)
ESTIMATED GFR: 69 mL/min/{1.73_m2} (ref >59)
GLOBULIN: 3 (ref 2.0–3.5)
GLUCOSE: 201 mg/dL — ABNORMAL HIGH (ref 74–109)
OSMOLALITY, CALCULATED: 282 mosm/kg (ref 270–290)
POTASSIUM: 4.5 mmol/L (ref 3.5–5.1)
PROTEIN TOTAL: 7.5 g/dL (ref 6.4–8.9)
SODIUM: 136 mmol/L (ref 136–145)

## 2023-04-23 LAB — IRON TRANSFERRIN AND TIBC
IRON (TRANSFERRIN) SATURATION: 52 % — ABNORMAL HIGH (ref 20–50)
IRON: 182 ug/dL (ref 50–212)
TOTAL IRON BINDING CAPACITY: 351 ug/dL (ref 250–450)
TRANSFERRIN: 251 mg/dL (ref 203–362)
UIBC: 169 ug/dL (ref 130–375)

## 2023-04-23 LAB — FERRITIN: FERRITIN: 33 ng/mL (ref 24–336)

## 2023-04-23 LAB — PSA, DIAGNOSTIC: PSA: 1.68 ng/mL (ref <=4.00)

## 2023-04-23 NOTE — Result Encounter Note (Signed)
Fax labs to Dr. Ewing Schlein I have already called patient with labs results today.    Evie Lacks APRN, FNP-C 12/6/202412:09

## 2023-04-23 NOTE — Patient Instructions (Signed)
Ultrasound of liver to evaluate the size of your liver

## 2023-05-13 ENCOUNTER — Other Ambulatory Visit: Payer: Self-pay

## 2023-05-13 ENCOUNTER — Inpatient Hospital Stay (INDEPENDENT_AMBULATORY_CARE_PROVIDER_SITE_OTHER)
Admission: RE | Admit: 2023-05-13 | Discharge: 2023-05-13 | Disposition: A | Discharge status: Home / Self Care | Payer: Medicare Other | Source: Ambulatory Visit | Attending: NURSE PRACTITIONER | Admitting: NURSE PRACTITIONER

## 2023-05-13 DIAGNOSIS — K7689 Other specified diseases of liver: Secondary | ICD-10-CM

## 2023-05-13 DIAGNOSIS — K760 Fatty (change of) liver, not elsewhere classified: Secondary | ICD-10-CM

## 2023-05-13 NOTE — Result Encounter Note (Signed)
Please call patient and inform him that his US of the liver showed:  The liver measures 14.3 cm, which is normal there was no evidence of a mass. The gallbladder is normally distended.  No gallstones were seen. There does appear to be fatty liver disease present.     Evie Lacks APRN, FNP-C 12/26/202416:18

## 2023-05-14 ENCOUNTER — Telehealth (INDEPENDENT_AMBULATORY_CARE_PROVIDER_SITE_OTHER): Payer: Self-pay | Admitting: NURSE PRACTITIONER

## 2023-05-14 NOTE — Telephone Encounter (Addendum)
Called and spoke with patient and let him know of US liver results per Guss Bunde. Let him know everything was normal but there does appear to be some fatty liver disease present. Patient voiced understanding and will follow up as scheduled.     ----- Message from Guss Bunde sent at 05/13/2023  4:18 PM EST -----  Please call patient and inform him that his US of the liver showed:  The liver measures 14.3 cm, which is normal there was no evidence of a mass. The gallbladder is normally distended.  No gallstones were seen. There does appear to be fatty liver disease present.     Evie Lacks APRN, FNP-C 12/26/202416:18

## 2023-05-17 ENCOUNTER — Other Ambulatory Visit: Payer: Self-pay

## 2023-05-17 ENCOUNTER — Ambulatory Visit (INDEPENDENT_AMBULATORY_CARE_PROVIDER_SITE_OTHER): Payer: Medicare Other | Admitting: Internal Medicine

## 2023-05-17 ENCOUNTER — Encounter (INDEPENDENT_AMBULATORY_CARE_PROVIDER_SITE_OTHER): Payer: Self-pay | Admitting: Internal Medicine

## 2023-05-17 VITALS — BP 136/78 | HR 78 | Resp 18 | Ht 67.99 in | Wt 213.0 lb

## 2023-05-17 DIAGNOSIS — Z7984 Long term (current) use of oral hypoglycemic drugs: Secondary | ICD-10-CM

## 2023-05-17 DIAGNOSIS — M778 Other enthesopathies, not elsewhere classified: Secondary | ICD-10-CM | POA: Insufficient documentation

## 2023-05-17 DIAGNOSIS — E1169 Type 2 diabetes mellitus with other specified complication: Secondary | ICD-10-CM

## 2023-05-17 DIAGNOSIS — I152 Hypertension secondary to endocrine disorders: Secondary | ICD-10-CM

## 2023-05-17 DIAGNOSIS — E1142 Type 2 diabetes mellitus with diabetic polyneuropathy: Secondary | ICD-10-CM

## 2023-05-17 DIAGNOSIS — E782 Mixed hyperlipidemia: Secondary | ICD-10-CM

## 2023-05-17 DIAGNOSIS — F331 Major depressive disorder, recurrent, moderate: Secondary | ICD-10-CM

## 2023-05-17 DIAGNOSIS — E1159 Type 2 diabetes mellitus with other circulatory complications: Secondary | ICD-10-CM

## 2023-05-17 MED ORDER — TRIAMCINOLONE ACETONIDE 40 MG/ML SUSPENSION FOR INJECTION
40.0000 mg | INTRAMUSCULAR | Status: AC
Start: 2023-05-17 — End: 2023-05-17
  Administered 2023-05-17: 40 mg via INTRA_ARTICULAR

## 2023-05-17 MED ORDER — LIDOCAINE (PF) 10 MG/ML (1 %) INJECTION SOLUTION
2.0000 mL | Freq: Once | INTRAMUSCULAR | Status: AC
Start: 2023-05-17 — End: 2023-05-17
  Administered 2023-05-17: 2 mL via INTRAMUSCULAR

## 2023-05-17 NOTE — Addendum Note (Signed)
Addended byKatrinka Blazing, Charnice Zwilling on: 05/17/2023 01:10 PM     Modules accepted: Orders

## 2023-05-17 NOTE — Progress Notes (Addendum)
INTERNAL MEDICINE, [facility]  [address]  [place] [address]       Name: Steven Henderson MRN:  [id number]   Date: 05/17/2023 Age: 72 y.o.       Chief Complaint:    Chief Complaint   Patient presents with    Follow Up 3 Months     3 month f/u for chronic disease management.     Shoulder Pain    Lower Back Pain       HPI:  Steven Henderson is a 72 y.o. male who presents to the office today via follow-up of their diabetic control. Patient is having some minor issues as described below which I have addressed in total with the patient.    Past Medical History:  Past Medical History:   Diagnosis Date    Anxiety state     BPH (benign prostatic hyperplasia)     Depression, acute     Diabetes mellitus, type 2 (CMS HCC)     Elevated PSA     Generalized OA     Hemochromatosis     Hyperlipidemia     Hypertension     Hypomagnesemia     Insomnia     Syncope     Testicular hypofunction     Vitamin D deficiency          Past Surgical History:   Procedure Laterality Date    CYSTOSCOPY  06/23/2017    FINGER SURGERY Left     RING FINGER    HAND SURGERY Right     HX APPENDECTOMY        Current Outpatient Medications   Medication Sig    amLODIPine (NORVASC) 10 mg Oral Tablet Take 1 Tablet (10 mg total) by mouth Once a day    aspirin (ECOTRIN) 81 mg Oral Tablet, Delayed Release (E.C.) Take 1 Tablet (81 mg total) by mouth Once a day    finasteride (PROSCAR) 5 mg Oral Tablet Take 1 Tablet (5 mg total) by mouth Once a day    glimepiride (AMARYL) 4 mg Oral Tablet Take 1 Tablet (4 mg total) by mouth Twice daily    hydroCHLOROthiazide (HYDRODIURIL) 25 mg Oral Tablet Take 1 Tablet (25 mg total) by mouth Once a day    meloxicam (MOBIC) 15 mg Oral Tablet TAKE (1) TABLET DAILY WITH FOOD.    MetFORMIN (GLUCOPHAGE) 1,000 mg Oral Tablet Take 1 Tablet (1,000 mg total) by mouth Twice daily with food    rosuvastatin (CRESTOR) 10 mg Oral Tablet Take 1 Tablet (10 mg total) by mouth Every evening    tamsulosin (FLOMAX) 0.4 mg Oral  Capsule Take 1 Capsule (0.4 mg total) by mouth Every evening after dinner    telmisartan (MICARDIS) 80 mg Oral Tablet Take 1 Tablet (80 mg total) by mouth Once a day     No Known Allergies    Family History:  Family Medical History:       Problem Relation (Age of Onset)    Liver Cancer Father    Stroke Mother              Social History:   Social History     Tobacco Use   Smoking Status Never    Passive exposure: Never   Smokeless Tobacco Never     Social History     Substance and Sexual Activity   Alcohol Use Not Currently     Social History     Occupational History    Not  on file       Review of Systems:  Review of systems as discussed in HPI      Problem List:  Patient Active Problem List   Diagnosis    Anxiety state    BPH (benign prostatic hyperplasia)    Depression, acute    Diabetes mellitus, type 2 (CMS HCC)    Elevated PSA    Generalized OA    Hypertension    Hypomagnesemia    Insomnia    Major depression, recurrent (CMS HCC)    Chronic bilateral thoracic back pain    Hypertension associated with type 2 diabetes mellitus (CMS HCC)  (CMS HCC)    Fatigue    Hereditary hemochromatosis (CMS HCC)    Dupuytren's contracture of both hands    Ileus (CMS HCC)    Chronic cough    Partial small bowel obstruction (CMS HCC)    Dehydration    Colon cancer screening    Scapulothoracic syndrome    Chronic right shoulder pain    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   Constitutional:       General: He is not in acute distress.     Appearance: Normal appearance.   HENT:      Head: Normocephalic.      Right Ear: Tympanic membrane normal.      Left Ear: Tympanic membrane normal.      Nose: Nose normal.       Mouth/Throat:      Mouth: Mucous membranes are moist.   Eyes:      General: No scleral icterus.     Extraocular Movements: Extraocular movements intact.      Conjunctiva/sclera: Conjunctivae normal.      Pupils: Pupils are equal, round, and reactive to light.   Neck:      Vascular: No carotid bruit.   Cardiovascular:      Rate and Rhythm: Normal rate and regular rhythm.      Pulses: Normal pulses.           Dorsalis pedis pulses are 2+ on the right side and 2+ on the left side.        Posterior tibial pulses are 2+ on the right side and 2+ on the left side.      Heart sounds: Normal heart sounds.   Pulmonary:      Effort: Pulmonary effort is normal.      Breath sounds: Normal breath sounds. No wheezing, rhonchi or rales.   Abdominal:      General: Abdomen is flat. Bowel sounds are normal.      Tenderness: There is no abdominal tenderness. There is no guarding.   Musculoskeletal:         General: No swelling. Normal range of motion.      Cervical back: Normal range of motion. No tenderness.      Right lower leg: No edema.      Left lower leg: No edema.   Skin:     General: Skin is warm.  Capillary Refill: Capillary refill takes less than 2 seconds.      Findings: Ecchymosis present.   Neurological:      General: No focal deficit present.      Mental Status: He is alert and oriented to person, place, and time.      Cranial Nerves: No cranial nerve deficit.            Health Maintenance:  Health Maintenance   Topic Date Due    Influenza Vaccine (1) 06/16/2023 (Originally 01/17/2023)    Diabetic A1C  07/07/2023    Medicare Annual Wellness Visit  09/07/2023    Diabetic Kidney Health Microalb/Cr Ratio  01/04/2024    Diabetic Retinal Exam  04/16/2024    Diabetic Kidney Health eGFR  04/22/2024    Colonoscopy  11/24/2032    Pneumococcal Vaccination, Age 29+  Completed    Adult Tdap-Td  Discontinued    Hepatitis C screening  Discontinued    Shingles Vaccine  Discontinued    Covid-19 Vaccine  Discontinued    Prostate Cancer  Screening  Discontinued        Assessment/Plan:    I have reviewed the most recent labs with the patient and all questions answered to patients satisfaction.       ICD-10-CM    1. Shoulder tendonitis, right  M77.8 Large Joint Arthrocentesis: R glenohumeral     lidocaine PF (XYLOCAINE-MPF) 1% injection     triamcinolone acetonide (KENALOG-40) 40 mg/mL injection      2. 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     E78.2       3. Hypertension associated with type 2 diabetes mellitus (CMS HCC)  (CMS HCC)  E11.59 Blood Pressure today is stable and will continue current treatment plans     I15.2       4. 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 I will contact patient and address therapy options once completed.       5. 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        6. 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  We have discussed whether there is need for any therapeutic changes and the patient states that they are tolerating current treatments and will just continue with current lifestyle.          Orders Placed This Encounter    Large Joint Arthrocentesis: R glenohumeral    lidocaine PF (XYLOCAINE-MPF) 1% injection    triamcinolone acetonide (KENALOG-40) 40 mg/mL injection     There are no discontinued medications.           Follow up:  Return in about 6 weeks (around 06/28/2023).    This note was partially created using voice recognition software and is inherently subject to errors including those of syntax and "sound alike " substitutions which may escape proof reading.  In such instances, original meaning may be  extrapolated by contextual derivation.    Lucia Gaskins, DO  05/17/2023, 12:48

## 2023-05-17 NOTE — Procedures (Signed)
INTERNAL MEDICINE, [facility]  [address]  [place] [address]    Procedure Note    Name: Steven Henderson MRN:  [id number]   Date: 05/17/2023 DOB:  01-21-1951 (72 y.o.)         Large Joint Arthrocentesis: R glenohumeral  Indications: pain  Details: 22 G needle, posterior approach  Outcome: tolerated well, no immediate complications  Procedure, treatment alternatives, risks and benefits explained, specific risks discussed. Immediately prior to procedure a time out was called to verify the correct patient, procedure, equipment, support staff and site/side marked as required. Patient was prepped and draped in the usual sterile fashion.           Lucia Gaskins, DO

## 2023-06-02 ENCOUNTER — Other Ambulatory Visit (INDEPENDENT_AMBULATORY_CARE_PROVIDER_SITE_OTHER): Payer: Self-pay | Admitting: Internal Medicine

## 2023-06-18 ENCOUNTER — Ambulatory Visit (INDEPENDENT_AMBULATORY_CARE_PROVIDER_SITE_OTHER): Payer: Medicare Other | Admitting: NEUROLOGY

## 2023-06-18 ENCOUNTER — Other Ambulatory Visit: Payer: Self-pay

## 2023-06-18 DIAGNOSIS — R200 Anesthesia of skin: Secondary | ICD-10-CM

## 2023-06-18 NOTE — Procedures (Signed)
NEUROLOGY, [facility]  [address]  [place] [address]    Procedure Note    Name: Steven Henderson MRN:  [id number]   Date: 06/18/2023 DOB:  02-20-1951 (73 y.o.)         [address]60 - NEEDLE ELECTROMYOGRAPHY; 1 EXTREMITY W/ OR W/O RELATED PARASPINAL AREAS (AMB ONLY)    Date/Time: 06/18/2023 9:46 AM    Performed by: Dyke Brackett, DO  Authorized by: Dyke Brackett, DO        ELECTROMYOGRAPHY REPORT    DATE OF SERVICE:  06/18/2023      PERFORMING PHYSICIAN:  Birdie Hopes, D.O.      REFERRING PHYSICIAN:  Birdie Hopes, D.O.    HISTORY:  He is a 74 year old man who is referred for electrodiagnostic testing for longstanding right shoulder pain and paresthesias. He reports recently receiving steroid injections with PCP which has helped symptoms. See initial visit note from February 18, 2023 for further details.     EXAMINATION:  See initial visit note from February 18, 2023 for detailed exam.     NERVE CONDUCTION STUDIES:  1.Normal right median-D2, ulnar-D5, and radial-snuff box sensory nerve action potentials (SNAPs).  2.Normal right median-APB,  compound muscle action potentials (CMAPs).  3.       Normal right ulnar-ADM motor amplitudes at the wrist with mild reduction proximally with relative slowing across the elbow segment and normal distal motor latencies.     ELECTROMYOGRAPHY:  Normal studies for the right deltoid, biceps, triceps, pronator teres and first dorsal interosseous (FDI).    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   There is no electrophysiologic evidence of a cervical radiculopathy affecting the right upper extremity at this time.     Patrick North, DO  Assistant Professor of Neurology  [facility]

## 2023-06-29 ENCOUNTER — Ambulatory Visit (INDEPENDENT_AMBULATORY_CARE_PROVIDER_SITE_OTHER): Payer: Medicare Other | Admitting: Internal Medicine

## 2023-06-29 ENCOUNTER — Encounter (INDEPENDENT_AMBULATORY_CARE_PROVIDER_SITE_OTHER): Payer: Self-pay | Admitting: Internal Medicine

## 2023-06-29 ENCOUNTER — Other Ambulatory Visit: Payer: Self-pay

## 2023-06-29 VITALS — BP 125/73 | HR 88 | Ht 67.0 in | Wt 207.0 lb

## 2023-06-29 DIAGNOSIS — F331 Major depressive disorder, recurrent, moderate: Secondary | ICD-10-CM | POA: Insufficient documentation

## 2023-06-29 DIAGNOSIS — M25511 Pain in right shoulder: Secondary | ICD-10-CM

## 2023-06-29 DIAGNOSIS — R338 Other retention of urine: Secondary | ICD-10-CM

## 2023-06-29 DIAGNOSIS — G8929 Other chronic pain: Secondary | ICD-10-CM

## 2023-06-29 DIAGNOSIS — I152 Hypertension secondary to endocrine disorders: Secondary | ICD-10-CM

## 2023-06-29 DIAGNOSIS — E1159 Type 2 diabetes mellitus with other circulatory complications: Secondary | ICD-10-CM

## 2023-06-29 DIAGNOSIS — E1142 Type 2 diabetes mellitus with diabetic polyneuropathy: Secondary | ICD-10-CM

## 2023-06-29 DIAGNOSIS — E782 Mixed hyperlipidemia: Secondary | ICD-10-CM

## 2023-06-29 DIAGNOSIS — M778 Other enthesopathies, not elsewhere classified: Secondary | ICD-10-CM

## 2023-06-29 DIAGNOSIS — E1169 Type 2 diabetes mellitus with other specified complication: Secondary | ICD-10-CM

## 2023-06-29 DIAGNOSIS — Z7984 Long term (current) use of oral hypoglycemic drugs: Secondary | ICD-10-CM

## 2023-06-29 NOTE — Progress Notes (Signed)
INTERNAL MEDICINE, [facility]  [address]   [place] [address]       Name: Steven Henderson MRN:  [id number]   Date: 06/29/2023 Age: 73 y.o.       Chief Complaint:    Chief Complaint   Patient presents with    Follow Up 3 Months        HPI:  Steven Henderson is a 73 y.o. male who presents to the office today for follow-up.  Patient is having some minor issues as described below which I have addressed in total with the patient.        Past Medical History:  Past Medical History:   Diagnosis Date    Anxiety state     BPH (benign prostatic hyperplasia)     Depression, acute     Diabetes mellitus, type 2 (CMS HCC)     Elevated PSA     Generalized OA     Hemochromatosis     Hyperlipidemia     Hypertension     Hypomagnesemia     Insomnia     Syncope     Testicular hypofunction     Vitamin D deficiency          Past Surgical History:   Procedure Laterality Date    CYSTOSCOPY  06/23/2017    FINGER SURGERY Left     RING FINGER    HAND SURGERY Right     HX APPENDECTOMY        Current Outpatient Medications   Medication Sig    amLODIPine (NORVASC) 10 mg Oral Tablet Take 1 Tablet (10 mg total) by mouth Once a day    aspirin (ECOTRIN) 81 mg Oral Tablet, Delayed Release (E.C.) Take 1 Tablet (81 mg total) by mouth Once a day    finasteride (PROSCAR) 5 mg Oral Tablet Take 1 Tablet (5 mg total) by mouth Once a day    glimepiride (AMARYL) 4 mg Oral Tablet Take 1 Tablet (4 mg total) by mouth Twice daily    hydroCHLOROthiazide (HYDRODIURIL) 25 mg Oral Tablet Take 1 Tablet (25 mg total) by mouth Once a day    meloxicam (MOBIC) 15 mg Oral Tablet TAKE (1) TABLET DAILY WITH FOOD.    MetFORMIN (GLUCOPHAGE) 1,000 mg Oral Tablet Take 1 Tablet (1,000 mg total) by mouth Twice daily with food    rosuvastatin (CRESTOR) 10 mg Oral Tablet Take 1 Tablet (10 mg total) by mouth Every evening    tamsulosin (FLOMAX) 0.4 mg Oral Capsule TAKE 1 CAPSULE AT BEDTIME    telmisartan (MICARDIS) 80 mg Oral Tablet Take 1 Tablet (80 mg total)  by mouth Once a day     No Known Allergies    Family History:  Family Medical History:       Problem Relation (Age of Onset)    Liver Cancer Father    Stroke Mother              Social History:   Social History     Tobacco Use   Smoking Status Never    Passive exposure: Never   Smokeless Tobacco Never     Social History     Substance and Sexual Activity   Alcohol Use Not Currently     Social History     Occupational History    Not on file       Review of Systems:  Review of systems as discussed in HPI    Problem List:  Patient Active Problem  List   Diagnosis    Anxiety state    BPH (benign prostatic hyperplasia)    Depression, acute    Diabetes mellitus, type 2 (CMS HCC)    Elevated PSA    Generalized OA    Hypomagnesemia    Insomnia    Major depression, recurrent (CMS HCC)    Chronic bilateral thoracic back pain    Hypertension associated with type 2 diabetes mellitus (CMS HCC)  (CMS HCC)    Fatigue    Hereditary hemochromatosis (CMS HCC)    Dupuytren's contracture of both hands    Ileus (CMS HCC)    Chronic cough    Partial small bowel obstruction (CMS HCC)    Dehydration    Colon cancer screening    Scapulothoracic syndrome    Chronic right shoulder pain    DM type 2 with diabetic mixed hyperlipidemia (CMS HCC)  (CMS HCC)    Syncope    Shoulder tendonitis, right    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   Constitutional:       General: He is not in acute distress.     Appearance: Normal appearance.   HENT:      Head: Normocephalic.      Right Ear: Tympanic membrane normal.      Left Ear: Tympanic membrane normal.      Nose: Nose normal.      Mouth/Throat:      Mouth: Mucous membranes are moist.   Eyes:      General: No scleral icterus.      Extraocular Movements: Extraocular movements intact.      Conjunctiva/sclera: Conjunctivae normal.      Pupils: Pupils are equal, round, and reactive to light.   Neck:      Vascular: No carotid bruit.   Cardiovascular:      Rate and Rhythm: Normal rate and regular rhythm.      Pulses: Normal pulses.           Dorsalis pedis pulses are 2+ on the right side and 2+ on the left side.        Posterior tibial pulses are 2+ on the right side and 2+ on the left side.      Heart sounds: Normal heart sounds.   Pulmonary:      Effort: Pulmonary effort is normal.      Breath sounds: Normal breath sounds. No wheezing, rhonchi or rales.   Abdominal:      General: Abdomen is flat. Bowel sounds are normal.      Tenderness: There is no abdominal tenderness. There is no guarding.   Musculoskeletal:         General: No swelling. Normal range of motion.      Cervical back: Normal range of motion. No tenderness.      Right lower leg: No edema.      Left lower leg: No edema.   Skin:     General: Skin is warm.      Capillary Refill: Capillary refill takes less than 2 seconds.      Findings: Ecchymosis present.   Neurological:  General: No focal deficit present.      Mental Status: He is alert and oriented to person, place, and time.      Cranial Nerves: No cranial nerve deficit.              Health Maintenance:  Health Maintenance   Topic Date Due    Influenza Vaccine (1) 07/29/2023 (Originally 01/17/2023)    Diabetic A1C  07/07/2023    Medicare Annual Wellness Visit  09/07/2023    Diabetic Kidney Health Microalb/Cr Ratio  01/04/2024    Diabetic Retinal Exam  04/16/2024    Diabetic Kidney Health eGFR  04/22/2024    Colonoscopy  11/24/2032    Pneumococcal Vaccination, Age 73+  Completed    Adult Tdap-Td  Discontinued    Hepatitis C screening  Discontinued    Shingles Vaccine  Discontinued    Covid-19 Vaccine  Discontinued    Prostate Cancer Screening  Discontinued        Assessment:      ICD-10-CM    1. 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        2. DM type 2 with diabetic mixed hyperlipidemia (CMS HCC)  (CMS HCC)  E11.69 Labs ordered here and on this visit will be be addressed by me. I will contact patient and address therapy options once completed.     E78.2       3. Hypertension associated with type 2 diabetes mellitus (CMS HCC)  (CMS HCC)  E11.59 Labs ordered here and on this visit will be be addressed by me. I will contact patient and address therapy options once completed.     I15.2       4. 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 I will contact patient and address therapy options once completed.       5. Hereditary hemochromatosis (CMS HCC)  E83.110 Labs ordered here and on this visit will be be addressed by me. I will contact patient and address therapy options once completed.       6. Chronic right shoulder pain  M25.511 Labs ordered here and on this visit will be be addressed by me. I will contact patient and address therapy options once completed.     G89.29       7. Shoulder tendonitis, right  M77.8 Labs ordered here and on this visit will be be addressed by me. I will contact patient and address therapy options once completed.       8. Benign prostatic hyperplasia with urinary retention  N40.1     R33.8          No orders of the defined types were placed in this encounter.    There are no discontinued medications.           Current diabetes medications:   Active Diabetes Meds   Medication Sig    glimepiride (AMARYL) 4 mg Oral Tablet Take 1 Tablet (4 mg total) by mouth Twice daily    MetFORMIN (GLUCOPHAGE) 1,000 mg Oral Tablet Take 1 Tablet (1,000 mg total) by mouth Twice daily with food            Follow up:  Return in about 6 weeks (around 08/10/2023).    This note was partially created using voice recognition  software and is  inherently subject to errors including those of syntax and "sound alike " substitutions which may escape proof reading.  In such instances, original meaning may be extrapolated by contextual derivation.    Lucia Gaskins, DO  06/29/2023, 11:14

## 2023-07-08 ENCOUNTER — Encounter (INDEPENDENT_AMBULATORY_CARE_PROVIDER_SITE_OTHER): Payer: Self-pay | Admitting: INTERNAL MEDICINE

## 2023-07-08 ENCOUNTER — Ambulatory Visit (INDEPENDENT_AMBULATORY_CARE_PROVIDER_SITE_OTHER): Payer: Medicare Other | Admitting: INTERNAL MEDICINE

## 2023-07-08 ENCOUNTER — Other Ambulatory Visit: Payer: Self-pay

## 2023-07-08 VITALS — BP 127/72 | HR 90 | Resp 18 | Ht 67.0 in | Wt 209.8 lb

## 2023-07-08 DIAGNOSIS — M9907 Segmental and somatic dysfunction of upper extremity: Secondary | ICD-10-CM

## 2023-07-08 DIAGNOSIS — M778 Other enthesopathies, not elsewhere classified: Secondary | ICD-10-CM

## 2023-07-08 DIAGNOSIS — G8929 Other chronic pain: Secondary | ICD-10-CM

## 2023-07-08 DIAGNOSIS — M25511 Pain in right shoulder: Secondary | ICD-10-CM

## 2023-07-08 DIAGNOSIS — M546 Pain in thoracic spine: Secondary | ICD-10-CM

## 2023-07-08 DIAGNOSIS — G5680 Other specified mononeuropathies of unspecified upper limb: Secondary | ICD-10-CM

## 2023-07-08 DIAGNOSIS — M9902 Segmental and somatic dysfunction of thoracic region: Secondary | ICD-10-CM

## 2023-07-08 DIAGNOSIS — M9901 Segmental and somatic dysfunction of cervical region: Secondary | ICD-10-CM

## 2023-07-08 NOTE — Nursing Note (Signed)
Patient is here for right shoulder manipulation only.

## 2023-07-08 NOTE — Progress Notes (Signed)
[facility] Internal Medicine  [address] [place], [place]  [address]  Phone: [phone] Fax: [phone]    Name: Alaster Asfaw                       Date of Birth: 04/15/1951   MRN:  [id number]                         Date of visit: 07/08/2023     Chief Complaint: Shoulder Pain (Manipulation of right shoulder)    Current Outpatient Medications   Medication Sig    amLODIPine (NORVASC) 10 mg Oral Tablet Take 1 Tablet (10 mg total) by mouth Once a day    aspirin (ECOTRIN) 81 mg Oral Tablet, Delayed Release (E.C.) Take 1 Tablet (81 mg total) by mouth Once a day    finasteride (PROSCAR) 5 mg Oral Tablet Take 1 Tablet (5 mg total) by mouth Once a day    glimepiride (AMARYL) 4 mg Oral Tablet Take 1 Tablet (4 mg total) by mouth Twice daily    hydroCHLOROthiazide (HYDRODIURIL) 25 mg Oral Tablet Take 1 Tablet (25 mg total) by mouth Once a day    meloxicam (MOBIC) 15 mg Oral Tablet TAKE (1) TABLET DAILY WITH FOOD.    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)  (CMS HCC) 01/06/2023    Scapulothoracic syndrome 09/23/2022    Chronic right shoulder pain 09/23/2022    Colon cancer screening 09/07/2022    Partial small bowel obstruction (CMS HCC) 07/24/2022    Dehydration 07/24/2022    Ileus (CMS HCC) 07/23/2022    Chronic cough 07/23/2022    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    Dupuytren's contracture of both hands 05/26/2022    Hypertension  associated with type 2 diabetes mellitus (CMS HCC)  (CMS HCC) 02/04/2022    Fatigue 02/04/2022    Anxiety state 10/14/2021    BPH (benign prostatic hyperplasia) 10/14/2021    Depression, acute 10/14/2021    Diabetes mellitus, type 2 (CMS HCC) 10/14/2021    Elevated PSA 10/14/2021    Generalized OA 10/14/2021    Hypomagnesemia 10/14/2021    Insomnia 10/14/2021    Major depression, 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/2021    Chronic bilateral thoracic back pain 10/14/2021       Subjective:   Patient is here for evaluation for osteopathic manipulative treatment. Patient typically follows with Dr Katrinka Blazing.     Has been having right shoulder pain and radicular symptoms. Workup so far has been largely negative other than degenerative changes.   EMG notable only for mild cubital tunnel syndrome.   We were requested to evaluate and treat by Dr Katrinka Blazing.     ROS:  10 systems reviewed and were negative except as noted.  Objective :  BP 127/72 (Site: Left Arm, Patient Position: Sitting, Cuff Size: Adult)   Pulse 90   Resp 18   Ht 1.702 m (5\' 7" )   Wt 95.2 kg (209 lb 12.8 oz)   SpO2 96%   BMI 32.86 kg/m       General:  appears in good health, comfortable  Lungs:  Breathing nonlabored, Clear to auscultation bilaterally.   Cardiovascular:  regular rate and rhythm, S1, S2 normal, no murmur, click, rub or gallop  Somatic dysfunction of cervical spine  Somatic dysfunction of left shoulder  Somatic dysfunction of thoracic spine    Data reviewed:  Notes from Dr Katrinka Blazing  Available Imaging    Assessment/Plan:  Assessment/Plan   1. Scapulothoracic syndrome    2. Shoulder tendonitis, right    3. Chronic right shoulder pain    4. Chronic bilateral thoracic back pain    5. Somatic dysfunction of spine, cervical    6. Somatic dysfunction of spine, thoracic    7. Somatic dysfunction of upper extremity      OMT to 3 regions.    Direct and Indirect muscle energy to cervical spine, thoracic spine, and left shoulder.  Counter strain to left  shoulder  Soft tissue left shoulder and thoracic spine  HVLA to cervical spine and thoracic spine    Stretching exercises redemonstrated and patient reminded to reguarly perform  Can have patient back to repeat treatment PRN      Terald Sleeper, DO, [facility]  Internal Medicine

## 2023-07-22 ENCOUNTER — Ambulatory Visit: Payer: Self-pay | Attending: NURSE PRACTITIONER | Admitting: NURSE PRACTITIONER

## 2023-07-22 ENCOUNTER — Ambulatory Visit (INDEPENDENT_AMBULATORY_CARE_PROVIDER_SITE_OTHER): Payer: Self-pay | Admitting: NURSE PRACTITIONER

## 2023-07-22 ENCOUNTER — Ambulatory Visit (INDEPENDENT_AMBULATORY_CARE_PROVIDER_SITE_OTHER)
Admission: RE | Admit: 2023-07-22 | Discharge: 2023-07-22 | Disposition: A | Discharge status: Home / Self Care | Payer: Self-pay | Source: Ambulatory Visit | Attending: NURSE PRACTITIONER | Admitting: NURSE PRACTITIONER

## 2023-07-22 ENCOUNTER — Other Ambulatory Visit: Payer: Self-pay

## 2023-07-22 ENCOUNTER — Encounter (INDEPENDENT_AMBULATORY_CARE_PROVIDER_SITE_OTHER): Payer: Self-pay | Admitting: NURSE PRACTITIONER

## 2023-07-22 DIAGNOSIS — K760 Fatty (change of) liver, not elsewhere classified: Secondary | ICD-10-CM

## 2023-07-22 LAB — CBC WITH DIFF
BASOPHIL #: 0 10*3/uL (ref 0.00–0.10)
BASOPHIL %: 1 % (ref 0–1)
EOSINOPHIL #: 0.2 10*3/uL (ref 0.00–0.50)
EOSINOPHIL %: 3 % (ref 1–8)
HCT: 43.7 % (ref 36.7–47.1)
HGB: 15.2 g/dL (ref 12.5–16.3)
LYMPHOCYTE #: 1.4 10*3/uL (ref 1.00–3.20)
LYMPHOCYTE %: 21 % (ref 15–43)
MCH: 32.1 pg (ref 23.8–33.4)
MCHC: 34.7 g/dL (ref 32.5–36.3)
MCV: 92.3 fL (ref 73.0–96.2)
MONOCYTE #: 0.9 10*3/uL (ref 0.30–1.10)
MONOCYTE %: 13 % (ref 6–14)
MPV: 9.9 fL (ref 7.4–11.4)
NEUTROPHIL #: 4.4 10*3/uL (ref 1.70–7.60)
NEUTROPHIL %: 63 % (ref 44–74)
PLATELETS: 193 10*3/uL (ref 140–440)
RBC: 4.73 10*6/uL (ref 4.06–5.63)
RDW: 13.5 % (ref 12.1–16.2)
WBC: 6.9 10*3/uL (ref 3.6–10.2)

## 2023-07-22 LAB — COMPREHENSIVE METABOLIC PANEL, NON-FASTING
ALBUMIN/GLOBULIN RATIO: 1.6 — ABNORMAL HIGH (ref 0.8–1.4)
ALBUMIN: 4.4 g/dL (ref 3.5–5.7)
ALKALINE PHOSPHATASE: 46 U/L (ref 34–104)
ALT (SGPT): 38 U/L (ref 7–52)
ANION GAP: 4 mmol/L (ref 4–13)
AST (SGOT): 25 U/L (ref 13–39)
BILIRUBIN TOTAL: 0.7 mg/dL (ref 0.3–1.0)
BUN/CREA RATIO: 23 — ABNORMAL HIGH (ref 6–22)
BUN: 28 mg/dL — ABNORMAL HIGH (ref 7–25)
CALCIUM, CORRECTED: 9.8 mg/dL (ref 8.9–10.8)
CALCIUM: 10.1 mg/dL (ref 8.6–10.3)
CHLORIDE: 104 mmol/L (ref 98–107)
CO2 TOTAL: 27 mmol/L (ref 21–31)
CREATININE: 1.24 mg/dL (ref 0.60–1.30)
ESTIMATED GFR: 62 mL/min/{1.73_m2} (ref >59)
GLOBULIN: 2.8 (ref 2.0–3.5)
GLUCOSE: 203 mg/dL — ABNORMAL HIGH (ref 74–109)
OSMOLALITY, CALCULATED: 282 mosm/kg (ref 270–290)
POTASSIUM: 4.3 mmol/L (ref 3.5–5.1)
PROTEIN TOTAL: 7.2 g/dL (ref 6.4–8.9)
SODIUM: 135 mmol/L — ABNORMAL LOW (ref 136–145)

## 2023-07-22 LAB — IRON TRANSFERRIN AND TIBC
IRON (TRANSFERRIN) SATURATION: 36 % (ref 20–50)
IRON: 120 ug/dL (ref 50–212)
TOTAL IRON BINDING CAPACITY: 335 ug/dL (ref 250–450)
TRANSFERRIN: 239 mg/dL (ref 203–362)
UIBC: 215 ug/dL (ref 130–375)

## 2023-07-22 LAB — FERRITIN: FERRITIN: 47 ng/mL (ref 24–336)

## 2023-07-22 NOTE — Telephone Encounter (Addendum)
 Called and let patient know about lab result per Guss Bunde. Patient voiced understanding    ----- Message from Guss Bunde, FNP-C sent at 07/22/2023 11:49 AM EST -----  Call patient and let him know his ferritin is normal at 47.      Evie Lacks APRN, FNP-C 3/6/202511:49

## 2023-07-22 NOTE — Cancer Center Note (Addendum)
 [facility]       [facility]       CANCER CENTER NOTE     Date: 07/22/2023  Name: Steven Henderson  MRN: [id number]  Referring Physician: Rogers Seeds, MD  Primary Care Provider: Lucia Gaskins, DO    REASON FOR VISIT:   Chief Complaint   Patient presents with    Hemachromatosis        HISTORY OF PRESENT ILLNESS:   73 y.o.male [facility] [id number] for evaluation and management of hemochromatosis.  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   He has been undergoing phlebotomy treatments which he has been tolerating very well.      His last phlebotomy was in February 2022 which he tolerated really well.  He did not need a phlebotomy after that because of low ferritin.    07/22/2023 Today the patient returns for follow up regarding his hemochromatosis today. He has not required phlebotomy for some time.     REVIEW OF SYSTEMS:  Review of Systems   Constitutional:  Negative for appetite change, chills, fatigue and fever.   HENT:  Negative.     Eyes:  Positive for eye problems (worsening Right eye only under care of Opthamologist).   Respiratory:  Positive for cough (post Flu infection). Negative for shortness of breath.    Cardiovascular: Negative.  Negative for chest pain, leg swelling and palpitations.   Gastrointestinal:  Positive for abdominal pain (intermittently to the lower abdoemen that is described as gnawing pain worse in the am, with some nausea) and nausea (in the am). Negative for blood in stool, constipation, diarrhea and vomiting.   Genitourinary:  Positive for difficulty urinating (slow urination unchanged). Negative for hematuria.    Musculoskeletal:  Positive for arthralgias (generalized, Right shoulder blade) and back pain.   Skin: Negative.    Neurological:  Positive for dizziness, light-headedness and numbness (to fingers Carpal tunnel).   Hematological:  Negative for adenopathy. Bruises/bleeds easily.    Psychiatric/Behavioral:  Negative for depression and sleep disturbance. The patient is not nervous/anxious.          PAST MEDICAL HISTORY:  Past Medical History:   Diagnosis Date    Anxiety state     BPH (benign prostatic hyperplasia)     Depression, acute     Diabetes mellitus, type 2 (CMS HCC)     Elevated PSA     Generalized OA     Hemochromatosis     Hyperlipidemia     Hypertension     Hypomagnesemia     Insomnia     Syncope     Testicular hypofunction     Vitamin D deficiency      MEDICATIONS:  Current Outpatient Medications   Medication Sig    amLODIPine (NORVASC) 10 mg Oral Tablet Take 1 Tablet (10 mg total) by mouth Once a day    aspirin (ECOTRIN) 81 mg Oral Tablet, Delayed Release (E.C.) Take 1 Tablet (81 mg total) by mouth Once a day    finasteride (PROSCAR) 5 mg Oral Tablet Take 1 Tablet (5 mg total) by mouth Once a day    glimepiride (AMARYL) 4 mg Oral Tablet Take 1 Tablet (4 mg total) by mouth Twice daily    hydroCHLOROthiazide (HYDRODIURIL) 25 mg Oral Tablet Take 1 Tablet (25 mg total) by mouth Once a day    meloxicam (MOBIC) 15 mg Oral Tablet TAKE (1) TABLET DAILY WITH FOOD.    MetFORMIN (GLUCOPHAGE)  1,000 mg Oral Tablet Take 1 Tablet (1,000 mg total) by mouth Twice daily with food    tamsulosin (FLOMAX) 0.4 mg Oral Capsule TAKE 1 CAPSULE AT BEDTIME    telmisartan (MICARDIS) 80 mg Oral Tablet Take 1 Tablet (80 mg total) by mouth Once a day     ALLERGIES:  No Known Allergies  PAST SURGICAL HISTORY:  Past Surgical History:   Procedure Laterality Date    CYSTOSCOPY  06/23/2017    FINGER SURGERY Left     RING FINGER    HAND SURGERY Right     HX APPENDECTOMY       SOCIAL HISTORY:  Social History     Tobacco Use   Smoking Status Never    Passive exposure: Never   Smokeless Tobacco Never      E-Cigarette Vaping History:  E-Cigarette/Vaping Use: Never User    Substances vaped:  Nicotine: No  CBD: No    Additional information:  Disposable: No  Refillable Tank: No     FAMILY HISTORY:  Family Medical History:        Problem Relation (Age of Onset)    Liver Cancer Father    Stroke Mother          PHYSICAL EXAMINATION:    Vitals:    07/22/23 0921   BP: 124/76   Pulse: 93   Temp: 36.8 C (98.2 F)   TempSrc: Temporal   SpO2: 98%   Weight: 91.9 kg (202 lb 11.2 oz)   Height: 1.702 m (5\' 7" )   BMI: 31.75       Body mass index is 31.75 kg/m.    ECOG PS 0  Physical Exam  Constitutional:       General: He is not in acute distress.     Appearance: Normal appearance.   HENT:      Head: Normocephalic and atraumatic.      Nose: Nose normal.      Mouth/Throat:      Mouth: Mucous membranes are moist.      Pharynx: Oropharynx is clear.   Eyes:      General: No scleral icterus.        Right eye: No discharge.         Left eye: No discharge.      Pupils: Pupils are equal, round, and reactive to light.   Cardiovascular:      Rate and Rhythm: Normal rate and regular rhythm.      Pulses: Normal pulses.      Heart sounds: Normal heart sounds.   Pulmonary:      Effort: Pulmonary effort is normal.      Breath sounds: Normal breath sounds.   Abdominal:      General: Abdomen is flat. Bowel sounds are normal.      Palpations: Abdomen is soft. There is no mass.      Tenderness: There is no abdominal tenderness.      Hernia: No hernia is present.   Musculoskeletal:         General: Normal range of motion.      Cervical back: Normal range of motion and neck supple.      Right lower leg: No edema.      Left lower leg: No edema.   Lymphadenopathy:      Upper Body:      Right upper body: No supraclavicular adenopathy.      Left upper body: No supraclavicular adenopathy.   Skin:  General: Skin is warm and dry.      Capillary Refill: Capillary refill takes 2 to 3 seconds.      Findings: Bruising present. No erythema.   Neurological:      General: No focal deficit present.      Mental Status: He is alert and oriented to person, place, and time.      Motor: No weakness.      Gait: Gait normal.   Psychiatric:         Mood and Affect: Mood normal.         Diagnostic Data Reviewed    Liver Biopsy dated 02/23/2006-  Diffuse fatty infiltration with focal deposits of intracytoplasmic iron granules suggestive of hemochromatosis.  No evidence of cirrhosis.      LABORATORY:  CBC  Diff   Lab Results   Component Value Date/Time    WBC 6.9 07/22/2023 09:16 AM    HGB 15.2 07/22/2023 09:16 AM    HCT 43.7 07/22/2023 09:16 AM    PLTCNT 193 07/22/2023 09:16 AM    RBC 4.73 07/22/2023 09:16 AM    MCV 92.3 07/22/2023 09:16 AM    MCHC 34.7 07/22/2023 09:16 AM    MCH 32.1 07/22/2023 09:16 AM    RDW 13.5 07/22/2023 09:16 AM    MPV 9.9 07/22/2023 09:16 AM    Lab Results   Component Value Date/Time    PMNS 63 07/22/2023 09:16 AM    LYMPHOCYTES 21 07/22/2023 09:16 AM    EOSINOPHIL 3 07/22/2023 09:16 AM    MONOCYTES 13 07/22/2023 09:16 AM    BASOPHILS 1 07/22/2023 09:16 AM    BASOPHILS 0.00 07/22/2023 09:16 AM    PMNABS 4.40 07/22/2023 09:16 AM    LYMPHSABS 1.40 07/22/2023 09:16 AM    EOSABS 0.20 07/22/2023 09:16 AM    MONOSABS 0.90 07/22/2023 09:16 AM            Comprehensive Metabolic Profile    Lab Results   Component Value Date    SODIUM 135 (L) 07/22/2023    POTASSIUM 4.3 07/22/2023    CHLORIDE 104 07/22/2023    CO2 27 07/22/2023    ANIONGAP 4 07/22/2023    BUN 28 (H) 07/22/2023    CREATININE 1.24 07/22/2023    ALBUMIN 4.4 07/22/2023    CALCIUM 10.1 07/22/2023    GLUCOSENF 203 (H) 07/22/2023    ALKPHOS 46 07/22/2023    ALT 38 07/22/2023    AST 25 07/22/2023    TOTBILIRUBIN 0.7 07/22/2023    TOTALPROTEIN 7.2 07/22/2023         ESTIMATED GFR   Date Value Ref Range Status   07/22/2023 62 >59 mL/min/1.42m^2 Final       IRON   Date Value Ref Range Status   07/22/2023 120 50 - 212 ug/dL Final     FERRITIN   Date Value Ref Range Status   07/22/2023 47 24 - 336 ng/mL Final     TOTAL IRON BINDING CAPACITY   Date Value Ref Range Status   07/22/2023 335 250 - 450 ug/dL Final     UIBC   Date Value Ref Range Status   07/22/2023 215 130 - 375 ug/dL Final     IRON (TRANSFERRIN) SATURATION   Date  Value Ref Range Status   07/22/2023 36 20 - 50 % Final     VITAMIN B 12   Date Value Ref Range Status   10/14/2021 720 180 - 914 pg/mL Final     04/23/2023  PSA 1.68    IMAGING:    05/13/2023 US Liver  The liver measures 14.3 cm in AP diameter. Normal measures less than or equal to 15.5 cm in AP diameter. The liver again exhibits an overall increase in echogenicity. No focal hepatic mass is identified. The gallbladder is normally distended.  No gallstones are seen within its lumen. No pericholecystic fluid or gallbladder wall thickening is identified. The common hepatic duct measures 4.8 mm in diameter.    IMPRESSION:  No focal hepatic mass is identified. The liver again exhibits an overall increase in echogenicity. No evidence of cholelithiasis is seen.      07/23/22 CT Chest   IMPRESSION:  Clear lungs   Lower esophageal wall thickening raises the suspicion for esophagitis.    07/23/2022 CT Abdomen and Pelvis   Nonspecific fluid distended loops of small bowel in the mid and lower abdomen. Findings could reflect ileus in the setting of infection or early partial small bowel obstruction, though no discrete transition point is identified.   Lower esophageal wall thickening may be correlated for signs and symptoms of esophageal reflux.     07/24/22 KUB- A nonobstructive bowel gas pattern is noted     07/30/2022 KUB NO ACUTE FINDINGS     08/06/2022 X-ray T Spine- MILD DEGENERATIVE DISC DISEASE.           ASSESSMENT:   1.  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  Last therapeutic phlebotomy 1-2 years ago, per patient.     PLAN:  All relative external and internal medical records were reviewed including available H&Ps, progress notes, procedure notes, imaging's, laboratories, and pathology    1. Hereditary Hemochromatosis- PER [employer]  "Heterozygosity for HFE C282Y generally produces an unaffected carrier state with similar risk of iron  overload as the general population. In some heterozygotes, concomitant liver disease may cause abnormal iron studies.   compound heterozygosity with C282Y (C282Y/H63D), generally in combination with other causes of liver disease that contribute to iron overload, such as alcohol, hepatitis C virus (HCV) infection, or metabolic dysfunction-associated steatotic liver disease   heterozygosity (C282Y/H63D) is seen in a few percent of individuals with HH, usually with mild, non-progressive iron overload and with other contributory factors such as liver disease from excess alcohol intake, HCV infection, or metabolic dysfunction-associated steatotic liver disease; such other factors need to be searched for and corrected whenever possible "  The patient's current iron saturation is greater than 45% at 56 % today.  His ferritin though remains within normal range at 33.  No evidence of iron low at overload at this time.  Returned for follow up with CBC, CMP, iron panel, ferritin 6 months     2. RTC 6  months     The patient was given the opportunity to ask questions, and these were answered to their satisfaction. They are welcome to call with any questions or concerns at any point.      On the day of the encounter, I spent a total of  23 minutes on this patient encounter including review of historical information, examination, documentation and post-visit activities.       Evie Lacks APRN, FNP-C 3/6/202510:36

## 2023-07-22 NOTE — Result Encounter Note (Signed)
 Call patient and let him know his ferritin is normal at 47.      Evie Lacks APRN, FNP-C 3/6/202511:49

## 2023-07-23 ENCOUNTER — Other Ambulatory Visit (INDEPENDENT_AMBULATORY_CARE_PROVIDER_SITE_OTHER): Payer: Self-pay | Admitting: Internal Medicine

## 2023-08-02 ENCOUNTER — Ambulatory Visit (INDEPENDENT_AMBULATORY_CARE_PROVIDER_SITE_OTHER): Payer: Self-pay

## 2023-09-08 ENCOUNTER — Ambulatory Visit (INDEPENDENT_AMBULATORY_CARE_PROVIDER_SITE_OTHER): Payer: Self-pay | Admitting: Internal Medicine

## 2023-09-08 ENCOUNTER — Encounter (INDEPENDENT_AMBULATORY_CARE_PROVIDER_SITE_OTHER): Payer: Self-pay | Admitting: Internal Medicine

## 2023-09-08 ENCOUNTER — Ambulatory Visit: Payer: Self-pay | Attending: Internal Medicine | Admitting: Internal Medicine

## 2023-09-08 ENCOUNTER — Other Ambulatory Visit: Payer: Self-pay

## 2023-09-08 VITALS — BP 141/75 | HR 81 | Ht 67.0 in | Wt 208.0 lb

## 2023-09-08 DIAGNOSIS — E1169 Type 2 diabetes mellitus with other specified complication: Secondary | ICD-10-CM | POA: Insufficient documentation

## 2023-09-08 DIAGNOSIS — F331 Major depressive disorder, recurrent, moderate: Secondary | ICD-10-CM | POA: Insufficient documentation

## 2023-09-08 DIAGNOSIS — G8929 Other chronic pain: Secondary | ICD-10-CM | POA: Insufficient documentation

## 2023-09-08 DIAGNOSIS — E1159 Type 2 diabetes mellitus with other circulatory complications: Secondary | ICD-10-CM | POA: Insufficient documentation

## 2023-09-08 DIAGNOSIS — E1142 Type 2 diabetes mellitus with diabetic polyneuropathy: Secondary | ICD-10-CM | POA: Insufficient documentation

## 2023-09-08 DIAGNOSIS — I152 Hypertension secondary to endocrine disorders: Secondary | ICD-10-CM | POA: Insufficient documentation

## 2023-09-08 DIAGNOSIS — R053 Chronic cough: Secondary | ICD-10-CM | POA: Insufficient documentation

## 2023-09-08 DIAGNOSIS — Z0000 Encounter for general adult medical examination without abnormal findings: Secondary | ICD-10-CM | POA: Insufficient documentation

## 2023-09-08 DIAGNOSIS — E782 Mixed hyperlipidemia: Secondary | ICD-10-CM | POA: Insufficient documentation

## 2023-09-08 DIAGNOSIS — R972 Elevated prostate specific antigen [PSA]: Secondary | ICD-10-CM | POA: Insufficient documentation

## 2023-09-08 DIAGNOSIS — R5382 Chronic fatigue, unspecified: Secondary | ICD-10-CM | POA: Insufficient documentation

## 2023-09-08 DIAGNOSIS — M25511 Pain in right shoulder: Secondary | ICD-10-CM | POA: Insufficient documentation

## 2023-09-08 DIAGNOSIS — Z7984 Long term (current) use of oral hypoglycemic drugs: Secondary | ICD-10-CM

## 2023-09-08 DIAGNOSIS — M546 Pain in thoracic spine: Secondary | ICD-10-CM | POA: Insufficient documentation

## 2023-09-08 LAB — MICROALBUMIN/CREATININE RATIO, URINE, RANDOM
CREATININE RANDOM URINE: 119 mg/dL (ref 30–125)
MICROALBUMIN RANDOM URINE: <0.7 mg/dL

## 2023-09-08 LAB — HGA1C (HEMOGLOBIN A1C WITH EST AVG GLUCOSE): HEMOGLOBIN A1C: 7.1 % — ABNORMAL HIGH (ref 4.0–6.0)

## 2023-09-08 MED ORDER — RABEPRAZOLE 20 MG TABLET,DELAYED RELEASE
20.0000 mg | DELAYED_RELEASE_TABLET | Freq: Every day | ORAL | 1 refills | Status: DC
Start: 2023-09-08 — End: 2023-11-23

## 2023-09-08 MED ORDER — TAMSULOSIN 0.4 MG CAPSULE
0.4000 mg | ORAL_CAPSULE | Freq: Every evening | ORAL | 0 refills | Status: DC
Start: 2023-09-08 — End: 2023-12-09

## 2023-09-08 MED ORDER — TRELEGY ELLIPTA 100 MCG-62.5 MCG-25 MCG POWDER FOR INHALATION
1.0000 | DISK | Freq: Every day | RESPIRATORY_TRACT | 1 refills | Status: DC
Start: 2023-09-08 — End: 2023-12-16

## 2023-09-08 NOTE — Patient Instructions (Signed)
 Medicare Preventive Services  Medicare coverage information Recommendation for YOU   Heart Disease and Diabetes   Lipid profile every 5 years or more often if at risk for cardiovascular disease  Lab Results   Component Value Date    CHOLESTEROL 110 01/04/2023    HDLCHOL 48 01/04/2023    LDLCHOL 38 01/04/2023    LDLCHOLDIR 24 09/17/2020    TRIG 121 01/04/2023       Diabetes Screening with Blood Glucose test or Glucose Tolerance Test Yearly for those at risk for diabetes, up to two tests per year for those with prediabetes  Last Glucose: 203     Diabetes Self-Management Training   Initial training ten hours per year, and follow-up training two hours per subsequent year. Optional for those with diabetes    Medical Nutrition Therapy   Three hours of one-on-one counseling in first year, two hours in subsequent years. Optional for those with diabetes, kidney disease   Intensive Behavioral Therapy for Obesity  Face-to-face counseling, first month every week, month 2-6 every other week, month 7-12 every month if continued progress is documented Optional for those with Body Mass Index 30 or higher  Your Body mass index is 32.58 kg/m.   Tobacco Cessation (Quitting) Counseling   Two attempts per year, max 4 sessions per attempt, up to 8 sessions per year Optional for those who use tobacco    Cancer Screening Last Completion Date   Colorectal screening   For anyone age 43 to 70 or any age if high risk:  Screening Colonoscopy every 10 yrs if low risk,  more frequent if higher risk  OR  Cologuard Stool DNA test once every 3 years OR  Fecal Occult Blood Testing yearly OR  Flexible  Sigmoidoscopy  every 5 yr OR  CT Colonography every 5 yrs  --11/25/2022  See below for due date if applicable.   Prostate Cancer Screening  Prostate Specific Antigen blood test based on joint decision making with your provider for ages 17-69  A joint decision between you and your primary care provider   Lung Cancer Screening  Annual low dose computed  tomography (LDCT scan) is recommended for those age 56-80 who smoked 20 pack-years and are current smokers or quit smoking within past 15 years, after counseling by your doctor or nurse clinician about the possible benefits or harms.   See below for due date if applicable.   Vaccinations   Respiratory syncytial virus (RSV)  Age 72 years or older: Based on shared clinical decision-making with your provider.  Pneumococcal Vaccine  Recommended routinely age 40+ with one or two separate vaccines based on your risk. Recommended before age 70 if medical conditions with increased risk  Seasonal Influenza Vaccine  Once every flu season   Hepatitis B Vaccine  3 doses if risk (including anyone with diabetes or liver disease)  Shingles Vaccine  Two doses at age 21 or older  Diphtheria Tetanus Pertussis Vaccine  ONCE as adult, booster every 10 years     Immunization History   Administered Date(s) Administered    Covid-19 Vaccine,Moderna,12 Years+ 06/10/2019, 07/08/2019, 04/10/2020     Shingles vaccine and Diphtheria Tetanus Pertussis vaccines are available at pharmacies or local health department without a prescription.   Other Preventative Screening  Last Completion Date   Glaucoma Screening   Yearly if in high risk group such as diabetes, family history, African American age 17+ or Hispanic American age 40+ See your Eye Care Provider   Hepatitis  C Screening   Recommended  for those born between ages 18-79 years.   See below for due date if applicable.     HIV Testing  Recommended routinely at least ONCE, covered every year for age 35 to 41 regardless of risk, and every year for age over 77 who ask for the test or higher risk. Yearly or up to 3 times in pregnancy    See below for due date if applicable.  Bone Densitometry   Screening is recommended for Men ages 65 and above with one or more risk factor: androgen deprivation therapy for prostate cancer, hypogonadism, frailty, primary hyperparathyroidism, hyperthyroidism  For  men diagnosed with osteoporosis, follow up is recommended every years or a frequency recommended by your provider     See below for due date if applicable.   Abdominal Aortic Aneurysm Screening Ultrasound   Once with a family history of abdominal aortic aneurysms OR a male between 55-75 and have smoked at least 100 cigarettes in your lifetime.     See below for due date if applicable.       Your Personalized Schedule for Preventive Tests     Health Maintenance: Pending and Last Completed         Date Due Completion Date    Diabetic A1C 07/07/2023 01/04/2023    Diabetic Kidney Health Microalb/Cr Ratio 01/04/2024 01/04/2023    Influenza Vaccine (Season Ended) 01/17/2024 ---    Diabetic Retinal Exam 04/16/2024 ---    Diabetic Kidney Health eGFR 07/21/2024 07/22/2023    Medicare Annual Wellness Visit 09/07/2024 09/08/2023    Colonoscopy 11/24/2032 11/25/2022                For Information on Advanced Directives for Health Care:  [place]:  [email or web address]  [place], [place], [place], [place] General Information: [email or web address]

## 2023-09-08 NOTE — Progress Notes (Signed)
 [facility], [facility]  [address]  [name] [address]  Operated by [facility]     Name: Steven Henderson MRN:  [id number]   Date: 09/08/2023 Age: 73 y.o.       Chief Complaint:    Chief Complaint   Patient presents with    Medicare Annual       HPI:  Steven Henderson is a 73 y.o. male who presents to the office today via follow-up of their diabetic control. Patient is having some minor issues as described below which I have addressed in total with the patient.    Past Medical History:  Past Medical History:   Diagnosis Date    Anxiety state     BPH (benign prostatic hyperplasia)     Depression, acute     Diabetes mellitus, type 2     Elevated PSA     Generalized OA     Hemochromatosis     Hyperlipidemia     Hypertension     Hypomagnesemia     Insomnia     Syncope     Testicular hypofunction     Vitamin D deficiency          Past Surgical History:   Procedure Laterality Date    CYSTOSCOPY  06/23/2017    FINGER SURGERY Left     RING FINGER    HAND SURGERY Right     HX APPENDECTOMY        Current Outpatient Medications   Medication Sig    amLODIPine  (NORVASC ) 10 mg Oral Tablet Take 1 Tablet (10 mg total) by mouth Once a day    aspirin  (ECOTRIN) 81 mg Oral Tablet, Delayed Release (E.C.) Take 1 Tablet (81 mg total) by mouth Daily    finasteride  (PROSCAR ) 5 mg Oral Tablet Take 1 Tablet (5 mg total) by mouth Daily    fluticasone-umeclidin-vilanter (TRELEGY ELLIPTA ) 100-62.5-25 mcg Inhalation Disk with Device Take 1 Inhalation by inhalation Daily    glimepiride  (AMARYL ) 4 mg Oral Tablet Take 1 Tablet (4 mg total) by mouth Twice daily    hydroCHLOROthiazide  (HYDRODIURIL ) 25 mg Oral Tablet Take 1 Tablet (25 mg total) by mouth Once a day    meloxicam (MOBIC) 15 mg Oral Tablet TAKE (1) TABLET DAILY WITH FOOD.    MetFORMIN  (GLUCOPHAGE ) 1,000 mg Oral Tablet Take 1 Tablet (1,000 mg total) by mouth Twice daily with food    RABEprazole  (ACIPHEX ) 20 mg Oral Tablet, Delayed Release  (E.C.) Take 1 Tablet (20 mg total) by mouth Daily    tamsulosin  (FLOMAX ) 0.4 mg Oral Capsule Take 1 Capsule (0.4 mg total) by mouth Every night    telmisartan  (MICARDIS ) 80 mg Oral Tablet Take 1 Tablet (80 mg total) by mouth Once a day     No Known Allergies    Family History:  Family Medical History:       Problem Relation (Age of Onset)    Liver Cancer Father    Stroke Mother              Social History:   Social History     Tobacco Use   Smoking Status Never    Passive exposure: Never   Smokeless Tobacco Never     Social History     Substance and Sexual Activity   Alcohol Use Not Currently     Social History     Occupational History    Not on file       Review  of Systems:  Review of systems as discussed in HPI      Problem List:  Patient Active Problem List   Diagnosis    Anxiety state    BPH (benign prostatic hyperplasia)    Depression, acute    Diabetes mellitus, type 2    Elevated PSA    Generalized OA    Hypomagnesemia    Insomnia    Major depression, recurrent    Chronic bilateral thoracic back pain    Hypertension associated with type 2 diabetes mellitus (CMS HCC)  (CMS HCC)    Fatigue    Hereditary hemochromatosis (CMS HCC)    Dupuytren's contracture of both hands    Ileus (CMS HCC)    Chronic cough    Partial small bowel obstruction (CMS HCC)    Dehydration    Colon cancer screening    Scapulothoracic syndrome    Chronic right shoulder pain    DM type 2 with diabetic mixed hyperlipidemia (CMS HCC)  (CMS HCC)    Syncope    Shoulder tendonitis, right    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   Constitutional:       General: He is not in acute distress.     Appearance: Normal appearance.   HENT:      Head: Normocephalic.      Right Ear:  Tympanic membrane normal.      Left Ear: Tympanic membrane normal.      Nose: Nose normal.      Mouth/Throat:      Mouth: Mucous membranes are moist.   Eyes:      General: No scleral icterus.     Extraocular Movements: Extraocular movements intact.      Conjunctiva/sclera: Conjunctivae normal.      Pupils: Pupils are equal, round, and reactive to light.   Neck:      Vascular: No carotid bruit.   Cardiovascular:      Rate and Rhythm: Normal rate and regular rhythm.      Pulses: Normal pulses.           Dorsalis pedis pulses are 2+ on the right side and 2+ on the left side.        Posterior tibial pulses are 2+ on the right side and 2+ on the left side.      Heart sounds: Normal heart sounds.   Pulmonary:      Effort: Pulmonary effort is normal.      Breath sounds: Normal breath sounds. No wheezing, rhonchi or rales.   Abdominal:      General: Abdomen is flat. Bowel sounds are normal.      Tenderness: There is no abdominal tenderness. There is no guarding.   Musculoskeletal:         General: No swelling. Normal range of motion.      Cervical back: Normal range of motion. No tenderness.      Right lower leg: No edema.      Left lower leg: No edema.   Skin:     General: Skin is warm.      Capillary Refill: Capillary refill  takes less than 2 seconds.      Findings: Ecchymosis present.   Neurological:      General: No focal deficit present.      Mental Status: He is alert and oriented to person, place, and time.      Cranial Nerves: No cranial nerve deficit.            Health Maintenance:  Health Maintenance   Topic Date Due    Diabetic A1C  07/07/2023    Diabetic Kidney Health Microalb/Cr Ratio  01/04/2024    Influenza Vaccine (Season Ended) 01/17/2024    Diabetic Retinal Exam  04/16/2024    Diabetic Kidney Health eGFR  07/21/2024    Medicare Annual Wellness Visit  09/07/2024    Colonoscopy  11/24/2032    Pneumococcal Vaccination, Age 4+  Completed    Adult Tdap-Td  Discontinued    Hepatitis C screening  Discontinued     Shingles Vaccine  Discontinued    Covid-19 Vaccine  Discontinued    Prostate Cancer Screening  Discontinued        Assessment/Plan:    I have reviewed the most recent labs with the patient and all questions answered to patients satisfaction.       ICD-10-CM    1. Encounter for Medicare annual wellness exam  Z00.00 Exam Performed      2. Hypertension associated with type 2 diabetes mellitus (CMS HCC)  (CMS HCC)  E11.59 Blood Pressure today is stable and will continue current treatment plans     HGA1C (HEMOGLOBIN A1C WITH EST AVG GLUCOSE)    I15.2 MICROALBUMIN/CREATININE RATIO, URINE, RANDOM      3. DM type 2 with diabetic mixed hyperlipidemia (CMS HCC)  (CMS HCC)  E11.69 Labs ordered here and on this visit will be be addressed by me. I will contact patient and address therapy options once completed.     HGA1C (HEMOGLOBIN A1C WITH EST AVG GLUCOSE)    E78.2 MICROALBUMIN/CREATININE RATIO, URINE, RANDOM      4. Type 2 diabetes mellitus with diabetic polyneuropathy, without long-term current use of insulin  (CMS HCC)  E11.42 Labs ordered here and on this visit will be be addressed by me. I will contact patient and address therapy options once completed.       5. Hereditary hemochromatosis (CMS HCC)  E83.110 I have reviewed the most recent labs with the patient and all questions answered to patients satisfaction.       6. Chronic right shoulder pain  M25.511 XR SHOULDER RIGHT    G89.29       7. Chronic bilateral thoracic back pain  M54.6 XR THORACIC SPINE 3 VIEWS    G89.29       8. Elevated PSA  R97.20 Labs ordered here and on this visit will be be addressed by me. I will contact patient and address therapy options once completed.       9. 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        10. Chronic fatigue  R53.82 Condition stable will continue current therapy.        11. Chronic cough  R05.3 fluticasone-umeclidin-vilanter (TRELEGY ELLIPTA )  100-62.5-25 mcg Inhalation Disk with Device     RABEprazole  (ACIPHEX ) 20 mg Oral Tablet, Delayed Release (E.C.)         Orders Placed This Encounter    XR THORACIC SPINE 3 VIEWS    XR SHOULDER RIGHT    HGA1C (  HEMOGLOBIN A1C WITH EST AVG GLUCOSE)    MICROALBUMIN/CREATININE RATIO, URINE, RANDOM    tamsulosin  (FLOMAX ) 0.4 mg Oral Capsule    fluticasone-umeclidin-vilanter (TRELEGY ELLIPTA ) 100-62.5-25 mcg Inhalation Disk with Device    RABEprazole  (ACIPHEX ) 20 mg Oral Tablet, Delayed Release (E.C.)     Medications Discontinued During This Encounter   Medication Reason    tamsulosin  (FLOMAX ) 0.4 mg Oral Capsule Reorder          Current diabetes medications:   Active Diabetes Meds   Medication Sig    glimepiride  (AMARYL ) 4 mg Oral Tablet Take 1 Tablet (4 mg total) by mouth Twice daily    MetFORMIN  (GLUCOPHAGE ) 1,000 mg Oral Tablet Take 1 Tablet (1,000 mg total) by mouth Twice daily with food         Follow up:  Return in about 6 weeks (around 10/20/2023).    This note was partially created using voice recognition software and is inherently subject to errors including those of syntax and "sound alike " substitutions which may escape proof reading.  In such instances, original meaning may be extrapolated by contextual derivation.    Celeste Cola, DO  09/08/2023, 09:03      [facility], [facility]  [address]  [name] [facility] [address]  Operated by [facility]  Medicare Annual Wellness Visit    Name: Cardae Sapp MRN:  [id number]   Date: 09/08/2023 Age: 73 y.o.       SUBJECTIVE:   Steven Henderson is a 73 y.o. male for presenting for Medicare Wellness exam.   I have reviewed and reconciled the medication list with the patient today.        09/08/2023     8:38 AM 09/07/2022     8:19 AM   Comprehensive Health Assessment-Adult   Do you wish to complete this form? Yes Yes   During the past 4 weeks, how would you rate your health in general? Good Good   During the past 4 weeks, how much  difficulty have you had doing your usual activities inside and outside your home because of medical or emotional problems? Some difficulty A little bit of difficulty   During the past 4 weeks, was someone available to help you if you needed and wanted help? Yes, as much as I wanted Yes, quite a bit   In the past year, how many times have you gone to the emergency department or been admitted to a hospital for a health problem? None 2-4 times   Are you generally satisfied with your sleep? No No   Do you have enough money to buy things you need in everyday life, such as food, clothing, medicines, and housing? Yes, always Yes, always   Can you get to places beyond walking distance without help?  (For example, can you drive your own car or travel alone on buses)? Yes Yes   Do you fasten your seatbelt when you are in a car? Yes, usually Yes, usually   Do you exercise 20 minutes 3 or more days per week (such as walking, dancing, biking, mowing grass, swimming)? Yes, most of the time Yes, some of the time   How often do you eat food that is healthy (fruits, vegetables, lean meats) instead of unhealthy (sweets, fast food, junk food, fatty foods)? Some of the time Some of the time   Have your parents, brothers or sisters had any of the following problems before the age of 45? (check all  that apply) Cancer    How often do you have trouble taking medicines the eay you are told to take them? I always take them as prescribed I always take them as prescribed   Do you need any help communicating with your doctors and nurses because of vision or hearing problems? No No   During the past 12 months, have you experienced confusion or memory loss that is happening more often or is getting worse? Yes No   Do you have one person you think of as your personal doctor (primary care provider or family doctor)? Yes Yes   If you are seeing a Primary Care Provider (PCP) or family doctor. please list their name Dr. Darel Ebbs Dr. Darel Ebbs   Are  you now also seeing any specialist physician(s) (such as eye doctor, foot doctor, skin doctor)? No Yes   If you are seeing a specialist for anything such as foot, eye, skin, etc.  please list their name(s)  Oncololgy--Eye doctor--   How confident are you that you can control or manage most of your health problems? Very confident Somewhat confident       I have reviewed and updated as appropriate the past medical, family and social history. 09/08/2023 as summarized below:  Past Medical History:   Diagnosis Date    Anxiety state     BPH (benign prostatic hyperplasia)     Depression, acute     Diabetes mellitus, type 2     Elevated PSA     Generalized OA     Hemochromatosis     Hyperlipidemia     Hypertension     Hypomagnesemia     Insomnia     Syncope     Testicular hypofunction     Vitamin D deficiency      Past Surgical History:   Procedure Laterality Date    Cystoscopy  06/23/2017    Finger surgery Left     Hand surgery Right     Hx appendectomy       Current Outpatient Medications   Medication Sig    amLODIPine  (NORVASC ) 10 mg Oral Tablet Take 1 Tablet (10 mg total) by mouth Once a day    aspirin  (ECOTRIN) 81 mg Oral Tablet, Delayed Release (E.C.) Take 1 Tablet (81 mg total) by mouth Daily    finasteride  (PROSCAR ) 5 mg Oral Tablet Take 1 Tablet (5 mg total) by mouth Daily    fluticasone-umeclidin-vilanter (TRELEGY ELLIPTA ) 100-62.5-25 mcg Inhalation Disk with Device Take 1 Inhalation by inhalation Daily    glimepiride  (AMARYL ) 4 mg Oral Tablet Take 1 Tablet (4 mg total) by mouth Twice daily    hydroCHLOROthiazide  (HYDRODIURIL ) 25 mg Oral Tablet Take 1 Tablet (25 mg total) by mouth Once a day    meloxicam (MOBIC) 15 mg Oral Tablet TAKE (1) TABLET DAILY WITH FOOD.    MetFORMIN  (GLUCOPHAGE ) 1,000 mg Oral Tablet Take 1 Tablet (1,000 mg total) by mouth Twice daily with food    RABEprazole  (ACIPHEX ) 20 mg Oral Tablet, Delayed Release (E.C.) Take 1 Tablet (20 mg total) by mouth Daily    tamsulosin  (FLOMAX ) 0.4 mg Oral  Capsule Take 1 Capsule (0.4 mg total) by mouth Every night    telmisartan  (MICARDIS ) 80 mg Oral Tablet Take 1 Tablet (80 mg total) by mouth Once a day     Family Medical History:       Problem Relation (Age of Onset)    Liver Cancer Father    Stroke Mother  Social History     Socioeconomic History    Marital status: Married   Tobacco Use    Smoking status: Never     Passive exposure: Never    Smokeless tobacco: Never   Vaping Use    Vaping status: Never Used   Substance and Sexual Activity    Alcohol use: Not Currently    Drug use: Never    Sexual activity: Not Currently     Social Determinants of Health     Financial Resource Strain: Low Risk  (05/26/2022)    Financial Resource Strain     SDOH Financial: No   Transportation Needs: Low Risk  (02/11/2023)    Transportation Needs     SDOH Transportation: No   Social Connections: Low Risk  (02/11/2023)    Social Connections     SDOH Social Isolation: 5 or more times a week   Intimate Partner Violence: Low Risk  (05/26/2022)    Intimate Partner Violence     SDOH Domestic Violence: No   Housing Stability: Low Risk  (05/26/2022)    Housing Stability     SDOH Housing Situation: I have housing.     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   Care Team       PCP       Name Type Specialty Phone Number    Celeste Cola, DO Physician INTER[facility]INE [phone]              Care Team       Name Type Specialty Phone Number    Netty Barba, RN Registered Nurse Not available Not available                      Health Maintenance   Topic Date Due    Diabetic A1C  07/07/2023    Diabetic Kidney Health Microalb/Cr Ratio  01/04/2024    Influenza Vaccine (Season Ended) 01/17/2024    Diabetic Retinal Exam  04/16/2024    Diabetic Kidney Health eGFR  07/21/2024     Medicare Annual Wellness Visit  09/07/2024    Colonoscopy  11/24/2032    Pneumococcal Vaccination, Age 15+  Completed    Adult Tdap-Td  Discontinued    Hepatitis C screening  Discontinued    Shingles Vaccine  Discontinued    Covid-19 Vaccine  Discontinued    Prostate Cancer Screening  Discontinued     Medicare Wellness Assessment   Medicare initial or wellness physical in the last year?: No  Advance Directives   Does patient have a living will or MPOA: No           Advance directive information given to the patient today?: Yes      Activities of Daily Living   Do you need help with dressing, bathing, or walking?: No   Do you need help with shopping, housekeeping, medications, or finances?: No   Do you have rugs in hallways, broken steps, or poor lighting?: No   Do you have grab bars in your bathroom, non-slip strips in your tub, and hand rails on your stairs?: No   Cognitive Function Screen (1=Yes, 0=No)   What is you age?:  Correct   What is the time to the nearest hour?: Correct   What is the year?: Correct   What is the name of this clinic?: Correct   Can the patient recognize two persons (the doctor, the nurse, home help, etc.)?: Correct   What is the date of your birth? (day and month sufficient) : Correct   In what year did World War II end?: Incorrect   Who is the current president of the [place] ?: Correct   Count from 20 down to 1?: Correct   What address did I give you earlier?: Correct   Total Score: 9       Fall Risk Screen   Do you feel unsteady when standing or walking?: No  Do you worry about falling?: No  Have you fallen in the past year?: No   Depression Screen     Little interest or pleasure in doing things.: Not at all  Feeling down, depressed, or hopeless: Nearly every day  PHQ 2 Total: 3  Trouble falling or staying asleep, or sleeping too much.: Several Days  Feeling tired or having little energy: Several Days  Poor appetite or overeating: More than half the days  Feeling bad about yourself/  that you are a failure in the past 2 weeks?: More than half the days  Trouble concentrating on things in the past 2 weeks?: Not at all  Moving/Speaking slowly or being fidgety or restless  in the past 2 weeks?: Not at all  Thoughts that you would be better off DEAD, or of hurting yourself in some way.: Not at all  PHQ 9 Total: 9     Pain Score   Pain Score:   6 (shoulder)    Substance Use-Abuse Screening     Tobacco Use     In Past 12 MONTHS, how often have you used any tobacco product (for example, cigarettes, e-cigarettes, cigars, pipes, or smokeless tobacco)?: Never     Alcohol use     In the PAST 12 MONTHS, how often have you had 5 (men)/4 (women) or more drinks containing alcohol in one day?: Never     Prescription Drug Use     In the PAST 12 months, how often have you used any prescription medications just for the feeling, more than prescribed, or that were not prescribed for you? Prescriptions may include: opioids, benzodiazepines, medications for ADHD: Never           Illicit Drug Use   In the PAST 12 MONTHS, how often have you used any drugs, including marijuana, cocaine or crack, heroin, methamphetamine, hallucinogens, ecstasy/MDMA?: Never            Urine Incontinence Screen   Urinary Incontinence Screen  Do you ever leak urine when you don't want to?: YES       OBJECTIVE:   BP (!) 141/75 (Site: Left Arm, Patient Position: Sitting, Cuff Size: Adult)   Pulse 81   Ht 1.702 m (5\' 7" )   Wt 94.3 kg (208 lb)   SpO2 99%   BMI 32.58 kg/m        Other appropriate exam:    Health Maintenance Due   Topic Date Due    Diabetic A1C  07/07/2023      ASSESSMENT & PLAN:  Problem List Items Addressed This Visit          Cardiovascular System    Hypertension associated with type 2 diabetes mellitus (CMS HCC)  (CMS HCC) (Chronic)  Relevant Orders    HGA1C (HEMOGLOBIN A1C WITH EST AVG GLUCOSE)    MICROALBUMIN/CREATININE RATIO, URINE, RANDOM    DM type 2 with diabetic mixed hyperlipidemia (CMS HCC)  (CMS HCC) (Chronic)     Relevant Orders    HGA1C (HEMOGLOBIN A1C WITH EST AVG GLUCOSE)    MICROALBUMIN/CREATININE RATIO, URINE, RANDOM       Respiratory    Chronic cough    Relevant Medications    fluticasone-umeclidin-vilanter (TRELEGY ELLIPTA ) 100-62.5-25 mcg Inhalation Disk with Device    RABEprazole  (ACIPHEX ) 20 mg Oral Tablet, Delayed Release (E.C.)       Endocrine    Diabetes mellitus, type 2 (Chronic)    Hereditary hemochromatosis (CMS HCC) (Chronic)       Musculoskeletal    Chronic bilateral thoracic back pain (Chronic)    Relevant Orders    XR THORACIC SPINE 3 VIEWS    Chronic right shoulder pain    Relevant Orders    XR SHOULDER RIGHT       Urology    Elevated PSA (Chronic)       Psychiatric    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        Orders Placed This Encounter    XR THORACIC SPINE 3 VIEWS    XR SHOULDER RIGHT    HGA1C (HEMOGLOBIN A1C WITH EST AVG GLUCOSE)    MICROALBUMIN/CREATININE RATIO, URINE, RANDOM    tamsulosin  (FLOMAX ) 0.4 mg Oral Capsule    fluticasone-umeclidin-vilanter (TRELEGY ELLIPTA ) 100-62.5-25 mcg Inhalation Disk with Device    RABEprazole  (ACIPHEX ) 20 mg Oral Tablet, Delayed Release (E.C.)          The patient has been educated about risk factors and recommended preventive care. Written Prevention Plan completed/ updated and given to patient (see After Visit Summary).    Return in about 6 weeks (around 10/20/2023).    Celeste Cola, DO  09/08/2023 09:19

## 2023-09-10 ENCOUNTER — Ambulatory Visit
Admission: RE | Admit: 2023-09-10 | Discharge: 2023-09-10 | Disposition: A | Discharge status: Home / Self Care | Source: Ambulatory Visit | Attending: Internal Medicine | Admitting: Internal Medicine

## 2023-09-10 ENCOUNTER — Other Ambulatory Visit: Payer: Self-pay

## 2023-09-10 ENCOUNTER — Other Ambulatory Visit (INDEPENDENT_AMBULATORY_CARE_PROVIDER_SITE_OTHER): Payer: Self-pay | Admitting: Internal Medicine

## 2023-09-10 DIAGNOSIS — M25511 Pain in right shoulder: Secondary | ICD-10-CM

## 2023-09-10 DIAGNOSIS — G8929 Other chronic pain: Secondary | ICD-10-CM | POA: Insufficient documentation

## 2023-09-10 DIAGNOSIS — Z0000 Encounter for general adult medical examination without abnormal findings: Secondary | ICD-10-CM

## 2023-09-10 DIAGNOSIS — E1142 Type 2 diabetes mellitus with diabetic polyneuropathy: Secondary | ICD-10-CM

## 2023-09-10 DIAGNOSIS — M546 Pain in thoracic spine: Secondary | ICD-10-CM | POA: Insufficient documentation

## 2023-09-10 DIAGNOSIS — F331 Major depressive disorder, recurrent, moderate: Secondary | ICD-10-CM

## 2023-09-10 DIAGNOSIS — R053 Chronic cough: Secondary | ICD-10-CM

## 2023-09-10 DIAGNOSIS — E1169 Type 2 diabetes mellitus with other specified complication: Secondary | ICD-10-CM

## 2023-09-10 DIAGNOSIS — R972 Elevated prostate specific antigen [PSA]: Secondary | ICD-10-CM

## 2023-09-10 DIAGNOSIS — E1159 Type 2 diabetes mellitus with other circulatory complications: Secondary | ICD-10-CM

## 2023-09-10 DIAGNOSIS — R5382 Chronic fatigue, unspecified: Secondary | ICD-10-CM

## 2023-09-13 ENCOUNTER — Other Ambulatory Visit (INDEPENDENT_AMBULATORY_CARE_PROVIDER_SITE_OTHER): Payer: Self-pay | Admitting: Internal Medicine

## 2023-09-13 DIAGNOSIS — M546 Pain in thoracic spine: Secondary | ICD-10-CM

## 2023-09-14 ENCOUNTER — Other Ambulatory Visit (INDEPENDENT_AMBULATORY_CARE_PROVIDER_SITE_OTHER): Payer: Self-pay | Admitting: Internal Medicine

## 2023-09-14 ENCOUNTER — Telehealth (INDEPENDENT_AMBULATORY_CARE_PROVIDER_SITE_OTHER): Payer: Self-pay | Admitting: Internal Medicine

## 2023-09-14 DIAGNOSIS — E1159 Type 2 diabetes mellitus with other circulatory complications: Secondary | ICD-10-CM

## 2023-09-14 NOTE — Telephone Encounter (Signed)
 Pt notified [facility] on Tuesday may 06 at 5:45 pm. Referral mailed

## 2023-09-21 ENCOUNTER — Other Ambulatory Visit: Payer: Self-pay

## 2023-09-21 ENCOUNTER — Ambulatory Visit
Admission: RE | Admit: 2023-09-21 | Discharge: 2023-09-21 | Disposition: A | Discharge status: Home / Self Care | Payer: Self-pay | Source: Ambulatory Visit | Attending: Internal Medicine | Admitting: Internal Medicine

## 2023-09-21 DIAGNOSIS — M546 Pain in thoracic spine: Secondary | ICD-10-CM | POA: Insufficient documentation

## 2023-09-21 DIAGNOSIS — M5134 Other intervertebral disc degeneration, thoracic region: Secondary | ICD-10-CM

## 2023-09-22 ENCOUNTER — Ambulatory Visit (INDEPENDENT_AMBULATORY_CARE_PROVIDER_SITE_OTHER): Payer: Self-pay | Admitting: Internal Medicine

## 2023-10-04 ENCOUNTER — Other Ambulatory Visit (INDEPENDENT_AMBULATORY_CARE_PROVIDER_SITE_OTHER): Payer: Self-pay | Admitting: Internal Medicine

## 2023-10-15 ENCOUNTER — Ambulatory Visit (INDEPENDENT_AMBULATORY_CARE_PROVIDER_SITE_OTHER): Payer: Self-pay | Admitting: Internal Medicine

## 2023-10-28 ENCOUNTER — Other Ambulatory Visit (INDEPENDENT_AMBULATORY_CARE_PROVIDER_SITE_OTHER): Payer: Self-pay | Admitting: Internal Medicine

## 2023-10-28 DIAGNOSIS — I152 Hypertension secondary to endocrine disorders: Secondary | ICD-10-CM

## 2023-10-29 ENCOUNTER — Ambulatory Visit: Payer: Self-pay | Attending: Internal Medicine | Admitting: Internal Medicine

## 2023-10-29 ENCOUNTER — Other Ambulatory Visit: Payer: Self-pay

## 2023-10-29 ENCOUNTER — Encounter (INDEPENDENT_AMBULATORY_CARE_PROVIDER_SITE_OTHER): Payer: Self-pay | Admitting: Internal Medicine

## 2023-10-29 VITALS — BP 128/80 | HR 74 | Ht 67.0 in | Wt 213.0 lb

## 2023-10-29 DIAGNOSIS — Z7984 Long term (current) use of oral hypoglycemic drugs: Secondary | ICD-10-CM

## 2023-10-29 DIAGNOSIS — K56600 Partial intestinal obstruction, unspecified as to cause: Secondary | ICD-10-CM | POA: Insufficient documentation

## 2023-10-29 DIAGNOSIS — G8929 Other chronic pain: Secondary | ICD-10-CM | POA: Insufficient documentation

## 2023-10-29 DIAGNOSIS — E782 Mixed hyperlipidemia: Secondary | ICD-10-CM | POA: Insufficient documentation

## 2023-10-29 DIAGNOSIS — R112 Nausea with vomiting, unspecified: Secondary | ICD-10-CM | POA: Insufficient documentation

## 2023-10-29 DIAGNOSIS — M546 Pain in thoracic spine: Secondary | ICD-10-CM | POA: Insufficient documentation

## 2023-10-29 DIAGNOSIS — E1159 Type 2 diabetes mellitus with other circulatory complications: Secondary | ICD-10-CM | POA: Insufficient documentation

## 2023-10-29 DIAGNOSIS — I152 Hypertension secondary to endocrine disorders: Secondary | ICD-10-CM | POA: Insufficient documentation

## 2023-10-29 DIAGNOSIS — E1169 Type 2 diabetes mellitus with other specified complication: Secondary | ICD-10-CM | POA: Insufficient documentation

## 2023-10-29 DIAGNOSIS — M778 Other enthesopathies, not elsewhere classified: Secondary | ICD-10-CM | POA: Insufficient documentation

## 2023-10-29 DIAGNOSIS — M25511 Pain in right shoulder: Secondary | ICD-10-CM

## 2023-10-29 MED ORDER — TRIAMCINOLONE ACETONIDE 40 MG/ML SUSPENSION FOR INJECTION
40.0000 mg | INTRAMUSCULAR | Status: AC
Start: 2023-10-29 — End: 2023-10-29
  Administered 2023-10-29: 40 mg via INTRA_ARTICULAR

## 2023-10-29 MED ORDER — ONDANSETRON 8 MG DISINTEGRATING TABLET
8.0000 mg | ORAL_TABLET | Freq: Three times a day (TID) | ORAL | 1 refills | Status: AC | PRN
Start: 2023-10-29 — End: ?

## 2023-10-29 MED ORDER — LIDOCAINE (PF) 10 MG/ML (1 %) INJECTION SOLUTION
2.0000 mL | Freq: Once | INTRAMUSCULAR | Status: AC
Start: 2023-10-29 — End: 2023-10-29
  Administered 2023-10-29: 40 mg via INTRAMUSCULAR

## 2023-10-29 NOTE — Progress Notes (Addendum)
 [facility], [facility]  [address]  [name] [address]  Operated by [facility]     Name: Andres Vest MRN:  [id number]   Date: 10/29/2023 Age: 73 y.o.       Chief Complaint:    Chief Complaint   Patient presents with    Follow-up After Testing     MRI of the back.         HPI:  Steven Henderson is a 73 y.o. male who presents to the office today for follow-up.     History of Present Illness    The patient presents to the clinic today with persistent right shoulder pain. He reports that the pain has been debilitating, significantly impacting his daily activities. The patient notes that he is unable to raise his right arm as easily as the left, particularly when performing tasks such as washing his back. He describes the pain as being located in the area around the scapula on the right side. The patient's symptoms have been ongoing for some time, with a recent period of improvement over the past couple of weeks. However, he still experiences significant limitations in range of motion and functionality. Previous treatments and evaluations have been attempted, including imaging studies. The patient has been evaluated for potential arthritis as a cause of his symptoms, but this has been ruled out. The patient's medical history is significant for chronic shoulder issues, though the exact nature of the underlying condition remains unclear. The patient's current symptoms are primarily focused on the right shoulder, with some radiation of pain into the surrounding areas. He reports difficulty with overhead activities and reaching behind his back. The pain is exacerbated by certain movements and positions, particularly when the arm is raised against resistance.           Past Medical History:  Past Medical History:   Diagnosis Date    Anxiety state     BPH (benign prostatic hyperplasia)     Depression, acute     Diabetes mellitus, type 2     Elevated PSA     Generalized OA      Hemochromatosis     Hyperlipidemia     Hypertension     Hypomagnesemia     Insomnia     Syncope     Testicular hypofunction     Vitamin D deficiency          Past Surgical History:   Procedure Laterality Date    CYSTOSCOPY  06/23/2017    FINGER SURGERY Left     RING FINGER    HAND SURGERY Right     HX APPENDECTOMY        Current Outpatient Medications   Medication Sig    amLODIPine  (NORVASC ) 10 mg Oral Tablet Take 1 Tablet (10 mg total) by mouth Once a day    aspirin  (ECOTRIN) 81 mg Oral Tablet, Delayed Release (E.C.) Take 1 Tablet (81 mg total) by mouth Daily    finasteride  (PROSCAR ) 5 mg Oral Tablet Take 1 Tablet (5 mg total) by mouth Daily    fluticasone-umeclidin-vilanter (TRELEGY ELLIPTA ) 100-62.5-25 mcg Inhalation Disk with Device Take 1 Inhalation by inhalation Daily    glimepiride  (AMARYL ) 4 mg Oral Tablet Take 1 Tablet (4 mg total) by mouth Twice daily    hydroCHLOROthiazide  (HYDRODIURIL ) 25 mg Oral Tablet TAKE (1) TABLET DAILY    meloxicam (MOBIC) 15 mg Oral Tablet TAKE (1) TABLET DAILY WITH FOOD.    MetFORMIN  (GLUCOPHAGE ) 1,000  mg Oral Tablet TAKE (1) TABLET TWICE A DAY WITH FOOD    ondansetron  (ZOFRAN  ODT) 8 mg Oral Tablet, Rapid Dissolve Place 1 Tablet (8 mg total) under the tongue Every 8 hours as needed for Nausea/Vomiting    RABEprazole  (ACIPHEX ) 20 mg Oral Tablet, Delayed Release (E.C.) Take 1 Tablet (20 mg total) by mouth Daily    tamsulosin  (FLOMAX ) 0.4 mg Oral Capsule Take 1 Capsule (0.4 mg total) by mouth Every night    telmisartan  (MICARDIS ) 80 mg Oral Tablet TAKE (1) TABLET DAILY     Allergies[1]    Family History:  Family Medical History:       Problem Relation (Age of Onset)    Liver Cancer Father    Stroke Mother              Social History:   Tobacco Use History[2]  Social History     Substance and Sexual Activity   Alcohol Use Not Currently     Social History     Occupational History    Not on file       Review of Systems:  Review of systems as discussed in HPI    Problem List:  Problem  List[3]    Physical Examination:  BP 128/80 (Site: Left Arm, Patient Position: Sitting, Cuff Size: Adult)   Pulse 74   Ht 1.702 m (5' 7)   Wt 96.6 kg (213 lb)   SpO2 98%   BMI 33.36 kg/m       Physical Exam  Vitals and nursing note reviewed.   Constitutional:       General: He is not in acute distress.     Appearance: Normal appearance.   HENT:      Head: Normocephalic.      Right Ear: Tympanic membrane normal.      Left Ear: Tympanic membrane normal.      Nose: Nose normal.      Mouth/Throat:      Mouth: Mucous membranes are moist.   Eyes:      General: No scleral icterus.     Extraocular Movements: Extraocular movements intact.      Conjunctiva/sclera: Conjunctivae normal.      Pupils: Pupils are equal, round, and reactive to light.   Neck:      Vascular: No carotid bruit.   Cardiovascular:      Rate and Rhythm: Normal rate and regular rhythm.      Pulses: Normal pulses.           Dorsalis pedis pulses are 2+ on the right side and 2+ on the left side.        Posterior tibial pulses are 2+ on the right side and 2+ on the left side.      Heart sounds: Normal heart sounds.   Pulmonary:      Effort: Pulmonary effort is normal.      Breath sounds: Normal breath sounds. No wheezing, rhonchi or rales.   Abdominal:      General: Abdomen is flat. Bowel sounds are normal.      Tenderness: There is no abdominal tenderness. There is no guarding.   Musculoskeletal:         General: No swelling. Normal range of motion.      Cervical back: Normal range of motion. No tenderness.      Right lower leg: No edema.      Left lower leg: No edema.   Skin:     General: Skin is  warm.      Capillary Refill: Capillary refill takes less than 2 seconds.      Findings: Ecchymosis present.   Neurological:      General: No focal deficit present.      Mental Status: He is alert and oriented to person, place, and time.      Cranial Nerves: No cranial nerve deficit.              Health Maintenance:  Health Maintenance   Topic Date Due     Influenza Vaccine (Season Ended) 01/17/2024    Diabetic A1C  03/09/2024    Diabetic Retinal Exam  04/16/2024    Diabetic Kidney Health eGFR  07/21/2024    Medicare Annual Wellness Visit  09/07/2024    Diabetic Kidney Health Microalb/Cr Ratio  09/07/2024    Colonoscopy  11/24/2032    Pneumococcal Vaccination, Age 22+  Completed    Adult Tdap-Td  Discontinued    Hepatitis C screening  Discontinued    Shingles Vaccine  Discontinued    Covid-19 Vaccine  Discontinued    Prostate Cancer Screening  Discontinued        Results    MRI of the right shoulder shows no significant abnormalities.          Assessment:      ICD-10-CM    1. Hypertension associated with type 2 diabetes mellitus (CMS HCC)  (CMS HCC)  E11.59     I15.2       2. DM type 2 with diabetic mixed hyperlipidemia (CMS HCC)  (CMS HCC)  E11.69     E78.2       3. Shoulder tendonitis, right  M77.8 Large Joint Arthrocentesis: R glenohumeral     lidocaine  PF (XYLOCAINE -MPF) 1% injection     triamcinolone  acetonide (KENALOG -40) 40 mg/mL injection      4. Partial small bowel obstruction (CMS HCC)  K56.600       5. Chronic bilateral thoracic back pain  M54.6     G89.29       6. Nausea & vomiting  R11.2 US  GALLBLADDER     NUC CHOLESCINTIGRAPHY HEPATO IMAGING W EF     ondansetron  (ZOFRAN  ODT) 8 mg Oral Tablet, Rapid Dissolve         Orders Placed This Encounter    Large Joint Arthrocentesis: R glenohumeral    US  GALLBLADDER    NUC CHOLESCINTIGRAPHY HEPATO IMAGING W EF    lidocaine  PF (XYLOCAINE -MPF) 1% injection    triamcinolone  acetonide (KENALOG -40) 40 mg/mL injection    ondansetron  (ZOFRAN  ODT) 8 mg Oral Tablet, Rapid Dissolve     There are no discontinued medications.           Current diabetes medications:   Active Diabetes Meds   Medication Sig    glimepiride  (AMARYL ) 4 mg Oral Tablet Take 1 Tablet (4 mg total) by mouth Twice daily    MetFORMIN  (GLUCOPHAGE ) 1,000 mg Oral Tablet TAKE (1) TABLET TWICE A DAY WITH FOOD            Assessment & Plan     1. Chronic right  shoulder pain  Chronic right shoulder pain:  - Requested re-evaluation of the patient's MRI by a radiologist familiar with the patient's symptoms and examination findings, with specific focus on the right scapular region and surrounding musculature.  - Discussed potential treatment options with the patient, including:  - Administered a cortisone injection into the right shoulder region to assess if it provides symptomatic relief.  -  If the cortisone injection is effective, it may indicate an unusual presentation of a rotator cuff problem.  - If the cortisone injection is ineffective, will consider referral to orthopedics for further evaluation and management.  - If the cortisone injection is ineffective, will also consider referral for targeted physical therapy focusing on the infraspinatus and teres minor muscles.  - Will follow up with the patient once the radiologist's re-evaluation of the MRI is available, to discuss further treatment options based on the imaging findings and the patient's response to the cortisone injection.          Follow up:  No follow-ups on file.    This note was partially created using voice recognition software and is inherently subject to errors including those of syntax and sound alike  substitutions which may escape proof reading.  In such instances, original meaning may be extrapolated by contextual derivation.    Celeste Cola, DO  10/29/2023, 11:28         [1] No Known Allergies  [2]   Social History  Tobacco Use   Smoking Status Never    Passive exposure: Never   Smokeless Tobacco Never   [3]   Patient Active Problem List  Diagnosis    Anxiety state    BPH (benign prostatic hyperplasia)    Depression, acute    Diabetes mellitus, type 2    Elevated PSA    Generalized OA    Hypomagnesemia    Insomnia    Major depression, recurrent    Chronic bilateral thoracic back pain    Hypertension associated with type 2 diabetes mellitus (CMS HCC)  (CMS HCC)    Fatigue    Hereditary  hemochromatosis (CMS HCC)    Dupuytren's contracture of both hands    Ileus (CMS HCC)    Chronic cough    Partial small bowel obstruction (CMS HCC)    Dehydration    Colon cancer screening    Scapulothoracic syndrome    Chronic right shoulder pain    DM type 2 with diabetic mixed hyperlipidemia (CMS HCC)  (CMS HCC)    Syncope    Shoulder tendonitis, right    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

## 2023-10-29 NOTE — Procedures (Signed)
 [facility]  [address]  [place] [address]  Operated by [facility]  Procedure Note    Name: Steven Henderson MRN:  [id number]   Date: 10/29/2023 DOB:  [birth year] (72 y.o.)         Large Joint Arthrocentesis: R glenohumeral  Indications: pain  Details: 22 G needle, posterior approach  Outcome: tolerated well, no immediate complications  Procedure, treatment alternatives, risks and benefits explained, specific risks discussed. Consent was given by the patient. Immediately prior to procedure a time out was called to verify the correct patient, procedure, equipment, support staff and site/side marked as required. Patient was prepped and draped in the usual sterile fashion.           Celeste Cola, DO

## 2023-11-03 ENCOUNTER — Other Ambulatory Visit (INDEPENDENT_AMBULATORY_CARE_PROVIDER_SITE_OTHER): Payer: Self-pay | Admitting: Internal Medicine

## 2023-11-03 DIAGNOSIS — I10 Essential (primary) hypertension: Secondary | ICD-10-CM

## 2023-11-03 DIAGNOSIS — E782 Mixed hyperlipidemia: Secondary | ICD-10-CM

## 2023-11-09 ENCOUNTER — Ambulatory Visit (HOSPITAL_COMMUNITY): Payer: Self-pay

## 2023-11-17 ENCOUNTER — Other Ambulatory Visit: Payer: Self-pay

## 2023-11-18 ENCOUNTER — Ambulatory Visit
Admission: RE | Admit: 2023-11-18 | Discharge: 2023-11-18 | Disposition: A | Discharge status: Home / Self Care | Payer: Self-pay | Source: Ambulatory Visit | Attending: Internal Medicine | Admitting: Internal Medicine

## 2023-11-18 ENCOUNTER — Ambulatory Visit (HOSPITAL_BASED_OUTPATIENT_CLINIC_OR_DEPARTMENT_OTHER)
Admission: RE | Admit: 2023-11-18 | Discharge: 2023-11-18 | Disposition: A | Discharge status: Home / Self Care | Payer: Self-pay | Source: Ambulatory Visit | Attending: Internal Medicine | Admitting: Internal Medicine

## 2023-11-18 DIAGNOSIS — R112 Nausea with vomiting, unspecified: Secondary | ICD-10-CM

## 2023-11-18 DIAGNOSIS — K760 Fatty (change of) liver, not elsewhere classified: Secondary | ICD-10-CM

## 2023-11-18 MED ORDER — SINCALIDE 5 MCG SOLUTION FOR INJECTION
INTRAMUSCULAR | Status: AC
Start: 2023-11-18 — End: 2023-11-18
  Filled 2023-11-18: qty 5

## 2023-11-18 MED ORDER — SINCALIDE 5 MCG SOLUTION FOR INJECTION
0.0200 ug/kg | INTRAMUSCULAR | Status: AC
Start: 2023-11-18 — End: 2023-11-18
  Administered 2023-11-18: 1.9 ug via INTRAVENOUS

## 2023-11-23 ENCOUNTER — Other Ambulatory Visit (INDEPENDENT_AMBULATORY_CARE_PROVIDER_SITE_OTHER): Payer: Self-pay | Admitting: Internal Medicine

## 2023-11-23 DIAGNOSIS — R053 Chronic cough: Secondary | ICD-10-CM

## 2023-12-09 ENCOUNTER — Other Ambulatory Visit (INDEPENDENT_AMBULATORY_CARE_PROVIDER_SITE_OTHER): Payer: Self-pay | Admitting: Internal Medicine

## 2023-12-16 ENCOUNTER — Encounter (INDEPENDENT_AMBULATORY_CARE_PROVIDER_SITE_OTHER): Payer: Self-pay | Admitting: Internal Medicine

## 2023-12-16 ENCOUNTER — Ambulatory Visit: Payer: Self-pay | Attending: Internal Medicine | Admitting: Internal Medicine

## 2023-12-16 ENCOUNTER — Other Ambulatory Visit: Payer: Self-pay

## 2023-12-16 VITALS — BP 133/82 | HR 84 | Ht 67.0 in | Wt 205.8 lb

## 2023-12-16 DIAGNOSIS — R338 Other retention of urine: Secondary | ICD-10-CM | POA: Insufficient documentation

## 2023-12-16 DIAGNOSIS — F331 Major depressive disorder, recurrent, moderate: Secondary | ICD-10-CM | POA: Insufficient documentation

## 2023-12-16 DIAGNOSIS — E1169 Type 2 diabetes mellitus with other specified complication: Secondary | ICD-10-CM | POA: Insufficient documentation

## 2023-12-16 DIAGNOSIS — R5383 Other fatigue: Secondary | ICD-10-CM | POA: Insufficient documentation

## 2023-12-16 DIAGNOSIS — N401 Enlarged prostate with lower urinary tract symptoms: Secondary | ICD-10-CM | POA: Insufficient documentation

## 2023-12-16 DIAGNOSIS — M546 Pain in thoracic spine: Secondary | ICD-10-CM | POA: Insufficient documentation

## 2023-12-16 DIAGNOSIS — K219 Gastro-esophageal reflux disease without esophagitis: Secondary | ICD-10-CM | POA: Insufficient documentation

## 2023-12-16 DIAGNOSIS — E782 Mixed hyperlipidemia: Secondary | ICD-10-CM | POA: Insufficient documentation

## 2023-12-16 DIAGNOSIS — M25511 Pain in right shoulder: Secondary | ICD-10-CM | POA: Insufficient documentation

## 2023-12-16 DIAGNOSIS — G8929 Other chronic pain: Secondary | ICD-10-CM | POA: Insufficient documentation

## 2023-12-16 DIAGNOSIS — E1159 Type 2 diabetes mellitus with other circulatory complications: Secondary | ICD-10-CM | POA: Insufficient documentation

## 2023-12-16 DIAGNOSIS — I152 Hypertension secondary to endocrine disorders: Secondary | ICD-10-CM | POA: Insufficient documentation

## 2023-12-16 DIAGNOSIS — R053 Chronic cough: Secondary | ICD-10-CM | POA: Insufficient documentation

## 2023-12-16 MED ORDER — RABEPRAZOLE 20 MG TABLET,DELAYED RELEASE
20.0000 mg | DELAYED_RELEASE_TABLET | Freq: Two times a day (BID) | ORAL | 1 refills | Status: DC
Start: 2023-12-16 — End: 2024-01-25

## 2023-12-16 MED ORDER — HYDROCHLOROTHIAZIDE 50 MG TABLET
50.0000 mg | ORAL_TABLET | Freq: Every day | ORAL | 2 refills | Status: AC
Start: 2023-12-16 — End: ?

## 2023-12-16 NOTE — Progress Notes (Signed)
 [facility]  [address]  [place] [address]  Operated by [facility]     Name: Steven Henderson MRN:  [id number]   Date: 12/16/2023 Age: 73 y.o.       Chief Complaint:    Chief Complaint   Patient presents with    Follow-up After Testing       History of Present Illness  Steven Henderson is a 73 year old male with hypertension who presents with morning nausea and elevated blood pressure.    He experiences morning nausea and fatigue, which improve as the day progresses. He feels tired and experiences gagging in the mornings, somewhat alleviated by Zofran . No vomiting is reported, but he notes a bitter taste in his mouth after taking medication. Occasional headaches and snoring are present. No frequent vomiting, lightheadedness, or dizziness when standing.    His hypertension, initially well-controlled with medication, has been increasing over the past three to four months. He monitors his blood pressure at home and has noticed elevated readings. His current medications for hypertension include telmisartan  80 mg, hydrochlorothiazide  25 mg, and amlodipine  10 mg.    He has shoulder pain that has improved since receiving an injection, though stiffness and some pain with movement persist. He previously attended physical therapy without benefit. He experiences numbness in his right arm, which is the same side as his shoulder pain. He has had a brain MRI in the past.    He takes Aciphex  20 mg daily for reflux and Zofran  as needed for nausea. He also takes metformin  in the morning, which may contribute to his symptoms.       Past Medical History:  Past Medical History:   Diagnosis Date    Anxiety state     BPH (benign prostatic hyperplasia)     Depression, acute     Diabetes mellitus, type 2     Elevated PSA     Generalized OA     Hemochromatosis     Hyperlipidemia     Hypertension     Hypomagnesemia     Insomnia     Syncope     Testicular hypofunction     Vitamin D  deficiency          Past Surgical History:   Procedure Laterality Date    CYSTOSCOPY  06/23/2017    FINGER SURGERY Left     RING FINGER    HAND SURGERY Right     HX APPENDECTOMY        Current Outpatient Medications   Medication Sig    amLODIPine  (NORVASC ) 10 mg Oral Tablet Take 1 Tablet (10 mg total) by mouth Once a day    aspirin  (ECOTRIN) 81 mg Oral Tablet, Delayed Release (E.C.) Take 1 Tablet (81 mg total) by mouth Daily    finasteride  (PROSCAR ) 5 mg Oral Tablet Take 1 Tablet (5 mg total) by mouth Daily    glimepiride  (AMARYL ) 4 mg Oral Tablet Take 1 Tablet (4 mg total) by mouth Twice daily    hydroCHLOROthiazide  (HYDRODIURIL ) 25 mg Oral Tablet TAKE (1) TABLET DAILY    meloxicam (MOBIC) 15 mg Oral Tablet TAKE (1) TABLET DAILY WITH FOOD.    MetFORMIN  (GLUCOPHAGE ) 1,000 mg Oral Tablet TAKE (1) TABLET TWICE A DAY WITH FOOD    ondansetron  (ZOFRAN  ODT) 8 mg Oral Tablet, Rapid Dissolve Place 1 Tablet (8 mg total) under the tongue Every 8 hours as needed for Nausea/Vomiting    RABEprazole  (ACIPHEX ) 20 mg  Oral Tablet, Delayed Release (E.C.) TAKE (1) TABLET DAILY    rosuvastatin  (CRESTOR ) 10 mg Oral Tablet TAKE 1 TABLET IN THE EVENING    tamsulosin  (FLOMAX ) 0.4 mg Oral Capsule TAKE (1) CAPSULE DAILY IN THE EVENING.    telmisartan  (MICARDIS ) 80 mg Oral Tablet TAKE (1) TABLET DAILY     Allergies[1]    Family History:  Family Medical History:       Problem Relation (Age of Onset)    Liver Cancer Father    Stroke Mother              Social History:   Tobacco Use History[2]  Social History     Substance and Sexual Activity   Alcohol Use Not Currently     Social History     Occupational History    Not on file       Review of Systems:  Review of systems as discussed in HPI      Problem List:  Problem List[3]    Physical Examination:  BP (!) 143/82 (Site: Left Arm, Patient Position: Sitting, Cuff Size: Adult)   Pulse 84   Ht 1.702 m (5' 7)   Wt 93.4 kg (205 lb 12.8 oz)   SpO2 97%   BMI 32.23 kg/m       Physical  Exam  Vitals and nursing note reviewed.   Constitutional:       General: He is not in acute distress.     Appearance: Normal appearance.   HENT:      Head: Normocephalic.      Right Ear: Tympanic membrane normal.      Left Ear: Tympanic membrane normal.      Nose: Nose normal.      Mouth/Throat:      Mouth: Mucous membranes are moist.   Eyes:      General: No scleral icterus.     Extraocular Movements: Extraocular movements intact.      Conjunctiva/sclera: Conjunctivae normal.      Pupils: Pupils are equal, round, and reactive to light.   Neck:      Vascular: No carotid bruit.   Cardiovascular:      Rate and Rhythm: Normal rate and regular rhythm.      Pulses: Normal pulses.           Dorsalis pedis pulses are 2+ on the right side and 2+ on the left side.        Posterior tibial pulses are 2+ on the right side and 2+ on the left side.      Heart sounds: Normal heart sounds.   Pulmonary:      Effort: Pulmonary effort is normal.      Breath sounds: Normal breath sounds. No wheezing, rhonchi or rales.   Abdominal:      General: Abdomen is flat. Bowel sounds are normal.      Tenderness: There is no abdominal tenderness. There is no guarding.   Musculoskeletal:         General: No swelling. Normal range of motion.      Cervical back: Normal range of motion. No tenderness.      Right lower leg: No edema.      Left lower leg: No edema.   Skin:     General: Skin is warm.      Capillary Refill: Capillary refill takes less than 2 seconds.      Findings: Ecchymosis present.   Neurological:      General: No focal  deficit present.      Mental Status: He is alert and oriented to person, place, and time.      Cranial Nerves: No cranial nerve deficit.          Health Maintenance:  Health Maintenance   Topic Date Due    Influenza Vaccine (1) 01/17/2024    Diabetic A1C  03/09/2024    Diabetic Retinal Exam  04/16/2024    Diabetic Kidney Health eGFR  07/21/2024    Medicare Annual Wellness Visit  09/07/2024    Diabetic Kidney Health  Microalb/Cr Ratio  09/07/2024    Colonoscopy  11/24/2032    Pneumococcal Vaccination, Age 38+  Completed    Adult Tdap-Td  Discontinued    Hepatitis C screening  Discontinued    Shingles Vaccine  Discontinued    Covid-19 Vaccine  Discontinued    Prostate Cancer Screening  Discontinued        Results           FIB-4 Calculation: 1.49 at 07/22/2023  9:16 AM  Calculated from:  SGOT/AST: 25 U/L at 07/22/2023  9:16 AM  SGPT/ALT: 38 U/L at 07/22/2023  9:16 AM  Platelets: 193 x10^3/uL at 07/22/2023  9:16 AM  Age: 62 years     Assessment/Plan:      ICD-10-CM    1. Hypertension associated with type 2 diabetes mellitus (CMS HCC)  (CMS HCC)  E11.59     I15.2       2. DM type 2 with diabetic mixed hyperlipidemia (CMS HCC)  (CMS HCC)  E11.69     E78.2          No orders of the defined types were placed in this encounter.    Medications Discontinued During This Encounter   Medication Reason    fluticasone-umeclidin-vilanter (TRELEGY ELLIPTA ) 100-62.5-25 mcg Inhalation Disk with Device Medication Reconciliation              Current diabetes medications:   Active Diabetes Meds   Medication Sig    glimepiride  (AMARYL ) 4 mg Oral Tablet Take 1 Tablet (4 mg total) by mouth Twice daily    MetFORMIN  (GLUCOPHAGE ) 1,000 mg Oral Tablet TAKE (1) TABLET TWICE A DAY WITH FOOD            Assessment & Plan  Gastroesophageal reflux disease (GERD)  Morning nausea and bitter taste suggestive of GERD, possibly exacerbated by metformin  and amlodipine . Differential includes reflux, medication side effects, and sleep apnea.  - Increase Aciphex  to twice daily, morning and evening  - Consider endoscopy if symptoms persist  - Evaluate for sleep apnea if symptoms do not improve with medication adjustment    Hypertension associated with type 2 diabetes mellitus  Blood pressure has been increasing over the last 3-4 months. Currently on maximum doses of telmisartan  and amlodipine . Hydrochlorothiazide  can be increased. Consider beta blocker due to high heart rate.  -  Increase hydrochlorothiazide  to 50 mg daily  - Monitor sodium levels in one month  - Check A1c in one month    Type 2 diabetes mellitus with diabetic mixed hyperlipidemia  No specific discussion in this encounter.    Chronic right shoulder pain  Pain improved with injection. Options include continued injections, targeted physical therapy, or orthopedic consultation. Previous PT was not beneficial, and he declined exploratory surgery.  - Continue shoulder injections every three months    Chronic fatigue  Fatigue in the morning, improving throughout the day. Possible causes include poor sleep quality, GERD, or medication side  effects.  - Monitor response to increased Aciphex  dosing  - Consider home sleep study if fatigue persists       Follow up:  No follow-ups on file.    This note was partially created using voice recognition software and is inherently subject to errors including those of syntax and sound alike  substitutions which may escape proof reading.  In such instances, original meaning may be extrapolated by contextual derivation.    This note was created with assistance from [employer] via capture of conversational audio. Consent was obtained from the patient and all parties present prior to recording.      Krystal DELENA Sharps, DO  12/16/2023, 08:32           [1] No Known Allergies  [2]   Social History  Tobacco Use   Smoking Status Never    Passive exposure: Never   Smokeless Tobacco Never   [3]   Patient Active Problem List  Diagnosis    Anxiety state    BPH (benign prostatic hyperplasia)    Depression, acute    Diabetes mellitus, type 2    Elevated PSA    Generalized OA    Hypomagnesemia    Insomnia    Major depression, recurrent    Chronic bilateral thoracic back pain    Hypertension associated with type 2 diabetes mellitus (CMS HCC)  (CMS HCC)    Fatigue    Hereditary hemochromatosis (CMS HCC)    Dupuytren's contracture of both hands    Ileus (CMS HCC)    Chronic cough    Partial small bowel obstruction (CMS  HCC)    Dehydration    Colon cancer screening    Scapulothoracic syndrome    Chronic right shoulder pain    DM type 2 with diabetic mixed hyperlipidemia (CMS HCC)  (CMS HCC)    Syncope    Shoulder tendonitis, right    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

## 2024-01-03 ENCOUNTER — Other Ambulatory Visit (INDEPENDENT_AMBULATORY_CARE_PROVIDER_SITE_OTHER): Payer: Self-pay | Admitting: Internal Medicine

## 2024-01-08 ENCOUNTER — Other Ambulatory Visit (INDEPENDENT_AMBULATORY_CARE_PROVIDER_SITE_OTHER): Payer: Self-pay | Admitting: Internal Medicine

## 2024-01-08 DIAGNOSIS — I152 Hypertension secondary to endocrine disorders: Secondary | ICD-10-CM

## 2024-01-14 ENCOUNTER — Other Ambulatory Visit: Payer: Self-pay

## 2024-01-14 ENCOUNTER — Ambulatory Visit (HOSPITAL_COMMUNITY): Payer: Self-pay | Admitting: Internal Medicine

## 2024-01-14 ENCOUNTER — Ambulatory Visit: Attending: Internal Medicine

## 2024-01-14 DIAGNOSIS — E1159 Type 2 diabetes mellitus with other circulatory complications: Secondary | ICD-10-CM | POA: Insufficient documentation

## 2024-01-14 DIAGNOSIS — R5383 Other fatigue: Secondary | ICD-10-CM | POA: Insufficient documentation

## 2024-01-14 DIAGNOSIS — E782 Mixed hyperlipidemia: Secondary | ICD-10-CM

## 2024-01-14 DIAGNOSIS — E1169 Type 2 diabetes mellitus with other specified complication: Secondary | ICD-10-CM | POA: Insufficient documentation

## 2024-01-14 DIAGNOSIS — I152 Hypertension secondary to endocrine disorders: Secondary | ICD-10-CM | POA: Insufficient documentation

## 2024-01-14 LAB — COMPREHENSIVE METABOLIC PANEL, NON-FASTING
ALBUMIN/GLOBULIN RATIO: 1.4 (ref 0.8–1.4)
ALBUMIN: 4.4 g/dL (ref 3.5–5.7)
ALKALINE PHOSPHATASE: 47 U/L (ref 34–104)
ALT (SGPT): 26 U/L (ref 7–52)
ANION GAP: 8 mmol/L (ref 4–13)
AST (SGOT): 21 U/L (ref 13–39)
BILIRUBIN TOTAL: 0.5 mg/dL (ref 0.3–1.0)
BUN/CREA RATIO: 23 — ABNORMAL HIGH (ref 6–22)
BUN: 34 mg/dL — ABNORMAL HIGH (ref 7–25)
CALCIUM, CORRECTED: 9.8 mg/dL (ref 8.9–10.8)
CALCIUM: 10.1 mg/dL (ref 8.6–10.3)
CHLORIDE: 101 mmol/L (ref 98–107)
CO2 TOTAL: 27 mmol/L (ref 21–31)
CREATININE: 1.45 mg/dL — ABNORMAL HIGH (ref 0.60–1.30)
ESTIMATED GFR: 51 mL/min/1.73mˆ2 — ABNORMAL LOW (ref >59)
GLOBULIN: 3.1 (ref 2.0–3.5)
GLUCOSE: 205 mg/dL — ABNORMAL HIGH (ref 74–109)
OSMOLALITY, CALCULATED: 286 mosm/kg (ref 270–290)
POTASSIUM: 4.5 mmol/L (ref 3.5–5.1)
PROTEIN TOTAL: 7.5 g/dL (ref 6.4–8.9)
SODIUM: 136 mmol/L (ref 136–145)

## 2024-01-14 LAB — HGA1C (HEMOGLOBIN A1C WITH EST AVG GLUCOSE): HEMOGLOBIN A1C: 6.7 % — ABNORMAL HIGH (ref 4.0–6.0)

## 2024-01-14 LAB — CBC
HCT: 41.2 % (ref 36.7–47.1)
HGB: 14.7 g/dL (ref 12.5–16.3)
MCH: 32.5 pg (ref 23.8–33.4)
MCHC: 35.7 g/dL (ref 32.5–36.3)
MCV: 91 fL (ref 73.0–96.2)
MPV: 10.4 fL (ref 7.4–11.4)
PLATELETS: 162 x10ˆ3/uL (ref 140–440)
RBC: 4.52 x10ˆ6/uL (ref 4.06–5.63)
RDW: 13.6 % (ref 12.1–16.2)
WBC: 7.5 x10ˆ3/uL (ref 3.6–10.2)

## 2024-01-14 LAB — MICROALBUMIN/CREATININE RATIO, URINE, RANDOM
CREATININE RANDOM URINE: 104 mg/dL (ref 30–125)
MICROALBUMIN RANDOM URINE: 1.3 mg/dL
MICROALBUMIN/CREATININE RATIO RANDOM URINE: 12.5 mg/g

## 2024-01-14 LAB — THYROID STIMULATING HORMONE (SENSITIVE TSH): TSH: 2.529 u[IU]/mL (ref 0.450–5.330)

## 2024-01-19 ENCOUNTER — Other Ambulatory Visit (INDEPENDENT_AMBULATORY_CARE_PROVIDER_SITE_OTHER): Payer: Self-pay | Admitting: NURSE PRACTITIONER

## 2024-01-19 NOTE — Cancer Center Note (Incomplete)
 [place]  [facility]       [facility]       CANCER CENTER NOTE     Date: 01/19/2024  Name: Steven Henderson  MRN: [id number]  Referring Physician: Tobie Hitt, MD  Primary Care Provider: Krystal DELENA Sharps, DO    REASON FOR VISIT:   No chief complaint on file.       HISTORY OF PRESENT ILLNESS:   73 y.o.male from [facility] [id number] for evaluation and management of hemochromatosis.  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   He has been undergoing phlebotomy treatments which he has been tolerating very well.      His last phlebotomy was in February 2022 which he tolerated really well.  He did not need a phlebotomy after that because of low ferritin.    07/22/2023 Today the patient returns for follow up regarding his hemochromatosis today. He has not required phlebotomy for some time.     REVIEW OF SYSTEMS:  Review of Systems   Constitutional:  Negative for appetite change, chills, fatigue and fever.   HENT:  Negative.     Eyes:  Positive for eye problems (worsening Right eye only under care of Opthamologist).   Respiratory:  Positive for cough (post Flu infection). Negative for shortness of breath.    Cardiovascular: Negative.  Negative for chest pain, leg swelling and palpitations.   Gastrointestinal:  Positive for abdominal pain (intermittently to the lower abdoemen that is described as gnawing pain worse in the am, with some nausea) and nausea (in the am). Negative for blood in stool, constipation, diarrhea and vomiting.   Genitourinary:  Positive for difficulty urinating (slow urination unchanged). Negative for hematuria.    Musculoskeletal:  Positive for arthralgias (generalized, Right shoulder blade) and back pain.   Skin: Negative.    Neurological:  Positive for dizziness, light-headedness and numbness (to fingers Carpal tunnel).   Hematological:  Negative for adenopathy. Bruises/bleeds easily.   Psychiatric/Behavioral:  Negative for  depression and sleep disturbance. The patient is not nervous/anxious.          PAST MEDICAL HISTORY:  Past Medical History:   Diagnosis Date    Anxiety state     BPH (benign prostatic hyperplasia)     Depression, acute     Diabetes mellitus, type 2     Elevated PSA     Generalized OA     Hemochromatosis     Hyperlipidemia     Hypertension     Hypomagnesemia     Insomnia     Syncope     Testicular hypofunction     Vitamin D deficiency      MEDICATIONS:  Current Outpatient Medications   Medication Sig    amLODIPine  (NORVASC ) 10 mg Oral Tablet TAKE (1) TABLET DAILY    aspirin  (ECOTRIN) 81 mg Oral Tablet, Delayed Release (E.C.) Take 1 Tablet (81 mg total) by mouth Daily    finasteride  (PROSCAR ) 5 mg Oral Tablet Take 1 Tablet (5 mg total) by mouth Daily    glimepiride  (AMARYL ) 4 mg Oral Tablet Take 1 Tablet (4 mg total) by mouth Twice daily    hydroCHLOROthiazide  (HYDRODIURIL ) 50 mg Oral Tablet Take 1 Tablet (50 mg total) by mouth Daily    meloxicam (MOBIC) 15 mg Oral Tablet TAKE (1) TABLET DAILY WITH FOOD.    MetFORMIN  (GLUCOPHAGE ) 1,000 mg Oral Tablet TAKE (1) TABLET TWICE A DAY WITH FOOD    ondansetron  (ZOFRAN  ODT) 8 mg Oral Tablet,  Rapid Dissolve Place 1 Tablet (8 mg total) under the tongue Every 8 hours as needed for Nausea/Vomiting    RABEprazole  (ACIPHEX ) 20 mg Oral Tablet, Delayed Release (E.C.) Take 1 Tablet (20 mg total) by mouth Twice daily    rosuvastatin  (CRESTOR ) 10 mg Oral Tablet TAKE 1 TABLET IN THE EVENING    tamsulosin  (FLOMAX ) 0.4 mg Oral Capsule TAKE (1) CAPSULE DAILY IN THE EVENING.    telmisartan  (MICARDIS ) 80 mg Oral Tablet TAKE (1) TABLET DAILY     ALLERGIES:  No Known Allergies  PAST SURGICAL HISTORY:  Past Surgical History:   Procedure Laterality Date    CYSTOSCOPY  06/23/2017    FINGER SURGERY Left     RING FINGER    HAND SURGERY Right     HX APPENDECTOMY       SOCIAL HISTORY:  Social History     Tobacco Use   Smoking Status Never    Passive exposure: Never   Smokeless Tobacco Never       E-Cigarette Vaping History:  E-Cigarette/Vaping Use: Never User    Substances vaped:  Nicotine: No  CBD: No    Additional information:  Disposable: No  Refillable Tank: No     FAMILY HISTORY:  Family Medical History:       Problem Relation (Age of Onset)    Liver Cancer Father    Stroke Mother          PHYSICAL EXAMINATION:    There were no vitals filed for this visit.      There is no height or weight on file to calculate BMI.    ECOG PS 0  Physical Exam  Constitutional:       General: He is not in acute distress.     Appearance: Normal appearance.   HENT:      Head: Normocephalic and atraumatic.      Nose: Nose normal.      Mouth/Throat:      Mouth: Mucous membranes are moist.      Pharynx: Oropharynx is clear.   Eyes:      General: No scleral icterus.        Right eye: No discharge.         Left eye: No discharge.      Pupils: Pupils are equal, round, and reactive to light.   Cardiovascular:      Rate and Rhythm: Normal rate and regular rhythm.      Pulses: Normal pulses.      Heart sounds: Normal heart sounds.   Pulmonary:      Effort: Pulmonary effort is normal.      Breath sounds: Normal breath sounds.   Abdominal:      General: Abdomen is flat. Bowel sounds are normal.      Palpations: Abdomen is soft. There is no mass.      Tenderness: There is no abdominal tenderness.      Hernia: No hernia is present.   Musculoskeletal:         General: Normal range of motion.      Cervical back: Normal range of motion and neck supple.      Right lower leg: No edema.      Left lower leg: No edema.   Lymphadenopathy:      Upper Body:      Right upper body: No supraclavicular adenopathy.      Left upper body: No supraclavicular adenopathy.   Skin:     General: Skin is  warm and dry.      Capillary Refill: Capillary refill takes 2 to 3 seconds.      Findings: Bruising present. No erythema.   Neurological:      General: No focal deficit present.      Mental Status: He is alert and oriented to person, place, and time.       Motor: No weakness.      Gait: Gait normal.   Psychiatric:         Mood and Affect: Mood normal.        Diagnostic Data Reviewed    Liver Biopsy dated 02/23/2006-  Diffuse fatty infiltration with focal deposits of intracytoplasmic iron granules suggestive of hemochromatosis.  No evidence of cirrhosis.      LABORATORY:  CBC  Diff   Lab Results   Component Value Date/Time    WBC 7.5 01/14/2024 08:32 AM    HGB 14.7 01/14/2024 08:32 AM    HCT 41.2 01/14/2024 08:32 AM    PLTCNT 162 01/14/2024 08:32 AM    RBC 4.52 01/14/2024 08:32 AM    MCV 91.0 01/14/2024 08:32 AM    MCHC 35.7 01/14/2024 08:32 AM    MCH 32.5 01/14/2024 08:32 AM    RDW 13.6 01/14/2024 08:32 AM    MPV 10.4 01/14/2024 08:32 AM    Lab Results   Component Value Date/Time    PMNS 63 07/22/2023 09:16 AM    LYMPHOCYTES 21 07/22/2023 09:16 AM    EOSINOPHIL 3 07/22/2023 09:16 AM    MONOCYTES 13 07/22/2023 09:16 AM    BASOPHILS 1 07/22/2023 09:16 AM    BASOPHILS 0.00 07/22/2023 09:16 AM    PMNABS 4.40 07/22/2023 09:16 AM    LYMPHSABS 1.40 07/22/2023 09:16 AM    EOSABS 0.20 07/22/2023 09:16 AM    MONOSABS 0.90 07/22/2023 09:16 AM            Comprehensive Metabolic Profile    Lab Results   Component Value Date    SODIUM 136 01/14/2024    POTASSIUM 4.5 01/14/2024    CHLORIDE 101 01/14/2024    CO2 27 01/14/2024    ANIONGAP 8 01/14/2024    BUN 34 (H) 01/14/2024    CREATININE 1.45 (H) 01/14/2024    ALBUMIN 4.4 01/14/2024    CALCIUM 10.1 01/14/2024    GLUCOSENF 205 (H) 01/14/2024    ALKPHOS 47 01/14/2024    ALT 26 01/14/2024    AST 21 01/14/2024    TOTBILIRUBIN 0.5 01/14/2024    TOTALPROTEIN 7.5 01/14/2024         ESTIMATED GFR   Date Value Ref Range Status   01/14/2024 51 (L) >59 mL/min/1.71m^2 Final       IRON   Date Value Ref Range Status   07/22/2023 120 50 - 212 ug/dL Final     FERRITIN   Date Value Ref Range Status   07/22/2023 47 24 - 336 ng/mL Final     TOTAL IRON BINDING CAPACITY   Date Value Ref Range Status   07/22/2023 335 250 - 450 ug/dL Final     UIBC   Date  Value Ref Range Status   07/22/2023 215 130 - 375 ug/dL Final     IRON (TRANSFERRIN) SATURATION   Date Value Ref Range Status   07/22/2023 36 20 - 50 % Final     VITAMIN B 12   Date Value Ref Range Status   10/14/2021 720 180 - 914 pg/mL Final     04/23/2023 PSA 1.68  IMAGING:    05/13/2023 US  Liver  The liver measures 14.3 cm in AP diameter. Normal measures less than or equal to 15.5 cm in AP diameter. The liver again exhibits an overall increase in echogenicity. No focal hepatic mass is identified. The gallbladder is normally distended.  No gallstones are seen within its lumen. No pericholecystic fluid or gallbladder wall thickening is identified. The common hepatic duct measures 4.8 mm in diameter.    IMPRESSION:  No focal hepatic mass is identified. The liver again exhibits an overall increase in echogenicity. No evidence of cholelithiasis is seen.      07/23/22 CT Chest   IMPRESSION:  Clear lungs   Lower esophageal wall thickening raises the suspicion for esophagitis.    07/23/2022 CT Abdomen and Pelvis   Nonspecific fluid distended loops of small bowel in the mid and lower abdomen. Findings could reflect ileus in the setting of infection or early partial small bowel obstruction, though no discrete transition point is identified.   Lower esophageal wall thickening may be correlated for signs and symptoms of esophageal reflux.     07/24/22 KUB- A nonobstructive bowel gas pattern is noted     07/30/2022 KUB NO ACUTE FINDINGS     08/06/2022 X-ray T Spine- MILD DEGENERATIVE DISC DISEASE.           ASSESSMENT:   1.  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  Last therapeutic phlebotomy 1-2 years ago, per patient.     PLAN:  All relative external and internal medical records were reviewed including available H&Ps, progress notes, procedure notes, imaging's, laboratories, and pathology    1. Hereditary Hemochromatosis- PER  UptoDate  Heterozygosity for HFE C282Y generally produces an unaffected carrier state with similar risk of iron overload as the general population. In some heterozygotes, concomitant liver disease may cause abnormal iron studies.   compound heterozygosity with C282Y (C282Y/H63D), generally in combination with other causes of liver disease that contribute to iron overload, such as alcohol, hepatitis C virus (HCV) infection, or metabolic dysfunction-associated steatotic liver disease   heterozygosity (C282Y/H63D) is seen in a few percent of individuals with HH, usually with mild, non-progressive iron overload and with other contributory factors such as liver disease from excess alcohol intake, HCV infection, or metabolic dysfunction-associated steatotic liver disease; such other factors need to be searched for and corrected whenever possible   The patient's current iron saturation is greater than 45% at 56 % today.  His ferritin though remains within normal range at 33.  No evidence of iron low at overload at this time.  Returned for follow up with CBC, CMP, iron panel, ferritin 6 months     2. RTC 6  months     The patient was given the opportunity to ask questions, and these were answered to their satisfaction. They are welcome to call with any questions or concerns at any point.      On the day of the encounter, I spent a total of  23 minutes on this patient encounter including review of historical information, examination, documentation and post-visit activities.       Romualdo Moose APRN, FNP-BC 9/3/202514:01

## 2024-01-19 NOTE — Telephone Encounter (Signed)
 Patient wants to know when he should redo this lab work, how long to wait?  Thanks Olivia

## 2024-01-20 ENCOUNTER — Ambulatory Visit (INDEPENDENT_AMBULATORY_CARE_PROVIDER_SITE_OTHER): Payer: Self-pay | Admitting: NURSE PRACTITIONER

## 2024-01-20 ENCOUNTER — Ambulatory Visit (INDEPENDENT_AMBULATORY_CARE_PROVIDER_SITE_OTHER): Payer: Self-pay

## 2024-01-21 ENCOUNTER — Ambulatory Visit (INDEPENDENT_AMBULATORY_CARE_PROVIDER_SITE_OTHER): Payer: Self-pay | Admitting: Internal Medicine

## 2024-01-24 NOTE — Cancer Center Note (Signed)
 [place]  [facility]       [facility]       CANCER CENTER NOTE     Date: 01/25/2024  Name: Steven Henderson  MRN: [id number]  Referring Physician: Claudene Krystal LABOR, DO  Primary Care Provider: Krystal LABOR Claudene, DO    REASON FOR VISIT:   Chief Complaint   Patient presents with    Hemachromatosis        HISTORY OF PRESENT ILLNESS:   73 y.o.male from [facility] [address] for evaluation and management of hemochromatosis.  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   He has been undergoing phlebotomy treatments which he has been tolerating very well.      His last phlebotomy was in February 2022 which he tolerated really well.  He did not need a phlebotomy after that because of low ferritin.    07/22/2023 Today the patient returns for follow up regarding his hemochromatosis today. He has not required phlebotomy for some time.     01/25/2024: The patient is here for a follow up of hemochromatosis today. Patient reports that recently his kidney function started to decline and his PCP has stopped his Mobic. He reports fatigue that is worse. He also reports about a 15 lb weight loss over approximately 3-4 months without trying. He continues to have daily nausea that is worse in the morning and abdominal pain. He has had an US  gallbladder and HIDA scan both of which were okay. Patient reports that his last EGD and colonoscopy were this year and no abnormalities were noted.     REVIEW OF SYSTEMS:  Review of Systems   Constitutional:  Positive for fatigue and unexpected weight change (weightloss 15 last 3-4 months). Negative for appetite change, chills and fever.   HENT:  Negative.     Eyes:  Positive for eye problems (unchanged).   Respiratory:  Positive for cough (occasionally). Negative for chest tightness and shortness of breath.    Cardiovascular:  Negative for chest pain, leg swelling and palpitations.   Gastrointestinal:  Positive for abdominal pain  (intermittently to the lower abdomen that is described as gnawing pain worse in the am with some nausea), constipation and nausea (in the am last couple months). Negative for blood in stool, diarrhea and vomiting.   Genitourinary:  Positive for nocturia. Negative for difficulty urinating and hematuria.    Musculoskeletal:  Positive for arthralgias and back pain.   Skin: Negative.    Neurological:  Positive for dizziness (occasionally), light-headedness (occasionally) and numbness (fingers).   Hematological:  Negative for adenopathy. Bruises/bleeds easily.   Psychiatric/Behavioral:  Negative for depression and sleep disturbance. The patient is not nervous/anxious.          PAST MEDICAL HISTORY:  Past Medical History:   Diagnosis Date    Anxiety state     BPH (benign prostatic hyperplasia)     Depression, acute     Diabetes mellitus, type 2     Elevated PSA     Generalized OA     Hemochromatosis     Hyperlipidemia     Hypertension     Hypomagnesemia     Insomnia     Syncope     Testicular hypofunction     Vitamin D deficiency      MEDICATIONS:  Current Outpatient Medications   Medication Sig    amLODIPine  (NORVASC ) 10 mg Oral Tablet TAKE (1) TABLET DAILY    aspirin  (ECOTRIN) 81 mg Oral Tablet, Delayed  Release (E.C.) Take 1 Tablet (81 mg total) by mouth Daily    finasteride  (PROSCAR ) 5 mg Oral Tablet Take 1 Tablet (5 mg total) by mouth Daily    glimepiride  (AMARYL ) 4 mg Oral Tablet Take 1 Tablet (4 mg total) by mouth Twice daily    hydroCHLOROthiazide  (HYDRODIURIL ) 50 mg Oral Tablet Take 1 Tablet (50 mg total) by mouth Daily    MetFORMIN  (GLUCOPHAGE ) 1,000 mg Oral Tablet TAKE (1) TABLET TWICE A DAY WITH FOOD    ondansetron  (ZOFRAN  ODT) 8 mg Oral Tablet, Rapid Dissolve Place 1 Tablet (8 mg total) under the tongue Every 8 hours as needed for Nausea/Vomiting    rosuvastatin  (CRESTOR ) 10 mg Oral Tablet TAKE 1 TABLET IN THE EVENING    tamsulosin  (FLOMAX ) 0.4 mg Oral Capsule TAKE (1) CAPSULE DAILY IN THE EVENING.     telmisartan  (MICARDIS ) 80 mg Oral Tablet TAKE (1) TABLET DAILY     ALLERGIES:  No Known Allergies  PAST SURGICAL HISTORY:  Past Surgical History:   Procedure Laterality Date    CYSTOSCOPY  06/23/2017    FINGER SURGERY Left     RING FINGER    HAND SURGERY Right     HX APPENDECTOMY       SOCIAL HISTORY:  Social History     Tobacco Use   Smoking Status Never    Passive exposure: Never   Smokeless Tobacco Never      E-Cigarette Vaping History:  E-Cigarette/Vaping Use: Never User    Substances vaped:  Nicotine: No  CBD: No    Additional information:  Disposable: No  Refillable Tank: No     FAMILY HISTORY:  Family Medical History:       Problem Relation (Age of Onset)    Liver Cancer Father    Stroke Mother          PHYSICAL EXAMINATION:    Vitals:    01/25/24 0815   BP: 128/75   Pulse: (!) 106   Temp: 36.9 C (98.5 F)   TempSrc: Temporal   SpO2: 96%   Weight: 90.8 kg (200 lb 1.6 oz)   Height: 1.702 m (5' 7)   BMI: 31.34         Body mass index is 31.34 kg/m.    ECOG PS 0    Physical Exam  Vitals reviewed.   Constitutional:       General: He is not in acute distress.     Appearance: Normal appearance. He is not ill-appearing.   HENT:      Head: Normocephalic and atraumatic.      Right Ear: External ear normal.      Left Ear: External ear normal.      Nose: Nose normal.      Mouth/Throat:      Mouth: Mucous membranes are moist.      Pharynx: Oropharynx is clear.   Eyes:      General: No scleral icterus.        Right eye: No discharge.         Left eye: No discharge.      Conjunctiva/sclera: Conjunctivae normal.   Cardiovascular:      Rate and Rhythm: Normal rate and regular rhythm.      Pulses: Normal pulses.      Heart sounds: Normal heart sounds.   Pulmonary:      Effort: Pulmonary effort is normal. No respiratory distress.      Breath sounds: Normal breath sounds. No wheezing or rhonchi.  Abdominal:      General: Bowel sounds are normal. There is no distension.      Palpations: Abdomen is soft. There is no mass.       Tenderness: There is no abdominal tenderness.      Hernia: No hernia is present.   Musculoskeletal:         General: Normal range of motion.      Cervical back: Normal range of motion and neck supple. No rigidity.      Right lower leg: No edema.      Left lower leg: No edema.   Lymphadenopathy:      Upper Body:      Right upper body: No supraclavicular adenopathy.      Left upper body: No supraclavicular adenopathy.   Skin:     General: Skin is warm and dry.      Coloration: Skin is not jaundiced or pale.      Findings: No erythema.   Neurological:      General: No focal deficit present.      Mental Status: He is alert and oriented to person, place, and time.      Motor: No weakness.      Gait: Gait normal.   Psychiatric:         Mood and Affect: Mood normal.        Diagnostic Data Reviewed    Liver Biopsy dated 02/23/2006-  Diffuse fatty infiltration with focal deposits of intracytoplasmic iron granules suggestive of hemochromatosis.  No evidence of cirrhosis.      LABORATORY:  CBC  Diff     CBC  Diff   Lab Results   Component Value Date/Time    WBC 9.1 01/25/2024 08:23 AM    HGB 15.0 01/25/2024 08:23 AM    HCT 42.6 01/25/2024 08:23 AM    PLTCNT 181 01/25/2024 08:23 AM    RBC 4.64 01/25/2024 08:23 AM    MCV 91.8 01/25/2024 08:23 AM    MCHC 35.1 01/25/2024 08:23 AM    MCH 32.3 01/25/2024 08:23 AM    RDW 13.4 01/25/2024 08:23 AM    MPV 10.0 01/25/2024 08:23 AM    Lab Results   Component Value Date/Time    PMNS 72 01/25/2024 08:23 AM    LYMPHOCYTES 16 01/25/2024 08:23 AM    EOSINOPHIL 4 01/25/2024 08:23 AM    MONOCYTES 8 01/25/2024 08:23 AM    BASOPHILS 1 01/25/2024 08:23 AM    BASOPHILS 0.10 01/25/2024 08:23 AM    PMNABS 6.50 01/25/2024 08:23 AM    LYMPHSABS 1.40 01/25/2024 08:23 AM    EOSABS 0.30 01/25/2024 08:23 AM    MONOSABS 0.70 01/25/2024 08:23 AM            Comprehensive Metabolic Profile    Lab Results   Component Value Date    SODIUM 136 01/25/2024    POTASSIUM 4.5 01/25/2024    CHLORIDE 101 01/25/2024     CO2 26 01/25/2024    ANIONGAP 9 01/25/2024    BUN 25 01/25/2024    CREATININE 1.53 (H) 01/25/2024    ALBUMIN 4.4 01/25/2024    CALCIUM 10.2 01/25/2024    GLUCOSENF 194 (H) 01/25/2024    ALKPHOS 50 01/25/2024    ALT 31 01/25/2024    AST 22 01/25/2024    TOTBILIRUBIN 0.9 01/25/2024    TOTALPROTEIN 7.5 01/25/2024         ESTIMATED GFR   Date Value Ref Range Status   01/25/2024 48 (L) >  59 mL/min/1.29m^2 Final       IRON   Date Value Ref Range Status   01/25/2024 149 50 - 212 ug/dL Final     FERRITIN   Date Value Ref Range Status   01/25/2024 34 24 - 336 ng/mL Final     TOTAL IRON BINDING CAPACITY   Date Value Ref Range Status   01/25/2024 337 250 - 450 ug/dL Final     UIBC   Date Value Ref Range Status   01/25/2024 188 130 - 375 ug/dL Final     IRON (TRANSFERRIN) SATURATION   Date Value Ref Range Status   01/25/2024 44 20 - 50 % Final     VITAMIN B 12   Date Value Ref Range Status   10/14/2021 720 180 - 914 pg/mL Final        04/23/2023 PSA 1.68    IMAGING:    11/18/23 US  Gallbladder: FATTY LIVER. NO CHOLELITHIASIS OR BILIARY DILATION     11/18/23 HIDA scan: Gallbladder Ejection Fraction: 70 % (Normal is >35%)     05/13/2023 US  Liver  No focal hepatic mass is identified. The liver again exhibits an overall increase in echogenicity. No evidence of cholelithiasis is seen.      ASSESSMENT:   1.  Hereditary hemochromatosis with combined heterozygosity C282Y/H63D;    Labs on 01/25/24 show normal CBC and iron panel.  Last therapeutic phlebotomy 1-2 years ago, per patient.     PLAN:  All relative external and internal medical records were reviewed including available H&Ps, progress notes, procedure notes, imaging's, laboratories, and pathology    1. Hereditary Hemochromatosis- PER UptoDate  Heterozygosity for HFE C282Y generally produces an unaffected carrier state with similar risk of iron overload as the general population. In some heterozygotes, concomitant liver disease may cause abnormal iron studies.   compound heterozygosity  with C282Y (C282Y/H63D), generally in combination with other causes of liver disease that contribute to iron overload, such as alcohol, hepatitis C virus (HCV) infection, or metabolic dysfunction-associated steatotic liver disease   heterozygosity (C282Y/H63D) is seen in a few percent of individuals with HH, usually with mild, non-progressive iron overload and with other contributory factors such as liver disease from excess alcohol intake, HCV infection, or metabolic dysfunction-associated steatotic liver disease; such other factors need to be searched for and corrected whenever possible   The patient's current iron saturation is less than 45% at 44% today.  His ferritin remains within normal range at 34.  No evidence of iron low at overload at this time.  Returned for follow up with CBC, CMP, iron panel, ferritin 6 months     2. Unintentional Weight loss of 15 lbs over 4 months, ongoing nausea, constipation, and abdominal pain. Patient has a follow up with his PCP 02/04/24 regarding decreased kidney function. Patient may benefit from GI workup as well as CT CAP to rule out any underlying malignancy. Because his kidney function is declining it would have to be without contrast. Patient is going to discuss this with his PCP.     3. RTC 3 months     The patient was given the opportunity to ask questions, and these were answered to their satisfaction. They are welcome to call with any questions or concerns at any point.      On the day of the encounter, I spent a total of 30 minutes on this patient encounter including review of historical information, examination, documentation and post-visit activities.     Romualdo Moose APRN,  FNP-BC 9/9/202509:34

## 2024-01-25 ENCOUNTER — Ambulatory Visit: Payer: Self-pay | Attending: NURSE PRACTITIONER | Admitting: NURSE PRACTITIONER

## 2024-01-25 ENCOUNTER — Ambulatory Visit (INDEPENDENT_AMBULATORY_CARE_PROVIDER_SITE_OTHER)
Admission: RE | Admit: 2024-01-25 | Discharge: 2024-01-25 | Disposition: A | Discharge status: Home / Self Care | Payer: Self-pay | Source: Ambulatory Visit | Attending: NURSE PRACTITIONER | Admitting: NURSE PRACTITIONER

## 2024-01-25 ENCOUNTER — Encounter (INDEPENDENT_AMBULATORY_CARE_PROVIDER_SITE_OTHER): Payer: Self-pay | Admitting: NURSE PRACTITIONER

## 2024-01-25 ENCOUNTER — Other Ambulatory Visit: Payer: Self-pay

## 2024-01-25 DIAGNOSIS — R109 Unspecified abdominal pain: Secondary | ICD-10-CM | POA: Insufficient documentation

## 2024-01-25 DIAGNOSIS — N2889 Other specified disorders of kidney and ureter: Secondary | ICD-10-CM | POA: Insufficient documentation

## 2024-01-25 DIAGNOSIS — R110 Nausea: Secondary | ICD-10-CM | POA: Insufficient documentation

## 2024-01-25 DIAGNOSIS — R5383 Other fatigue: Secondary | ICD-10-CM | POA: Insufficient documentation

## 2024-01-25 DIAGNOSIS — R634 Abnormal weight loss: Secondary | ICD-10-CM | POA: Insufficient documentation

## 2024-01-25 DIAGNOSIS — K5900 Constipation, unspecified: Secondary | ICD-10-CM | POA: Insufficient documentation

## 2024-01-25 LAB — CBC WITH DIFF
BASOPHIL #: 0.1 x10ˆ3/uL (ref 0.00–0.10)
BASOPHIL %: 1 % (ref 0–1)
EOSINOPHIL #: 0.3 x10ˆ3/uL (ref 0.00–0.60)
EOSINOPHIL %: 4 % (ref 1–8)
HCT: 42.6 % (ref 36.7–47.1)
HGB: 15 g/dL (ref 12.5–16.3)
LYMPHOCYTE #: 1.4 x10ˆ3/uL (ref 1.00–3.00)
LYMPHOCYTE %: 16 % (ref 15–43)
MCH: 32.3 pg (ref 23.8–33.4)
MCHC: 35.1 g/dL (ref 32.5–36.3)
MCV: 91.8 fL (ref 73.0–96.2)
MONOCYTE #: 0.7 x10ˆ3/uL (ref 0.30–1.10)
MONOCYTE %: 8 % (ref 6–14)
MPV: 10 fL (ref 7.4–11.4)
NEUTROPHIL #: 6.5 x10ˆ3/uL (ref 1.85–7.84)
NEUTROPHIL %: 72 % (ref 44–74)
PLATELETS: 181 x10ˆ3/uL (ref 140–440)
RBC: 4.64 x10ˆ6/uL (ref 4.06–5.63)
RDW: 13.4 % (ref 12.1–16.2)
WBC: 9.1 x10ˆ3/uL (ref 3.6–10.2)

## 2024-01-25 LAB — FERRITIN: FERRITIN: 34 ng/mL (ref 24–336)

## 2024-01-25 LAB — COMPREHENSIVE METABOLIC PANEL, NON-FASTING
ALBUMIN/GLOBULIN RATIO: 1.4 (ref 0.8–1.4)
ALBUMIN: 4.4 g/dL (ref 3.5–5.7)
ALKALINE PHOSPHATASE: 50 U/L (ref 34–104)
ALT (SGPT): 31 U/L (ref 7–52)
ANION GAP: 9 mmol/L (ref 4–13)
AST (SGOT): 22 U/L (ref 13–39)
BILIRUBIN TOTAL: 0.9 mg/dL (ref 0.3–1.0)
BUN/CREA RATIO: 16 (ref 6–22)
BUN: 25 mg/dL (ref 7–25)
CALCIUM, CORRECTED: 9.9 mg/dL (ref 8.9–10.8)
CALCIUM: 10.2 mg/dL (ref 8.6–10.3)
CHLORIDE: 101 mmol/L (ref 98–107)
CO2 TOTAL: 26 mmol/L (ref 21–31)
CREATININE: 1.53 mg/dL — ABNORMAL HIGH (ref 0.60–1.30)
ESTIMATED GFR: 48 mL/min/1.73mˆ2 — ABNORMAL LOW (ref >59)
GLOBULIN: 3.1 (ref 2.0–3.5)
GLUCOSE: 194 mg/dL — ABNORMAL HIGH (ref 74–109)
OSMOLALITY, CALCULATED: 282 mosm/kg (ref 270–290)
POTASSIUM: 4.5 mmol/L (ref 3.5–5.1)
PROTEIN TOTAL: 7.5 g/dL (ref 6.4–8.9)
SODIUM: 136 mmol/L (ref 136–145)

## 2024-01-25 LAB — IRON TRANSFERRIN AND TIBC
IRON (TRANSFERRIN) SATURATION: 44 % (ref 20–50)
IRON: 149 ug/dL (ref 50–212)
TOTAL IRON BINDING CAPACITY: 337 ug/dL (ref 250–450)
TRANSFERRIN: 241 mg/dL (ref 203–362)
UIBC: 188 ug/dL (ref 130–375)

## 2024-01-31 ENCOUNTER — Other Ambulatory Visit (INDEPENDENT_AMBULATORY_CARE_PROVIDER_SITE_OTHER): Payer: Self-pay | Admitting: Internal Medicine

## 2024-01-31 DIAGNOSIS — I152 Hypertension secondary to endocrine disorders: Secondary | ICD-10-CM

## 2024-02-04 ENCOUNTER — Encounter (INDEPENDENT_AMBULATORY_CARE_PROVIDER_SITE_OTHER): Payer: Self-pay | Admitting: Internal Medicine

## 2024-02-04 ENCOUNTER — Other Ambulatory Visit: Payer: Self-pay

## 2024-02-04 ENCOUNTER — Ambulatory Visit: Payer: Self-pay | Attending: Internal Medicine | Admitting: Internal Medicine

## 2024-02-04 ENCOUNTER — Other Ambulatory Visit (INDEPENDENT_AMBULATORY_CARE_PROVIDER_SITE_OTHER): Payer: Self-pay | Admitting: Internal Medicine

## 2024-02-04 VITALS — BP 128/69 | HR 80 | Ht 67.0 in | Wt 198.0 lb

## 2024-02-04 DIAGNOSIS — Z7984 Long term (current) use of oral hypoglycemic drugs: Secondary | ICD-10-CM

## 2024-02-04 DIAGNOSIS — R630 Anorexia: Secondary | ICD-10-CM | POA: Insufficient documentation

## 2024-02-04 DIAGNOSIS — E1142 Type 2 diabetes mellitus with diabetic polyneuropathy: Secondary | ICD-10-CM | POA: Insufficient documentation

## 2024-02-04 DIAGNOSIS — I152 Hypertension secondary to endocrine disorders: Secondary | ICD-10-CM | POA: Insufficient documentation

## 2024-02-04 DIAGNOSIS — E782 Mixed hyperlipidemia: Secondary | ICD-10-CM

## 2024-02-04 DIAGNOSIS — I10 Essential (primary) hypertension: Secondary | ICD-10-CM

## 2024-02-04 DIAGNOSIS — R5382 Chronic fatigue, unspecified: Secondary | ICD-10-CM | POA: Insufficient documentation

## 2024-02-04 DIAGNOSIS — J9801 Acute bronchospasm: Secondary | ICD-10-CM | POA: Insufficient documentation

## 2024-02-04 DIAGNOSIS — R059 Cough, unspecified: Secondary | ICD-10-CM | POA: Insufficient documentation

## 2024-02-04 DIAGNOSIS — E1159 Type 2 diabetes mellitus with other circulatory complications: Secondary | ICD-10-CM | POA: Insufficient documentation

## 2024-02-04 DIAGNOSIS — K56600 Partial intestinal obstruction, unspecified as to cause: Secondary | ICD-10-CM | POA: Insufficient documentation

## 2024-02-04 DIAGNOSIS — E1169 Type 2 diabetes mellitus with other specified complication: Secondary | ICD-10-CM | POA: Insufficient documentation

## 2024-02-04 DIAGNOSIS — N401 Enlarged prostate with lower urinary tract symptoms: Secondary | ICD-10-CM | POA: Insufficient documentation

## 2024-02-04 DIAGNOSIS — R338 Other retention of urine: Secondary | ICD-10-CM | POA: Insufficient documentation

## 2024-02-04 DIAGNOSIS — R194 Change in bowel habit: Secondary | ICD-10-CM | POA: Insufficient documentation

## 2024-02-04 DIAGNOSIS — F331 Major depressive disorder, recurrent, moderate: Secondary | ICD-10-CM | POA: Insufficient documentation

## 2024-02-04 DIAGNOSIS — N1831 Chronic kidney disease, stage 3a: Secondary | ICD-10-CM | POA: Insufficient documentation

## 2024-02-04 NOTE — Progress Notes (Signed)
 INTERNAL MEDICINE, [facility]  [address]  [place] [address]  Operated by [facility]     Name: Steven Henderson MRN:  [id number]   Date: 02/04/2024 Age: 73 y.o.       Chief Complaint:    Chief Complaint   Patient presents with    Follow Up 3 Months     Follow up on labs.  Said he just don't feel good, feels run down, this started about 2 weeks ago.        History of Present Illness  Steven Henderson is a 73 year old male with chronic kidney disease who presents with fatigue and weight loss.    He has been experiencing persistent fatigue and a lack of strength over the past couple of weeks, with a sudden onset. He also reports worsening headaches throughout the day. No chest pain, chest pressure, or fever. He sometimes experiences shortness of breath with exertion.    He has been losing weight, dropping from around 215 pounds to below 200 pounds, despite maintaining his appetite. A healthcare provider expressed concern about his weight loss and suggested a CT scan of the abdomen and pelvis.    His past medical history includes chronic kidney disease, with a recent decline in kidney function. His GFR has decreased from 70 last October to 48 currently. He is aware of his hemochromatosis condition. He has been on metformin  and is also taking Telmisartan  and hydrochlorothiazide .    He experiences nausea and occasional vomiting, which has improved with medication. He also reports a persistent cough and back pain that worsens with standing. Additionally, he experiences pain in the bottom of his feet when standing for long periods, particularly on concrete surfaces.    He has not been tested for sleep apnea and does not experience morning headaches. He has not received a flu shot this year.       Past Medical History:  Past Medical History:   Diagnosis Date    Anxiety state     BPH (benign prostatic hyperplasia)     Depression, acute     Diabetes mellitus, type 2     Elevated PSA      Generalized OA     Hemochromatosis     Hyperlipidemia     Hypertension     Hypomagnesemia     Insomnia     Syncope     Testicular hypofunction     Vitamin D deficiency          Past Surgical History:   Procedure Laterality Date    CYSTOSCOPY  06/23/2017    FINGER SURGERY Left     RING FINGER    HAND SURGERY Right     HX APPENDECTOMY        Current Outpatient Medications   Medication Sig    amLODIPine  (NORVASC ) 10 mg Oral Tablet TAKE (1) TABLET DAILY    aspirin  (ECOTRIN) 81 mg Oral Tablet, Delayed Release (E.C.) Take 1 Tablet (81 mg total) by mouth Daily    finasteride  (PROSCAR ) 5 mg Oral Tablet Take 1 Tablet (5 mg total) by mouth Daily    glimepiride  (AMARYL ) 4 mg Oral Tablet Take 1 Tablet (4 mg total) by mouth Twice daily    hydroCHLOROthiazide  (HYDRODIURIL ) 50 mg Oral Tablet Take 1 Tablet (50 mg total) by mouth Daily    ondansetron  (ZOFRAN  ODT) 8 mg Oral Tablet, Rapid Dissolve Place 1 Tablet (8 mg total) under the tongue Every 8 hours as  needed for Nausea/Vomiting    rosuvastatin  (CRESTOR ) 10 mg Oral Tablet TAKE 1 TABLET IN THE EVENING    tamsulosin  (FLOMAX ) 0.4 mg Oral Capsule TAKE (1) CAPSULE DAILY IN THE EVENING.    telmisartan  (MICARDIS ) 80 mg Oral Tablet TAKE (1) TABLET DAILY     Allergies[1]    Family History:  Family Medical History:       Problem Relation (Age of Onset)    Liver Cancer Father    Stroke Mother              Social History:   Tobacco Use History[2]  Social History     Substance and Sexual Activity   Alcohol Use Not Currently     Social History     Occupational History    Not on file       Review of Systems:  Review of systems as discussed in HPI      Problem List:  Problem List[3]    Physical Examination:  BP 128/69 (Site: Left Arm, Patient Position: Sitting, Cuff Size: Adult)   Pulse 80   Ht 1.702 m (5' 7)   Wt 89.8 kg (198 lb)   SpO2 98%   BMI 31.01 kg/m       Physical Exam  MEASUREMENTS: Weight- below 200.  CONSTITUTIONAL: General- He is not in acute distress.  Appearance- Normal  appearance.  HENT: Head- Normocephalic.  Right Ear- Tympanic membrane normal.  Left Ear- Tympanic membrane normal.  Nose- Nose normal.  Mouth-Throat-  Mouth-Mucous membranes are moist.  EYES: General- No scleral icterus. Extraocular Movements- Extraocular movements intact. Conjunctiva-sclera-Conjunctivae normal.  Pupils- Pupils are equal, round, and reactive to light.  NECK: Vascular-No carotid bruit.  CARDIOVASCULAR: Rate and Rhythm- Normal rate and regular rhythm.  Pulses- Normal pulses.   Heart sounds- Normal heart sounds.  PULMONARY: Effort- Pulmonary effort is normal.   Breath sounds- Normal breath sounds. No wheezing, rhonchi or rales.  ABDOMINAL: General-Abdomen is flat. Bowel sounds are normal.   Tenderness-There is no abdominal tenderness. There is no guarding.  MUSCULOSKELETAL: General- No swelling.  Normal range of motion.  Cervical back-Normal range of motion.  No tenderness.  Right lower leg-No edema.   Left lower leg-No edema.  SKIN: General- Skin is warm.   Capillary Refill- Capillary refill takes less than 2 seconds.  NEUROLOGICAL: General- No focal deficit present.   Mental Status- He is alert and oriented to person, place, and time.   Cranial Nerves- No cranial nerve deficit.          Health Maintenance:  Health Maintenance   Topic Date Due    Influenza Vaccine (1) 03/05/2024 (Originally 01/17/2024)    Diabetic Retinal Exam  04/16/2024    Diabetic A1C  07/15/2024    Medicare Annual Wellness Visit  09/07/2024    Diabetic Kidney Health Microalb/Cr Ratio  01/13/2025    Diabetic Kidney Health eGFR  01/24/2025    Colonoscopy  11/24/2032    Pneumococcal Vaccination, Age 42+  Completed    Adult Tdap-Td  Discontinued    Hepatitis C screening  Discontinued    Shingles Vaccine  Discontinued    Covid-19 Vaccine  Discontinued    Prostate Cancer Screening  Discontinued        Results  LABS  Hemoglobin A1c: 6.7%  GFR: 48 mL/min/1.73 m  Hemoglobin: 15 g/dL         FIB-4 Calculation: 1.57 at 01/25/2024  8:23  AM  Calculated from:  SGOT/AST: 22 U/L at 01/25/2024  8:23 AM  SGPT/ALT: 31 U/L at 01/25/2024  8:23 AM  Platelets: 181 x10^3/uL at 01/25/2024  8:23 AM  Age: 72 years     Assessment/Plan:      ICD-10-CM    1. DM type 2 with diabetic mixed hyperlipidemia (CMS HCC)  (CMS HCC)  E11.69 COMPREHENSIVE METABOLIC PANEL, NON-FASTING    E78.2 LYME ANTIBODY PANEL WITH REFLEX     LIPASE      2. Hypertension associated with type 2 diabetes mellitus (CMS HCC)  (CMS HCC)  E11.59 COMPREHENSIVE METABOLIC PANEL, NON-FASTING    I15.2 LYME ANTIBODY PANEL WITH REFLEX     LIPASE      3. Partial small bowel obstruction (CMS HCC)  K56.600       4. Type 2 diabetes mellitus with diabetic polyneuropathy, without long-term current use of insulin  (CMS HCC)  E11.42       5. Benign prostatic hyperplasia with urinary retention  N40.1     R33.8       6. 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 Chronic fatigue  R53.82       8. Hereditary hemochromatosis (CMS HCC)  E83.110       9. Stage 3a chronic kidney disease (CMS HCC)  N18.31 CT ABDOMEN PELVIS WO IV CONTRAST      10. Anorexia  R63.0 CT ABDOMEN PELVIS WO IV CONTRAST     COMPREHENSIVE METABOLIC PANEL, NON-FASTING     LYME ANTIBODY PANEL WITH REFLEX     LIPASE      11. Bowel habit changes  R19.4 CT ABDOMEN PELVIS WO IV CONTRAST     COMPREHENSIVE METABOLIC PANEL, NON-FASTING     LYME ANTIBODY PANEL WITH REFLEX     LIPASE      12. Cough in adult patient  R05.9 CT CHEST WO IV CONTRAST      13. Acute bronchospasm  J98.01 CT CHEST WO IV CONTRAST         Orders Placed This Encounter    CT ABDOMEN PELVIS WO IV CONTRAST    CT CHEST WO IV CONTRAST    COMPREHENSIVE METABOLIC PANEL, NON-FASTING    LYME ANTIBODY PANEL WITH REFLEX    LIPASE     Medications Discontinued During This Encounter   Medication Reason    MetFORMIN  (GLUCOPHAGE ) 1,000 mg Oral Tablet Medication Reconciliation              Current diabetes medications:   Active Diabetes Meds   Medication Sig    glimepiride   (AMARYL ) 4 mg Oral Tablet Take 1 Tablet (4 mg total) by mouth Twice daily            Assessment & Plan  Chronic kidney disease stage 3  Chronic kidney disease stage 3 with recent decline in kidney function. GFR decreased from 60 to 48. Symptoms include fatigue, weight loss, and headaches, potentially related to kidney function. No signs of acute kidney failure. Current medications, Telmisartan  and Hydrochlorothiazide , may be contributing to decreased kidney function.  - Stop Metformin  temporarily due to kidney function concerns.  - Stop Telmisartan  and Hydrochlorothiazide  to improve kidney function.  - Order CT scan of the abdomen and pelvis with oral contrast only to evaluate kidney and abdominal issues.  - Recheck kidney function tests before CT scan.    Chronic fatigue and unintentional weight loss  Chronic fatigue and unintentional weight loss potentially related to kidney function decline. Weight decreased from 215 lbs  to below 200 lbs. Appetite remains stable. Differential includes uremia due to CKD.  - Order CT scan of the abdomen and pelvis with oral contrast only to evaluate potential causes of weight loss.    Constipation  Constipation with recent changes in bowel habits. Bowel movements less frequent than usual.  - CT scan of the abdomen and pelvis to evaluate potential causes of constipation.    Type 2 diabetes mellitus with hypertension  Type 2 diabetes mellitus with hypertension. Hemoglobin A1c improved from 7.0 to 6.7. Metformin  temporarily stopped due to kidney function concerns.    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 Pain exacerbated by standing for long periods. Previous imaging showed significant arthritis in the lumbar region.  - CT scan will include evaluation of the lumbar region.    Morning nausea  Morning nausea improved with ondansetron . Occasional gagging and vomiting episodes.    Chronic cough under evaluation  Chronic  cough persistent throughout the day. Not improved with current GERD medication.  - CT scan of the chest to evaluate chronic cough.    Bilateral plantar foot pain, likely plantar fasciitis  Bilateral plantar foot pain, likely due to plantar fasciitis. Pain exacerbated by standing and walking on hard surfaces. No pain upon waking.  - Recommend wearing Crocs or similar cushioned footwear consistently for a couple of weeks to assess improvement.       Follow up:  Return in about 6 weeks (around 03/17/2024).    This note was partially created using voice recognition software and is inherently subject to errors including those of syntax and sound alike  substitutions which may escape proof reading.  In such instances, original meaning may be extrapolated by contextual derivation.    This note was created with assistance from [employer] via capture of conversational audio. Consent was obtained from the patient and all parties present prior to recording.      Krystal DELENA Sharps, DO  02/04/2024, 09:39         [1] No Known Allergies  [2]   Social History  Tobacco Use   Smoking Status Never    Passive exposure: Never   Smokeless Tobacco Never   [3]   Patient Active Problem List  Diagnosis    Anxiety state    BPH (benign prostatic hyperplasia)    Diabetes mellitus, type 2    Elevated PSA    Generalized OA    Hypomagnesemia    Insomnia    Chronic bilateral thoracic back pain    Hypertension associated with type 2 diabetes mellitus (CMS HCC)  (CMS HCC)    Fatigue    Hereditary hemochromatosis (CMS HCC)    Dupuytren's contracture of both hands    Ileus (CMS HCC)    Chronic cough    Partial small bowel obstruction (CMS HCC)    Dehydration    Colon cancer screening    Scapulothoracic syndrome    Chronic right shoulder pain    DM type 2 with diabetic mixed hyperlipidemia (CMS HCC)  (CMS HCC)    Syncope    Shoulder tendonitis, right    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

## 2024-02-15 ENCOUNTER — Other Ambulatory Visit (INDEPENDENT_AMBULATORY_CARE_PROVIDER_SITE_OTHER): Payer: Self-pay | Admitting: Internal Medicine

## 2024-02-15 DIAGNOSIS — N1831 Chronic kidney disease, stage 3a: Secondary | ICD-10-CM

## 2024-02-15 DIAGNOSIS — R630 Anorexia: Secondary | ICD-10-CM

## 2024-02-15 DIAGNOSIS — R194 Change in bowel habit: Secondary | ICD-10-CM

## 2024-02-18 ENCOUNTER — Telehealth (INDEPENDENT_AMBULATORY_CARE_PROVIDER_SITE_OTHER): Payer: Self-pay | Admitting: Internal Medicine

## 2024-02-18 NOTE — Telephone Encounter (Signed)
Ct results

## 2024-02-18 NOTE — Telephone Encounter (Signed)
 I was going to address this on his next office visit however yes we can order a PSA

## 2024-02-21 NOTE — Telephone Encounter (Signed)
Called no answer ks,lpn

## 2024-02-25 ENCOUNTER — Ambulatory Visit: Attending: Internal Medicine

## 2024-02-25 ENCOUNTER — Other Ambulatory Visit: Payer: Self-pay

## 2024-02-25 DIAGNOSIS — R630 Anorexia: Secondary | ICD-10-CM | POA: Insufficient documentation

## 2024-02-25 DIAGNOSIS — E1159 Type 2 diabetes mellitus with other circulatory complications: Secondary | ICD-10-CM | POA: Insufficient documentation

## 2024-02-25 DIAGNOSIS — R194 Change in bowel habit: Secondary | ICD-10-CM | POA: Insufficient documentation

## 2024-02-25 DIAGNOSIS — E1169 Type 2 diabetes mellitus with other specified complication: Secondary | ICD-10-CM | POA: Insufficient documentation

## 2024-02-25 DIAGNOSIS — I152 Hypertension secondary to endocrine disorders: Secondary | ICD-10-CM | POA: Insufficient documentation

## 2024-02-25 DIAGNOSIS — E782 Mixed hyperlipidemia: Secondary | ICD-10-CM | POA: Insufficient documentation

## 2024-02-25 LAB — COMPREHENSIVE METABOLIC PANEL, NON-FASTING
ALBUMIN/GLOBULIN RATIO: 1.3 (ref 0.8–1.4)
ALBUMIN: 4.1 g/dL (ref 3.5–5.7)
ALKALINE PHOSPHATASE: 46 U/L (ref 34–104)
ALT (SGPT): 31 U/L (ref 7–52)
ANION GAP: 5 mmol/L (ref 4–13)
AST (SGOT): 24 U/L (ref 13–39)
BILIRUBIN TOTAL: 0.6 mg/dL (ref 0.3–1.0)
BUN/CREA RATIO: 22 (ref 6–22)
BUN: 24 mg/dL (ref 7–25)
CALCIUM, CORRECTED: 9.8 mg/dL (ref 8.9–10.8)
CALCIUM: 9.9 mg/dL (ref 8.6–10.3)
CHLORIDE: 105 mmol/L (ref 98–107)
CO2 TOTAL: 25 mmol/L (ref 21–31)
CREATININE: 1.11 mg/dL (ref 0.60–1.30)
ESTIMATED GFR: 71 mL/min/1.73mˆ2 (ref >59)
GLOBULIN: 3.1 (ref 2.0–3.5)
GLUCOSE: 261 mg/dL — ABNORMAL HIGH (ref 74–109)
OSMOLALITY, CALCULATED: 283 mosm/kg (ref 270–290)
POTASSIUM: 4.3 mmol/L (ref 3.5–5.1)
PROTEIN TOTAL: 7.2 g/dL (ref 6.4–8.9)
SODIUM: 135 mmol/L — ABNORMAL LOW (ref 136–145)

## 2024-02-25 LAB — LIPASE: LIPASE: 48 U/L (ref 11–82)

## 2024-02-25 NOTE — Progress Notes (Signed)
 labs

## 2024-02-26 LAB — LYME ANTIBODY PANEL WITH REFLEX: LYME ANTIBODY TOTAL (Screen): NEGATIVE

## 2024-03-01 ENCOUNTER — Telehealth (INDEPENDENT_AMBULATORY_CARE_PROVIDER_SITE_OTHER): Payer: Self-pay | Admitting: Internal Medicine

## 2024-03-01 NOTE — Telephone Encounter (Signed)
Wife returned your call

## 2024-03-01 NOTE — Telephone Encounter (Signed)
 Left message to return call ks,lpn

## 2024-03-02 ENCOUNTER — Telehealth (INDEPENDENT_AMBULATORY_CARE_PROVIDER_SITE_OTHER): Payer: Self-pay | Admitting: Internal Medicine

## 2024-03-02 ENCOUNTER — Other Ambulatory Visit (INDEPENDENT_AMBULATORY_CARE_PROVIDER_SITE_OTHER): Payer: Self-pay | Admitting: Internal Medicine

## 2024-03-02 DIAGNOSIS — N401 Enlarged prostate with lower urinary tract symptoms: Secondary | ICD-10-CM

## 2024-03-02 NOTE — Telephone Encounter (Signed)
 Just call pt back and not wife

## 2024-03-02 NOTE — Telephone Encounter (Signed)
 Patient notified of results and per Dr claudene to have PSA ordered will go to [facility] to have it done ks,lpn

## 2024-03-08 ENCOUNTER — Other Ambulatory Visit: Payer: Self-pay

## 2024-03-08 ENCOUNTER — Ambulatory Visit: Attending: Internal Medicine

## 2024-03-08 DIAGNOSIS — N401 Enlarged prostate with lower urinary tract symptoms: Secondary | ICD-10-CM | POA: Insufficient documentation

## 2024-03-08 DIAGNOSIS — R338 Other retention of urine: Secondary | ICD-10-CM | POA: Insufficient documentation

## 2024-03-08 LAB — PSA, DIAGNOSTIC: PSA: 1.74 ng/mL (ref <=4.00)

## 2024-03-15 ENCOUNTER — Other Ambulatory Visit (INDEPENDENT_AMBULATORY_CARE_PROVIDER_SITE_OTHER): Payer: Self-pay | Admitting: Internal Medicine

## 2024-03-17 ENCOUNTER — Ambulatory Visit: Payer: Self-pay | Admitting: Internal Medicine

## 2024-03-17 ENCOUNTER — Other Ambulatory Visit: Payer: Self-pay

## 2024-03-17 ENCOUNTER — Encounter (INDEPENDENT_AMBULATORY_CARE_PROVIDER_SITE_OTHER): Payer: Self-pay | Admitting: Internal Medicine

## 2024-03-17 VITALS — BP 122/77 | HR 84 | Ht 67.0 in | Wt 198.6 lb

## 2024-03-17 DIAGNOSIS — E1169 Type 2 diabetes mellitus with other specified complication: Secondary | ICD-10-CM

## 2024-03-17 DIAGNOSIS — Z23 Encounter for immunization: Secondary | ICD-10-CM

## 2024-03-17 DIAGNOSIS — E1142 Type 2 diabetes mellitus with diabetic polyneuropathy: Secondary | ICD-10-CM

## 2024-03-17 DIAGNOSIS — I152 Hypertension secondary to endocrine disorders: Secondary | ICD-10-CM

## 2024-03-17 DIAGNOSIS — G729 Myopathy, unspecified: Secondary | ICD-10-CM

## 2024-03-17 NOTE — Progress Notes (Signed)
 [facility], [facility]  [address]  [place] [address]  Operated by [facility]     Name: Steven Henderson MRN:  [id number]   Date: 03/17/2024 Age: 73 y.o.       Chief Complaint:    Chief Complaint   Patient presents with    Follow-up After Testing     Follow-up on labs and CT scan.        History of Present Illness  Steven Henderson is a 73 year old male who presents with leg pain and cramping, particularly after standing for long periods.    He experiences significant leg pain and cramping, especially after standing for extended periods at his job at the [employer]. The pain subsides when he sits down. He experiences cramping at night but denies numbness, though he does report tingling in the legs. He does not experience cramping while standing, only at night.    He has a history of kidney stones, with a recent CT scan showing stones in the left kidney measuring two, three, and four millimeters. He also has small cysts in both kidneys, which are fluid-filled and not causing symptoms. Recent blood work showed a temporary increase in creatinine levels, which returned to normal after discontinuing a medication. His A1c was 6.7, and his PSA levels were normal.    He is currently taking telmisartan  and hydrochlorothiazide , which can affect creatinine levels.    No symptoms of Lyme disease, and his recent Lyme titer was negative. He also has diverticular disease, which is currently asymptomatic.       Past Medical History:  Past Medical History:   Diagnosis Date    Anxiety state     BPH (benign prostatic hyperplasia)     Depression, acute     Diabetes mellitus, type 2     Elevated PSA     Generalized OA     Hemochromatosis     Hyperlipidemia     Hypertension     Hypomagnesemia     Insomnia     Syncope     Testicular hypofunction     Vitamin D deficiency          Past Surgical History:   Procedure Laterality Date    CYSTOSCOPY  06/23/2017    FINGER SURGERY Left     RING  FINGER    HAND SURGERY Right     HX APPENDECTOMY        Current Outpatient Medications   Medication Sig    amLODIPine  (NORVASC ) 10 mg Oral Tablet TAKE (1) TABLET DAILY    aspirin  (ECOTRIN) 81 mg Oral Tablet, Delayed Release (E.C.) Take 1 Tablet (81 mg total) by mouth Daily    finasteride  (PROSCAR ) 5 mg Oral Tablet Take 1 Tablet (5 mg total) by mouth Daily    glimepiride  (AMARYL ) 4 mg Oral Tablet Take 1 Tablet (4 mg total) by mouth Twice daily    hydroCHLOROthiazide  (HYDRODIURIL ) 50 mg Oral Tablet Take 1 Tablet (50 mg total) by mouth Daily    ondansetron  (ZOFRAN  ODT) 8 mg Oral Tablet, Rapid Dissolve Place 1 Tablet (8 mg total) under the tongue Every 8 hours as needed for Nausea/Vomiting    rosuvastatin  (CRESTOR ) 10 mg Oral Tablet TAKE 1 TABLET IN THE EVENING    tamsulosin  (FLOMAX ) 0.4 mg Oral Capsule TAKE (1) CAPSULE DAILY IN THE EVENING.    telmisartan  (MICARDIS ) 80 mg Oral Tablet TAKE (1) TABLET DAILY     Allergies[1]  Family History:  Family Medical History:       Problem Relation (Age of Onset)    Liver Cancer Father    Stroke Mother              Social History:   Tobacco Use History[2]  Social History     Substance and Sexual Activity   Alcohol Use Not Currently     Social History     Occupational History    Not on file       Review of Systems:  Review of systems as discussed in HPI      Problem List:  Problem List[3]    Physical Examination:  BP 122/77 (Site: Left Arm, Patient Position: Sitting, Cuff Size: Adult Large)   Pulse 84   Ht 1.702 m (5' 7)   Wt 90.1 kg (198 lb 9.6 oz)   SpO2 98%   BMI 31.11 kg/m       Physical Exam  CONSTITUTIONAL: He is not in acute distress. Normal appearance.  HENT: Head is normocephalic. Tympanic membranes normal bilaterally. Nose normal. Mucous membranes are moist.  EYES: No scleral icterus. Extraocular movements intact. Conjunctivae normal. Pupils are equal, round, and reactive to light.  NECK: No carotid bruit.  CARDIOVASCULAR: Normal rate and regular rhythm. Normal  pulses. Normal heart sounds.  PULMONARY: Pulmonary effort is normal. Normal breath sounds. No wheezing, rhonchi, or rales.  ABDOMINAL: Abdomen is flat. Bowel sounds are normal. No abdominal tenderness or guarding.  MUSCULOSKELETAL: No swelling. Normal range of motion. Motor strength normal in lower extremities. No tenderness in cervical back. No edema in lower legs.  SKIN: Skin is warm. Capillary refill takes less than 2 seconds.  NEUROLOGICAL: No focal deficit present. He is alert and oriented to person, place, and time. No cranial nerve deficit.          Health Maintenance:  Health Maintenance   Topic Date Due    DTaP-Tdap-Td Series (1 - Tdap) Never done    Influenza Vaccine (1) Never done    Diabetic Retinal Exam  04/16/2024    Diabetic A1C  07/15/2024    Medicare Annual Wellness Visit  09/07/2024    Diabetic Kidney Health Microalb/Cr Ratio  01/13/2025    Diabetic Kidney Health eGFR  02/24/2025    Colonoscopy  11/24/2032    Pneumococcal Vaccination, Age 78+  Completed    Hepatitis C screening  Discontinued    Shingles Vaccine  Discontinued    Covid-19 Vaccine (Shared decision making)  Discontinued    Prostate Cancer Screening  Discontinued        Results  LABS  Creatinine: 1.53 (01/25/2024)  A1c: 6.7    RADIOLOGY  CT scan abdomen and pelvis: No masses in abdomen, stones in left kidney (2mm, 3mm, 4mm), small cysts in both kidneys, normal liver, pancreas, spleen, kidneys, adrenal glands, diverticular disease, moderate prostate enlargement         FIB-4 Calculation: 1.57 at 01/25/2024  8:23 AM  Calculated from:  SGOT/AST: 22 U/L at 01/25/2024  8:23 AM  SGPT/ALT: 31 U/L at 01/25/2024  8:23 AM  Platelets: 181 x10^3/uL at 01/25/2024  8:23 AM  Age: 56 years     Assessment/Plan:      ICD-10-CM    1. Hypertension associated with type 2 diabetes mellitus (CMS HCC)  (CMS HCC)  E11.59     I15.2       2. DM type 2 with diabetic mixed hyperlipidemia (CMS HCC)  (CMS HCC)  E11.69  E78.2       3. Hereditary hemochromatosis (CMS HCC)   E83.110       4. Type 2 diabetes mellitus with diabetic polyneuropathy, without long-term current use of insulin  (CMS HCC)  E11.42       5. Myopathy  G72.9       6. Need for influenza vaccination  Z23          No orders of the defined types were placed in this encounter.    There are no discontinued medications.       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 Symptoms likely due to statin-induced myopathy from Crestor  (rosuvastatin ). Dehydration also considered a contributing factor to cramps. Differential diagnosis of polymyalgia rheumatica considered, requiring a sed rate test for confirmation.  - Discontinue Crestor  (rosuvastatin ) to assess if symptoms improve.  - Encourage increased hydration to prevent dehydration-related cramps.  - Consider using pickle juice or mustard for cramp relief.  - Order sed rate test to rule out polymyalgia rheumatica.    Chronic kidney disease stage 3  Kidney function tests showed improvement, with creatinine levels returning to normal after a previous increase. Increase possibly due to medication effects, particularly telmisartan  and hydrochlorothiazide .    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 Stones not causing symptoms and considered incidental findings. Cysts are fluid-filled and not concerning for polycystic kidney disease.    Type 2 diabetes mellitus with hypertension  A1c is 6.7, indicating good control of diabetes. Hydrochlorothiazide  and telmisartan , used for blood pressure management, may have contributed to changes in kidney function and  dehydration.    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 Condition not causing symptoms.    Benign prostatic hyperplasia  Moderately enlarged prostate, common finding with aging. PSA levels stable, no signs of malignancy. Monitoring PSA trends for any significant changes.       Follow up:  No follow-ups on file.    This note was partially created using voice recognition software and is inherently subject to errors including those of syntax and sound alike  substitutions which may escape proof reading.  In such instances, original meaning may be extrapolated by contextual derivation.    This note was created with assistance from [employer] via capture of conversational audio. Consent was obtained from the patient and all parties present prior to recording.      Krystal DELENA Sharps, DO  03/17/2024, 08:39           [1] No Known Allergies  [2]   Social History  Tobacco Use   Smoking Status Never    Passive exposure: Never   Smokeless Tobacco Never   [3]   Patient Active Problem List  Diagnosis    Anxiety state    BPH (benign prostatic hyperplasia)    Diabetes mellitus, type 2    Elevated PSA  Generalized OA    Hypomagnesemia    Insomnia    Chronic bilateral thoracic back pain    Hypertension associated with type 2 diabetes mellitus (CMS HCC)  (CMS HCC)    Fatigue    Hereditary hemochromatosis (CMS HCC)    Dupuytren's contracture of both hands    Ileus (CMS HCC)    Chronic cough    Partial small bowel obstruction (CMS HCC)    Dehydration    Colon cancer screening    Scapulothoracic syndrome    Chronic right shoulder pain    DM type 2 with diabetic mixed hyperlipidemia (CMS HCC)  (CMS HCC)    Syncope    Shoulder tendonitis, right    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

## 2024-03-20 ENCOUNTER — Ambulatory Visit (HOSPITAL_COMMUNITY): Payer: Self-pay

## 2024-03-21 ENCOUNTER — Ambulatory Visit (HOSPITAL_COMMUNITY): Payer: Self-pay

## 2024-03-24 ENCOUNTER — Other Ambulatory Visit: Payer: Self-pay

## 2024-03-24 ENCOUNTER — Ambulatory Visit: Attending: Internal Medicine

## 2024-03-24 DIAGNOSIS — G729 Myopathy, unspecified: Secondary | ICD-10-CM | POA: Insufficient documentation

## 2024-03-24 DIAGNOSIS — E1169 Type 2 diabetes mellitus with other specified complication: Secondary | ICD-10-CM | POA: Insufficient documentation

## 2024-03-24 DIAGNOSIS — E782 Mixed hyperlipidemia: Secondary | ICD-10-CM | POA: Insufficient documentation

## 2024-03-24 LAB — BASIC METABOLIC PANEL
ANION GAP: 5 mmol/L (ref 4–13)
BUN/CREA RATIO: 18 (ref 6–22)
BUN: 20 mg/dL (ref 7–25)
CALCIUM: 10 mg/dL (ref 8.6–10.3)
CHLORIDE: 107 mmol/L (ref 98–107)
CO2 TOTAL: 27 mmol/L (ref 21–31)
CREATININE: 1.13 mg/dL (ref 0.60–1.30)
ESTIMATED GFR: 69 mL/min/1.73mˆ2 (ref >59)
GLUCOSE: 220 mg/dL — ABNORMAL HIGH (ref 74–109)
OSMOLALITY, CALCULATED: 287 mosm/kg (ref 270–290)
POTASSIUM: 4.6 mmol/L (ref 3.5–5.1)
SODIUM: 139 mmol/L (ref 136–145)

## 2024-03-24 LAB — SEDIMENTATION RATE: ERYTHROCYTE SEDIMENTATION RATE (ESR): 12 mm/h (ref <20)

## 2024-04-10 ENCOUNTER — Other Ambulatory Visit (INDEPENDENT_AMBULATORY_CARE_PROVIDER_SITE_OTHER): Payer: Self-pay | Admitting: Internal Medicine

## 2024-04-10 DIAGNOSIS — E1159 Type 2 diabetes mellitus with other circulatory complications: Secondary | ICD-10-CM

## 2024-04-10 DIAGNOSIS — I152 Hypertension secondary to endocrine disorders: Secondary | ICD-10-CM

## 2024-05-02 ENCOUNTER — Encounter (INDEPENDENT_AMBULATORY_CARE_PROVIDER_SITE_OTHER): Payer: Self-pay | Admitting: NURSE PRACTITIONER

## 2024-05-02 ENCOUNTER — Ambulatory Visit: Payer: Self-pay | Attending: NURSE PRACTITIONER | Admitting: NURSE PRACTITIONER

## 2024-05-02 ENCOUNTER — Other Ambulatory Visit: Payer: Self-pay

## 2024-05-02 ENCOUNTER — Other Ambulatory Visit (INDEPENDENT_AMBULATORY_CARE_PROVIDER_SITE_OTHER): Payer: Self-pay | Admitting: NURSE PRACTITIONER

## 2024-05-02 ENCOUNTER — Ambulatory Visit (INDEPENDENT_AMBULATORY_CARE_PROVIDER_SITE_OTHER): Admission: RE | Admit: 2024-05-02 | Payer: Self-pay

## 2024-05-02 LAB — IRON TRANSFERRIN AND TIBC
IRON (TRANSFERRIN) SATURATION: 40 % (ref 20–50)
IRON: 126 ug/dL (ref 50–212)
TOTAL IRON BINDING CAPACITY: 312 ug/dL (ref 250–450)
TRANSFERRIN: 223 mg/dL (ref 203–362)
UIBC: 186 ug/dL (ref 130–375)

## 2024-05-02 LAB — CBC WITH DIFF
BASOPHIL #: 0.1 x10ˆ3/uL (ref 0.00–0.10)
BASOPHIL %: 1 % (ref 0–1)
EOSINOPHIL #: 0.1 x10ˆ3/uL (ref 0.00–0.60)
EOSINOPHIL %: 1 % (ref 1–8)
HCT: 42.4 % (ref 36.7–47.1)
HGB: 14.9 g/dL (ref 12.5–16.3)
LYMPHOCYTE #: 1.1 x10ˆ3/uL (ref 1.00–3.00)
LYMPHOCYTE %: 19 % (ref 15–43)
MCH: 31.9 pg (ref 23.8–33.4)
MCHC: 35.3 g/dL (ref 32.5–36.3)
MCV: 90.6 fL (ref 73.0–96.2)
MONOCYTE #: 0.5 x10ˆ3/uL (ref 0.30–1.10)
MONOCYTE %: 10 % (ref 6–14)
MPV: 10.6 fL (ref 7.4–11.4)
NEUTROPHIL #: 4 x10ˆ3/uL (ref 1.85–7.84)
NEUTROPHIL %: 69 % (ref 44–74)
PLATELETS: 153 x10ˆ3/uL (ref 140–440)
RBC: 4.68 x10ˆ6/uL (ref 4.06–5.63)
RDW: 12.7 % (ref 12.1–16.2)
WBC: 5.8 x10ˆ3/uL (ref 3.6–10.2)

## 2024-05-02 LAB — COMPREHENSIVE METABOLIC PANEL, NON-FASTING
ALBUMIN/GLOBULIN RATIO: 1.4 (ref 0.8–1.4)
ALBUMIN: 4.1 g/dL (ref 3.5–5.7)
ALKALINE PHOSPHATASE: 56 U/L (ref 34–104)
ALT (SGPT): 33 U/L (ref 7–52)
ANION GAP: 5 mmol/L (ref 4–13)
AST (SGOT): 20 U/L (ref 13–39)
BILIRUBIN TOTAL: 0.6 mg/dL (ref 0.3–1.0)
BUN/CREA RATIO: 17 (ref 6–22)
BUN: 22 mg/dL (ref 7–25)
CALCIUM, CORRECTED: 9.9 mg/dL (ref 8.9–10.8)
CALCIUM: 10 mg/dL (ref 8.6–10.3)
CHLORIDE: 100 mmol/L (ref 98–107)
CO2 TOTAL: 28 mmol/L (ref 21–31)
CREATININE: 1.28 mg/dL (ref 0.60–1.30)
ESTIMATED GFR: 59 mL/min/1.73mˆ2 — ABNORMAL LOW (ref >59)
GLOBULIN: 3 (ref 2.0–3.5)
GLUCOSE: 383 mg/dL — ABNORMAL HIGH (ref 74–109)
OSMOLALITY, CALCULATED: 286 mosm/kg (ref 270–290)
POTASSIUM: 4.8 mmol/L (ref 3.5–5.1)
PROTEIN TOTAL: 7.1 g/dL (ref 6.4–8.9)
SODIUM: 133 mmol/L — ABNORMAL LOW (ref 136–145)

## 2024-05-02 LAB — FERRITIN: FERRITIN: 21 ng/mL — ABNORMAL LOW (ref 24–336)

## 2024-05-02 NOTE — Cancer Center Note (Signed)
 [place]  [facility]       [facility]       CANCER CENTER NOTE     Date: 05/02/2024  Name: Steven Henderson  MRN: [id number]  Referring Physician: Claudene Krystal LABOR, DO  Primary Care Provider: Krystal LABOR Claudene, DO    REASON FOR VISIT:   Chief Complaint   Patient presents with    Follow Up     Hemochromatosis, unspecified hemochromatosis type          HISTORY OF PRESENT ILLNESS:   73 y.o.male here today for ongoing management of hemochromatosis.  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     His last phlebotomy was in approximately February 2022 which he tolerated well.  He has not needed a phlebotomy since then.  Labs completed 01/25/24 show ferritin 34, iron 149, TIBC 337, and iron saturation 44%.      REVIEW OF SYSTEMS:  Review of Systems   Constitutional:  Positive for fatigue (unchanged) and unexpected weight change (stable now). Negative for appetite change, chills and fever.   HENT:  Negative.     Eyes:  Positive for eye problems.   Respiratory:  Positive for cough (occasionally). Negative for chest tightness and shortness of breath.    Cardiovascular:  Negative for chest pain, leg swelling and palpitations.   Gastrointestinal:  Positive for abdominal pain (intermittently to the lower abdomen that is described as gnawing pain worse in the am with some nausea), constipation and nausea (in the am last couple months). Negative for blood in stool, diarrhea and vomiting.   Genitourinary:  Positive for nocturia. Negative for difficulty urinating and hematuria.    Musculoskeletal:  Positive for arthralgias and back pain.   Skin: Negative.    Neurological:  Positive for dizziness (occasionally), light-headedness (occasionally) and numbness (fingers).   Hematological:  Negative for adenopathy. Bruises/bleeds easily.   Psychiatric/Behavioral:  Negative for depression and sleep disturbance. The patient is not nervous/anxious.          PAST MEDICAL  HISTORY:  Past Medical History:   Diagnosis Date    Anxiety state     BPH (benign prostatic hyperplasia)     Depression, acute     Diabetes mellitus, type 2     Elevated PSA     Generalized OA     Hemochromatosis     Hyperlipidemia     Hypertension     Hypomagnesemia     Insomnia     Syncope     Testicular hypofunction     Vitamin D deficiency      MEDICATIONS:  Current Outpatient Medications   Medication Sig    amLODIPine  (NORVASC ) 10 mg Oral Tablet TAKE (1) TABLET DAILY    aspirin  (ECOTRIN) 81 mg Oral Tablet, Delayed Release (E.C.) Take 1 Tablet (81 mg total) by mouth Daily    finasteride  (PROSCAR ) 5 mg Oral Tablet Take 1 Tablet (5 mg total) by mouth Daily    glimepiride  (AMARYL ) 4 mg Oral Tablet Take 1 Tablet (4 mg total) by mouth Twice daily    hydroCHLOROthiazide  (HYDRODIURIL ) 50 mg Oral Tablet Take 1 Tablet (50 mg total) by mouth Daily    ondansetron  (ZOFRAN  ODT) 8 mg Oral Tablet, Rapid Dissolve Place 1 Tablet (8 mg total) under the tongue Every 8 hours as needed for Nausea/Vomiting    tamsulosin  (FLOMAX ) 0.4 mg Oral Capsule TAKE (1) CAPSULE DAILY IN THE EVENING.    telmisartan  (MICARDIS ) 80 mg Oral Tablet TAKE (1)  TABLET DAILY     ALLERGIES:  No Known Allergies  PAST SURGICAL HISTORY:  Past Surgical History:   Procedure Laterality Date    CYSTOSCOPY  06/23/2017    FINGER SURGERY Left     RING FINGER    HAND SURGERY Right     HX APPENDECTOMY       SOCIAL HISTORY:  Social History     Tobacco Use   Smoking Status Never    Passive exposure: Never   Smokeless Tobacco Never      E-Cigarette Vaping History:  E-Cigarette/Vaping Use: Never User    Substances vaped:  Nicotine: No  CBD: No    Additional information:  Disposable: No  Refillable Tank: No     FAMILY HISTORY:  Family Medical History:       Problem Relation (Age of Onset)    Liver Cancer Father    Stroke Mother          PHYSICAL EXAMINATION:    Vitals:    05/02/24 0916   BP: 137/73   Pulse: 83   Temp: 36.9 C (98.5 F)   TempSrc: Temporal   SpO2: 100%    Weight: 91.6 kg (201 lb 14.4 oz)   Height: 1.702 m (5' 7)   BMI: 31.62     Body mass index is 31.62 kg/m.    ECOG PS 0    Physical Exam  Vitals reviewed.   Constitutional:       General: He is not in acute distress.     Appearance: Normal appearance. He is not ill-appearing.   HENT:      Head: Normocephalic and atraumatic.      Right Ear: External ear normal.      Left Ear: External ear normal.      Nose: Nose normal.      Mouth/Throat:      Mouth: Mucous membranes are moist.      Pharynx: Oropharynx is clear.   Eyes:      General: No scleral icterus.        Right eye: No discharge.         Left eye: No discharge.      Conjunctiva/sclera: Conjunctivae normal.   Cardiovascular:      Rate and Rhythm: Normal rate.   Pulmonary:      Effort: Pulmonary effort is normal. No respiratory distress.   Musculoskeletal:         General: Normal range of motion.      Cervical back: Normal range of motion and neck supple. No rigidity.      Right lower leg: No edema.      Left lower leg: No edema.   Lymphadenopathy:      Upper Body:      Right upper body: No supraclavicular adenopathy.      Left upper body: No supraclavicular adenopathy.   Skin:     General: Skin is warm and dry.      Coloration: Skin is not jaundiced or pale.      Findings: No erythema.   Neurological:      General: No focal deficit present.      Mental Status: He is alert and oriented to person, place, and time.      Motor: No weakness.      Gait: Gait normal.   Psychiatric:         Mood and Affect: Mood normal.        Diagnostic Data Reviewed    Liver  Biopsy dated 02/23/2006-  Diffuse fatty infiltration with focal deposits of intracytoplasmic iron granules suggestive of hemochromatosis.  No evidence of cirrhosis.       LABORATORY:  CBC  Diff     CBC  Diff   Lab Results   Component Value Date/Time    WBC 5.8 05/02/2024 09:33 AM    HGB 14.9 05/02/2024 09:33 AM    HCT 42.4 05/02/2024 09:33 AM    PLTCNT 153 05/02/2024 09:33 AM    RBC 4.68 05/02/2024 09:33 AM     MCV 90.6 05/02/2024 09:33 AM    MCHC 35.3 05/02/2024 09:33 AM    MCH 31.9 05/02/2024 09:33 AM    RDW 12.7 05/02/2024 09:33 AM    MPV 10.6 05/02/2024 09:33 AM    Lab Results   Component Value Date/Time    PMNS 69 05/02/2024 09:33 AM    LYMPHOCYTES 19 05/02/2024 09:33 AM    EOSINOPHIL 1 05/02/2024 09:33 AM    MONOCYTES 10 05/02/2024 09:33 AM    BASOPHILS 1 05/02/2024 09:33 AM    BASOPHILS 0.10 05/02/2024 09:33 AM    PMNABS 4.00 05/02/2024 09:33 AM    LYMPHSABS 1.10 05/02/2024 09:33 AM    EOSABS 0.10 05/02/2024 09:33 AM    MONOSABS 0.50 05/02/2024 09:33 AM            Comprehensive Metabolic Profile    Lab Results   Component Value Date    SODIUM 139 03/24/2024    POTASSIUM 4.6 03/24/2024    CHLORIDE 107 03/24/2024    CO2 27 03/24/2024    ANIONGAP 5 03/24/2024    BUN 20 03/24/2024    CREATININE 1.13 03/24/2024    ALBUMIN 4.1 02/25/2024    CALCIUM 10.0 03/24/2024    GLUCOSENF 220 (H) 03/24/2024    ALKPHOS 46 02/25/2024    ALT 31 02/25/2024    AST 24 02/25/2024    TOTBILIRUBIN 0.6 02/25/2024    TOTALPROTEIN 7.2 02/25/2024         ESTIMATED GFR   Date Value Ref Range Status   03/24/2024 69 >59 mL/min/1.73m2 Final       IRON   Date Value Ref Range Status   01/25/2024 149 50 - 212 ug/dL Final     FERRITIN   Date Value Ref Range Status   01/25/2024 34 24 - 336 ng/mL Final     TOTAL IRON BINDING CAPACITY   Date Value Ref Range Status   01/25/2024 337 250 - 450 ug/dL Final     UIBC   Date Value Ref Range Status   01/25/2024 188 130 - 375 ug/dL Final     IRON (TRANSFERRIN) SATURATION   Date Value Ref Range Status   01/25/2024 44 20 - 50 % Final     VITAMIN B 12   Date Value Ref Range Status   10/14/2021 720 180 - 914 pg/mL Final        03/08/24 PSA 1.74    IMAGING:    05/13/2023 US  Liver  No focal hepatic mass is identified. The liver again exhibits an overall increase in echogenicity. No evidence of cholelithiasis is seen.    11/18/23 US  Gallbladder: FATTY LIVER. NO CHOLELITHIASIS OR BILIARY DILATION     11/18/23 HIDA scan:  Gallbladder Ejection Fraction: 70 % (Normal is >35%)       ASSESSMENT:   1.  Hereditary hemochromatosis with combined heterozygosity C282Y/H63D;  Labs on 01/25/24 show normal CBC and iron panel.  Last therapeutic phlebotomy approximately 2022, per patient.  01/25/24 Ferritin 34, TIBC 337, iron 149, iron saturation 44%  05/02/24 Labs pending     PLAN:  All relative external and internal medical records were reviewed including available H&Ps, progress notes, procedure notes, imaging's, laboratories, and pathology  Continue with labs every 3-4 months. Standing order placed.    Patient instructed to avoid iron containing supplements.    Follow up in 6 months or sooner if needed.  Will call patient with today's results.    Continue to follow  up with PCP as scheduled.     The patient was given the opportunity to ask questions, and these were answered to their satisfaction. They are welcome to call with any questions or concerns at any point.      On the day of the encounter, I spent a total of 30 minutes on this patient encounter including review of historical information, examination, documentation and post-visit activities.     Romualdo Moose APRN, FNP-BC 12/16/202510:15

## 2024-05-05 ENCOUNTER — Other Ambulatory Visit (INDEPENDENT_AMBULATORY_CARE_PROVIDER_SITE_OTHER): Payer: Self-pay | Admitting: Internal Medicine

## 2024-05-05 DIAGNOSIS — E1159 Type 2 diabetes mellitus with other circulatory complications: Secondary | ICD-10-CM

## 2024-05-05 DIAGNOSIS — F411 Generalized anxiety disorder: Secondary | ICD-10-CM

## 2024-06-16 ENCOUNTER — Other Ambulatory Visit (INDEPENDENT_AMBULATORY_CARE_PROVIDER_SITE_OTHER): Payer: Self-pay | Admitting: Internal Medicine

## 2024-07-25 ENCOUNTER — Ambulatory Visit (INDEPENDENT_AMBULATORY_CARE_PROVIDER_SITE_OTHER): Payer: Self-pay | Admitting: NURSE PRACTITIONER

## 2024-08-01 ENCOUNTER — Ambulatory Visit (INDEPENDENT_AMBULATORY_CARE_PROVIDER_SITE_OTHER): Payer: Self-pay

## 2024-09-13 ENCOUNTER — Ambulatory Visit (INDEPENDENT_AMBULATORY_CARE_PROVIDER_SITE_OTHER): Payer: Self-pay

## 2024-11-07 ENCOUNTER — Ambulatory Visit (INDEPENDENT_AMBULATORY_CARE_PROVIDER_SITE_OTHER): Admitting: NURSE PRACTITIONER
